# Patient Record
Sex: Female | Born: 1965
Health system: Southern US, Community
[De-identification: ages and names within clinical notes are randomized; demographics above are authoritative.]

## PROBLEM LIST (undated history)

## (undated) DIAGNOSIS — N189 Chronic kidney disease, unspecified: Secondary | ICD-10-CM

## (undated) DIAGNOSIS — D219 Benign neoplasm of connective and other soft tissue, unspecified: Secondary | ICD-10-CM

## (undated) DIAGNOSIS — M1990 Unspecified osteoarthritis, unspecified site: Secondary | ICD-10-CM

## (undated) DIAGNOSIS — E559 Vitamin D deficiency, unspecified: Secondary | ICD-10-CM

## (undated) DIAGNOSIS — O039 Complete or unspecified spontaneous abortion without complication: Secondary | ICD-10-CM

## (undated) HISTORY — DX: Vitamin D deficiency, unspecified: E55.9

## (undated) HISTORY — DX: Unspecified osteoarthritis, unspecified site: M19.90

## (undated) HISTORY — DX: Benign neoplasm of connective and other soft tissue, unspecified: D21.9

## (undated) HISTORY — DX: Chronic kidney disease, unspecified: N18.9

## (undated) HISTORY — DX: Complete or unspecified spontaneous abortion without complication: O03.9

---

## 2000-07-06 ENCOUNTER — Encounter: Admission: RE | Admit: 2000-07-06 | Discharge: 2000-07-06 | Payer: Self-pay | Admitting: Family Medicine

## 2000-08-20 ENCOUNTER — Encounter: Admission: RE | Admit: 2000-08-20 | Discharge: 2000-08-20 | Payer: Self-pay | Admitting: Family Medicine

## 2001-06-09 ENCOUNTER — Encounter: Admission: RE | Admit: 2001-06-09 | Discharge: 2001-06-09 | Payer: Self-pay | Admitting: Family Medicine

## 2004-07-05 ENCOUNTER — Ambulatory Visit: Payer: Self-pay | Admitting: Family Medicine

## 2004-09-19 ENCOUNTER — Ambulatory Visit: Payer: Self-pay | Admitting: Family Medicine

## 2004-10-25 ENCOUNTER — Ambulatory Visit: Payer: Self-pay | Admitting: Family Medicine

## 2007-03-19 ENCOUNTER — Other Ambulatory Visit: Admission: RE | Admit: 2007-03-19 | Discharge: 2007-03-19 | Payer: Self-pay | Admitting: Gynecology

## 2008-04-18 ENCOUNTER — Other Ambulatory Visit: Admission: RE | Admit: 2008-04-18 | Discharge: 2008-04-18 | Payer: Self-pay | Admitting: Gynecology

## 2009-04-20 ENCOUNTER — Encounter: Admission: RE | Admit: 2009-04-20 | Discharge: 2009-04-20 | Payer: Self-pay | Admitting: Gynecology

## 2009-10-11 ENCOUNTER — Ambulatory Visit: Payer: Self-pay | Admitting: Gynecology

## 2009-10-11 ENCOUNTER — Other Ambulatory Visit: Admission: RE | Admit: 2009-10-11 | Discharge: 2009-10-11 | Payer: Self-pay | Admitting: Gynecology

## 2010-05-23 ENCOUNTER — Encounter: Admission: RE | Admit: 2010-05-23 | Discharge: 2010-05-23 | Payer: Self-pay | Admitting: Gynecology

## 2010-10-17 ENCOUNTER — Encounter: Payer: Self-pay | Admitting: Gynecology

## 2010-10-17 ENCOUNTER — Other Ambulatory Visit: Payer: Self-pay | Admitting: Gynecology

## 2010-10-17 ENCOUNTER — Other Ambulatory Visit: Payer: BC Managed Care – PPO

## 2010-10-17 ENCOUNTER — Other Ambulatory Visit (HOSPITAL_COMMUNITY)
Admission: RE | Admit: 2010-10-17 | Discharge: 2010-10-17 | Disposition: A | Discharge status: Home / Self Care | Payer: BC Managed Care – PPO | Source: Ambulatory Visit | Attending: Gynecology | Admitting: Gynecology

## 2010-10-17 ENCOUNTER — Encounter (INDEPENDENT_AMBULATORY_CARE_PROVIDER_SITE_OTHER): Payer: BC Managed Care – PPO | Admitting: Gynecology

## 2010-10-17 DIAGNOSIS — R635 Abnormal weight gain: Secondary | ICD-10-CM

## 2010-10-17 DIAGNOSIS — D259 Leiomyoma of uterus, unspecified: Secondary | ICD-10-CM

## 2010-10-17 DIAGNOSIS — Z833 Family history of diabetes mellitus: Secondary | ICD-10-CM

## 2010-10-17 DIAGNOSIS — Z124 Encounter for screening for malignant neoplasm of cervix: Secondary | ICD-10-CM | POA: Insufficient documentation

## 2010-10-17 DIAGNOSIS — Z13220 Encounter for screening for lipoid disorders: Secondary | ICD-10-CM

## 2010-10-17 DIAGNOSIS — Z01419 Encounter for gynecological examination (general) (routine) without abnormal findings: Secondary | ICD-10-CM

## 2011-03-28 ENCOUNTER — Ambulatory Visit (INDEPENDENT_AMBULATORY_CARE_PROVIDER_SITE_OTHER): Payer: BC Managed Care – PPO | Admitting: Gynecology

## 2011-03-28 ENCOUNTER — Encounter: Payer: Self-pay | Admitting: Anesthesiology

## 2011-03-28 ENCOUNTER — Encounter: Payer: Self-pay | Admitting: Gynecology

## 2011-03-28 VITALS — BP 118/70

## 2011-03-28 DIAGNOSIS — B373 Candidiasis of vulva and vagina: Secondary | ICD-10-CM

## 2011-03-28 DIAGNOSIS — Z113 Encounter for screening for infections with a predominantly sexual mode of transmission: Secondary | ICD-10-CM

## 2011-03-28 DIAGNOSIS — R300 Dysuria: Secondary | ICD-10-CM

## 2011-03-28 DIAGNOSIS — R82998 Other abnormal findings in urine: Secondary | ICD-10-CM

## 2011-03-28 DIAGNOSIS — N390 Urinary tract infection, site not specified: Secondary | ICD-10-CM | POA: Insufficient documentation

## 2011-03-28 MED ORDER — FLUCONAZOLE 150 MG PO TABS: 150.0000 mg | ORAL_TABLET | Freq: Once | ORAL | Status: AC

## 2011-03-28 MED ORDER — NITROFURANTOIN MONOHYD MACRO 100 MG PO CAPS: 100.0000 mg | ORAL_CAPSULE | Freq: Two times a day (BID) | ORAL | Status: AC

## 2011-03-28 NOTE — Progress Notes (Signed)
The patient presented to the office today for complaining of thickened at 2-3 day history of urinary dysuria but no true frequency she denied any fever chills nausea or vomiting. She also has some concerns about her being exposed sexually to her husband who travels a lot and wanted tablets STD screening.  Abdominal exam: Soft slight suprapubic tenderness Pelvic: Bartholin urethra Skene was within normal limits Vagina: No gross lesions on inspection Cervix: No gross lesions on inspection Bimanual exam: Irregular shaped uterus with a subserosal myoma on the right uterine sidewall which had been confirmed on ultrasound 10/17/2010 Rectal exam: Not done  Urinalysis demonstrated 46 WBC 2+ bacteria in 6-8 RBC and her wet prep demonstrated evidence of moniliasis.  GC and chlamydia culture pending at time of this dictation  Prescription for Macrobid 1 tablet by mouth twice a day for 7 days and also samples of uribell 1 tablet 4 times a day for 2 days as an anti-spasmodic agent was provided. And a prescription for Diflucan 150 mg one tablet by mouth today.

## 2011-03-28 NOTE — Patient Instructions (Addendum)
Tomar Uribel de 3-4 veces al dai. El antibiotico Macrobid te tomas [employer] veces al dia por una semana. Si no te llamamos el lunes quiere decir que los cultivos estaban negativo. La tableta de diflucam te tomas una hoy para el hongo.

## 2011-03-28 NOTE — Progress Notes (Signed)
Addended by: Cammie Mcgee T on: 03/28/2011 04:35 PM   Modules accepted: Orders

## 2011-04-04 ENCOUNTER — Ambulatory Visit (INDEPENDENT_AMBULATORY_CARE_PROVIDER_SITE_OTHER): Payer: BC Managed Care – PPO | Admitting: Gynecology

## 2011-04-04 ENCOUNTER — Encounter: Payer: Self-pay | Admitting: Gynecology

## 2011-04-04 VITALS — BP 118/70

## 2011-04-04 DIAGNOSIS — R300 Dysuria: Secondary | ICD-10-CM

## 2011-04-04 NOTE — Progress Notes (Signed)
The patient presented in the office today for a test of cure after her urinary tract infection which was diagnosed at her last office visit she had been placed on Macrobid 1 tablet twice a day for 7 day course and had been given a Uribel as an anti-spasmodic agent 1 tablet 4 times a day for 2 days. She has one day left of the antibiotic which demonstrated her to finish she is almost completely asymptomatic her urinalysis today was negative and she is otherwise scheduled to return back in February the next year for her annual exam or when necessary. She was instructed also to schedule her mammogram which is due next month. Instructions were provided in Spanish.

## 2011-04-04 NOTE — Patient Instructions (Signed)
Recuerdate de hacer cita para la mammografia. Nos vemos en Feb 2013 para examen annual.

## 2011-04-22 ENCOUNTER — Other Ambulatory Visit: Payer: Self-pay | Admitting: Gynecology

## 2011-04-22 DIAGNOSIS — Z1231 Encounter for screening mammogram for malignant neoplasm of breast: Secondary | ICD-10-CM

## 2011-04-23 ENCOUNTER — Encounter: Payer: Self-pay | Admitting: Gynecology

## 2011-04-23 ENCOUNTER — Ambulatory Visit (INDEPENDENT_AMBULATORY_CARE_PROVIDER_SITE_OTHER): Payer: BC Managed Care – PPO | Admitting: Gynecology

## 2011-04-23 VITALS — BP 134/80

## 2011-04-23 DIAGNOSIS — Z309 Encounter for contraceptive management, unspecified: Secondary | ICD-10-CM

## 2011-04-23 NOTE — Progress Notes (Signed)
Patient is a 45 year old gravida 3 para 2 AB 1 who presented to the office today for discussion of different contraceptive options. She had been using the rhythm method but since her cycles are occurring anywhere between 21-35 days she feels uncomfortable and is afraid of getting pregnant. Her Pap smear was in February of this year which was normal.  We went through an extensive discussion on the different methods of contraception to include the following: Nexplanon, Depo-Provera injection, NuvaRing, oral contraceptive pills and the 2 types of IUDs. The risks benefits and pros and cons of each were discussed in detail. Patient is in a monogamous relationship and would like to proceed with a Mirena IUD which she had many years ago. Literature information was provided we'll order one for her and she will call the office as soon as her period starts so we can insert it. All the above was discussed with the patient Spanish in detail all questions were answered and we'll follow accordingly.

## 2011-04-29 ENCOUNTER — Telehealth: Payer: Self-pay | Admitting: Anesthesiology

## 2011-04-29 NOTE — Telephone Encounter (Signed)
Left message

## 2011-05-01 NOTE — Telephone Encounter (Signed)
Dr. Lily Peer I spoke with patient and informed her that her insurance did not cover the mirena iud.Marland Kitchen i told her that you could Rx her a pill, she does not like the pill and said she discussed Depo Provera with you and would prefer that.Marland Kitchen

## 2011-05-01 NOTE — Telephone Encounter (Signed)
Autumn Haley, please notify patient that I have reviewed her record and we did discuss about the Depo-Provera injectable contraception and other options as well. Since she has decided to proceed with the Depo-Provera injectable contraception we'll go ahead and have her come to the office when she starts her period. Have her call the office with her period starts so we can proceed and given of the Depo-Provera injectable contraception which is 150 mg administered IM every 3 months.

## 2011-05-30 ENCOUNTER — Ambulatory Visit
Admission: RE | Admit: 2011-05-30 | Discharge: 2011-05-30 | Disposition: A | Discharge status: Home / Self Care | Payer: BC Managed Care – PPO | Source: Ambulatory Visit | Attending: Gynecology | Admitting: Gynecology

## 2011-05-30 DIAGNOSIS — Z1231 Encounter for screening mammogram for malignant neoplasm of breast: Secondary | ICD-10-CM

## 2011-06-06 ENCOUNTER — Other Ambulatory Visit: Payer: Self-pay | Admitting: *Deleted

## 2011-06-06 ENCOUNTER — Ambulatory Visit: Payer: BC Managed Care – PPO

## 2011-06-06 DIAGNOSIS — IMO0001 Reserved for inherently not codable concepts without codable children: Secondary | ICD-10-CM

## 2011-06-06 MED ORDER — MEDROXYPROGESTERONE ACETATE 150 MG/ML IM SUSP: 150.0000 mg | Freq: Once | INTRAMUSCULAR | Status: DC

## 2011-07-04 ENCOUNTER — Ambulatory Visit (INDEPENDENT_AMBULATORY_CARE_PROVIDER_SITE_OTHER): Payer: BC Managed Care – PPO | Admitting: Anesthesiology

## 2011-07-04 ENCOUNTER — Encounter: Payer: Self-pay | Admitting: Anesthesiology

## 2011-07-04 DIAGNOSIS — Z309 Encounter for contraceptive management, unspecified: Secondary | ICD-10-CM

## 2011-07-04 MED ORDER — MEDROXYPROGESTERONE ACETATE 150 MG/ML IM SUSP
150.0000 mg | Freq: Once | INTRAMUSCULAR | Status: AC
  Administered 2011-07-04: 150 mg via INTRAMUSCULAR

## 2011-08-08 ENCOUNTER — Ambulatory Visit (INDEPENDENT_AMBULATORY_CARE_PROVIDER_SITE_OTHER): Payer: BC Managed Care – PPO

## 2011-08-08 DIAGNOSIS — B373 Candidiasis of vulva and vagina: Secondary | ICD-10-CM

## 2011-08-28 ENCOUNTER — Other Ambulatory Visit: Payer: Self-pay | Admitting: Gynecology

## 2011-08-28 ENCOUNTER — Ambulatory Visit (INDEPENDENT_AMBULATORY_CARE_PROVIDER_SITE_OTHER): Payer: BC Managed Care – PPO | Admitting: Gynecology

## 2011-08-28 ENCOUNTER — Encounter: Payer: Self-pay | Admitting: Gynecology

## 2011-08-28 VITALS — BP 120/82

## 2011-08-28 DIAGNOSIS — D259 Leiomyoma of uterus, unspecified: Secondary | ICD-10-CM

## 2011-08-28 DIAGNOSIS — D219 Benign neoplasm of connective and other soft tissue, unspecified: Secondary | ICD-10-CM

## 2011-08-28 DIAGNOSIS — R300 Dysuria: Secondary | ICD-10-CM

## 2011-08-28 LAB — URINALYSIS, ROUTINE W REFLEX MICROSCOPIC
Bilirubin Urine: NEGATIVE
Ketones, ur: NEGATIVE mg/dL
Nitrite: NEGATIVE
Protein, ur: NEGATIVE mg/dL
Specific Gravity, Urine: 1.02 (ref 1.005–1.030)
Urobilinogen, UA: 0.2 mg/dL (ref 0.0–1.0)

## 2011-08-28 LAB — URINALYSIS, MICROSCOPIC ONLY
Bacteria, UA: NONE SEEN
Crystals: NONE SEEN
RBC / HPF: NONE SEEN RBC/hpf (ref <3)

## 2011-08-28 NOTE — Patient Instructions (Signed)
La semana que viene hacer ultrasonido. Mandamos cultivo hoy.

## 2011-08-28 NOTE — Progress Notes (Signed)
Patient presented to the office today stating that we could go she was at the urgent care and was treated for suspected urinary tract infection with Macrobid for one week. She denies any fever chills nausea or vomiting she states discomfort is right when she finishes voiding regardless she wipes herself were not. She is in a monogamous relationship has not had intercourse for 2 weeks. She denies any frequency.  Urinalysis today was negative with the exception of trace blood in her urine. We will send a urine for culture.  Patient with history of fibroid uterus February of last year her ultrasound demonstrated she had a uterus that measured 11 x 6.5 x 5 cm and had several intramural myomas and a large subserosal myoma measuring 6.6 x 5.0 x 5.8 cm ovaries are otherwise normal  Pelvic: Bartholin urethra Skene glands within normal limits Vagina: No gross lesions on inspection Cervix: No gross lesions on inspection Uterus: 12 weeks size irregularities attributed to her fibroid uterus more on the right pelvic sidewall region. Adnexa difficult to assess due to the size of the fibroids.  Assessment: Micro hematuria recently treated for urinary tract infection urethral tenderness for/irritation at termination of voiding. History of fibroid uterus irregular right adnexal region on exam today. Patient will return back next week for ultrasound. We'll send a urine for culture today. Samples of Uribell anti-spasmodic was provided her to take 1 tablet 3 times a day for 3 days. If her symptoms continue she returned back next week and her fibroid uterus in stable she'll be referred to the urologist for possible cystoscopic evaluation.

## 2011-08-30 LAB — URINE CULTURE
Colony Count: NO GROWTH
Organism ID, Bacteria: NO GROWTH

## 2011-09-03 NOTE — Progress Notes (Signed)
Addended by: Bertram Savin A on: 09/03/2011 04:38 PM   Modules accepted: Orders

## 2011-09-05 ENCOUNTER — Ambulatory Visit: Payer: BC Managed Care – PPO | Admitting: Gynecology

## 2011-09-05 ENCOUNTER — Ambulatory Visit (INDEPENDENT_AMBULATORY_CARE_PROVIDER_SITE_OTHER): Payer: BC Managed Care – PPO | Admitting: Gynecology

## 2011-09-05 ENCOUNTER — Ambulatory Visit (INDEPENDENT_AMBULATORY_CARE_PROVIDER_SITE_OTHER): Payer: BC Managed Care – PPO

## 2011-09-05 ENCOUNTER — Other Ambulatory Visit: Payer: BC Managed Care – PPO

## 2011-09-05 ENCOUNTER — Encounter: Payer: Self-pay | Admitting: Gynecology

## 2011-09-05 VITALS — BP 132/86

## 2011-09-05 DIAGNOSIS — IMO0001 Reserved for inherently not codable concepts without codable children: Secondary | ICD-10-CM

## 2011-09-05 DIAGNOSIS — N760 Acute vaginitis: Secondary | ICD-10-CM

## 2011-09-05 DIAGNOSIS — Z309 Encounter for contraceptive management, unspecified: Secondary | ICD-10-CM

## 2011-09-05 DIAGNOSIS — D259 Leiomyoma of uterus, unspecified: Secondary | ICD-10-CM

## 2011-09-05 DIAGNOSIS — D219 Benign neoplasm of connective and other soft tissue, unspecified: Secondary | ICD-10-CM

## 2011-09-05 DIAGNOSIS — R300 Dysuria: Secondary | ICD-10-CM

## 2011-09-05 LAB — URINALYSIS W MICROSCOPIC + REFLEX CULTURE
Glucose, UA: NEGATIVE mg/dL
Hgb urine dipstick: NEGATIVE
Leukocytes, UA: NEGATIVE
Nitrite: NEGATIVE
Protein, ur: NEGATIVE mg/dL
Urobilinogen, UA: 0.2 mg/dL (ref 0.0–1.0)

## 2011-09-05 MED ORDER — NORETHIN ACE-ETH ESTRAD-FE 1-20 MG-MCG PO TABS: 1.0000 | ORAL_TABLET | Freq: Every day | ORAL | Status: DC

## 2011-09-05 NOTE — Progress Notes (Signed)
Patient presented to the office today for discussion of her ultrasound. Patient was seen in the office on January 3 she had been evaluated and treated in the urgent care for suspected urinary tract infection. She continued to have discomfort at the termination of urination but no dysuria per say and some irritation the outside of the urethra. She is in a monogamous relationship. Recent urinalysis and urine culture in the office were negative during exam there was a questionable irregularity in the right adnexa so she was asked to come back today for an ultrasound and plan a course of management.  Ultrasound demonstrated uterus measured 11.5 x 10.8 x 7.7 cm endometrial stripe 7.3 mm. For small fibroids less than 2 half centimeters in size was noted. Ovaries otherwise normal.  No disposable recent for patient's symptoms with the exception of vaginitis attributed to the Depo-Provera. We are going to place her on Premarin cream to apply to her finger and applied to the distal urethra and every night for the next 2 weeks. We'll switch her over to low dose oral contraceptive pill starting with her next cycle. Risk benefits and pros and cons were discussed. Patient denied any history of bleeding disorders in her family. If her symptoms continue after this changes we'll refer the urologist for cystoscopic evaluation. Patient is interested in the IUD is to wait and she states a minute because it would be a pocket since her insurance wouldn't cover. Literature information on the above was provided in Spanish all questions were answered and we'll follow accordingly.

## 2011-09-05 NOTE — Patient Instructions (Signed)
Usar crema de premarin todas las noches un poquito en el dedo haste que se te acabe. Esperar que [employer] periodo proximo para Corporate investment banker la pastilla anticonceptiva (empezar el segundo dia del periodo). Tomar pastilla a la [employer]  .Anticonceptivos orales (Oral Contraceptives) Los anticonceptivos orales son medicamentos que se utilizan para Location manager. Es el mtodo reversible ms ampliamente utilizado. Su funcin es [employer] ovarios liberen vulos. Las hormonas de los anticonceptivos orales hacen que el moco cervical se haga ms espeso, lo que evita que el esperma ingrese al tero. Tambin hacen que la membrana que tapiza el tero se vuelva ms fina, lo que no permite que el huevo fertilizado se adhiera a la pared del tero. Cuando se toman exactamente como se prescriben, el porcentaje de fracaso es entre el 1% y el 3%  menos. HAY DOS TIPOS DE ANTICONCEPTIVOS [employer]  Los que contienen una mezcla de estrgenos y progesterona son los ms comunes. Se toman durante 21 das seguidos y se suspenden por 7 [address]  [address]. Vienen en envases de 28 pldoras, y 7 de ellas son incactivas. Debe tomar [place] da. Por lo tanto no necesita recordar cundo Tax adviser a comenzar a Hydrologist. La mayora de las mujeres tendrn su perodo menstrual 2  3 das despus de tomar la pldora de hormonas. El perodo menstrual generalmente se hace menos abundante y ms breve. No debe tomarlos si est amamantando.   Los anticonceptivos que slo contienen progesterona (minipldoras) no contienen estrgenos. Deben tomarse [employer]. Podr ser que el perodo menstrual sea de slo una pequea mancha o no tenga perodo en absoluto.. Si est amamantando podr tomar aquellas pldoras anticonceptivas que contengan slo progesterona.  Los anticonceptivos orales se presentan en:  Envases de 21 pldoras, sin pldoras inactivas para tomar [employer] 7 [employer] Disney.   Envases de 28 pldoras, para  tomar Management consultant. Las ltimas 7 no contienen hormonas.   Envases de 91 pldoras (uso continuo o extendido) para tomar una pldora por da. Las primeras 84 contienen hormonas y las 7 ltimas no contienen. En este momento tendr su perodo menstrual. No tendr su perodo durante el tiempo en que tome las primeras 84 pldoras.  COMO TOMAR LOS ANTICONCEPTIVOS ORALES El mdico le indicar como comenzar a Surveyor, minerals ciclo de anticonceptivos orales. De lo contrario usted puede:  Comenzar con Company secretary de anticonceptivos Architect 1 o el da 5 de su ciclo menstrual, No necesitar proteccin anticonceptiva adicional al Investment banker, operational.   Comience el primer domingo, o el da 7 luego de su perodo menstrual, o Medical laboratory scientific officer en que adquiere el [employer]. En estos casos deber [employer] proteccin anticonceptiva adicional durante Tax inspector.  No importa cuando comience a tomar los anticonceptivos, siempre empiece un nuevo envase el mismo da de la [place]. Es Neomia Dear buena idea tener un envase extra de pldoras anticonceptivas y un anticonceptivo adicional para el caso en que se olvide de tomar algunas pldoras o pierda la caja. MOTIVOS FRECUENTES PARA EL FRACASO  Olvido de tomar la [employer] a la misma hora.   Escasa absorcin del medicamento desde el estmago hacia el torrente sanguneo. El motivo puede ser que haya sufrido diarrea, vmitos o el uso de algunos medicamentos para combatir grmenes (antibiticos).   Trastornos estomacales o intestinales.   El consumo de la pldora con otros medicamentos que pueden disminuir su efectividad, como [place], [place],  fenobarbital y rifampicina.   Uso de anticonceptivos que han pasado su fecha de vencimiento.   Cuando se [place] el envase de [place], se olvida recomenzar el uso en el da 7.  Si se olvida de tomar [employer], tmela tan pronto como lo recuerde, y tome la siguiente en el momento correspondiente. Si olvida  tomar 2  ms pldoras, utilice un mtodo anticonceptivo adicional cuando comience su prximo perodo menstrual. Adems, puede tener una pequea prdida de sangre o hemorragia si olvida tomar 2  ms pldoras. Si utiliza el envase de 28  91 pldoras y [place] tomar 1 de las ltimas 7 (pldoras sin hormonas), no tiene Quarry manager. Simplemente deseche el resto de las pldoras que no contienen hormonas y comience un nuevo envase de 28  91 pldoras. USOS COMUNES DE LOS ANTICONCEPTIVOS ORALES  Disminucin de los sntomas (problemas) premenstruales.   Alivio de los clicos durante el perodo menstrual.   Evitar un Psychiatrist.   Regulacin del ciclo menstrual.   Tratamiento para el acn.   Disminuyen el flujo menstrual excesivo.   Tratamiento de las hemorragias uterinas disfuncionales (anormales).   Dolor plvico crnico.   Tratamiento del sndrome del ovario poliqustico (el ovario no produce vulos y forma pequeos quistes).   Tratamiento de la endometriosis (tejido que tapiza internamente al tero ubicado patolgicamente en la pelvis, los ovarios, tero y trompas).   Los anticonceptivos orales pueden utilizarse como anticonceptivos de Associate Professor.  Los anticonceptivos orales NO previenen contra las enfermedades de trasmisin sexual. La prctica del sexo seguro, como el uso de preservativos, junto con la pldora, [place] a prevenir ese tipo de enfermedades.  BENEFICIOS  Reducen el riesgo de:   Cncer de ovario y tero.   Quiste ovrico   Infecciones plvicas   Sntomas del sndrome ovrico poliqustico   Prdida de masa sea (osteoporosis).   Enfermedades mamarias no cancerosas (benignas) .   Dficit de glbulos rojos (anemia) debido a perodos menstruales abundantes o prolongados.   Embarazo ectpico (embarazo fuera del tero).   Acn.   Disminuye el flujo de los perodos abundantes.   En algunos casos mejora el sndrome premenstrual.   Alivia los clicos y Chief Technology Officer.    Controla los perodos [employer].   Pueden usarse como anticonceptivos de emergencia  USTED NO DEBE TOMAR LA PLDORA SI:  Quiere quedar embarazada o lo est.   Usted presenta una hemorragia vaginal que no es normal.   Tiene una historia de enfermedad heptica, ictus o ataque cardaco.   Fuma   Tiene una historia de problemas de coagulacin, cncer o problemas cardacos.   Sufre una enfermedad en la vescula biliar.   Sufre cncer de mama o sospecha tenerlo.   Sufre o sospecha tener cncer plvico.   Tiene la presin arterial elevada.   Tiene niveles elevados de colesterol o triglicridos.   Sufre depresin.   Est amamantando, excepto en el caso que tome anticonceptivos que contengan slo progesterona, con aprobacin del mdico.   Sufre diabetes y tiene complicaciones renales, oculares o en los vasos sanguneos. O si ha tenido diabetes durante 20 aos o ms.   Tiene una enfermedad en las vlvulas cardacas.   Tiene cefaleas migraosas Pueden empeorar.  Antes de tomar la pldora, toda mujer debe hacerse un examen fsico y un [place]. El Ecologist indicar anlisis de sangre para Sales executive nivel de azcar en la sangre, colesterol y otros anlisis de sangre que puedan ser necesarios. ENTRE LOS EFECTOS ADVERSOS DE LA PLDORA SE  INCLUYEN:  Sensibilidad, secrecin y [employer].   Modificaciones en el impulso sexual (aumento o disminucin de la libido).   Depresin.   Ms cansancio.   Dolor de [place].   Ansiedad.   [place] irregular o hemorragia vaginal durante algunos meses.   Dolor en las piernas.   Calambres o hinchazn de los miembros (extremidades).   Trastornos del [place] de nimo.   Prdida o aumento de [place].   Ganas de vomitar (nuseas).   Modificaciones en el apetito.   Prdida del cabello   Infeccin vaginal por hongos   Nerviosismo   Erupcin   Acn   Falta de perodo menstrual (amenorrea)  Al comenzar  a Psychologist, occupational, es Agricultural consultant o tres meses para que el organismo se adapte (antes de suspender el uso debido a Architectural technologist secundario). Esto permite adaptarse a los [employer]. Si una mujer contina con los efectos secundarios, podr Multimedia programmer a otro tipo de anticonceptivos orales. Es importante que le comente los trastornos al profesional que la asiste. Con frecuencia, el cambio a una pldora diferente hace que los efectos secundarios disminuyan. RIESGOS Y COMPLICACIONES  Cogulos en las piernas, corazn, pulmones o cerebro.   Presin arterial elevada.   Enfermedad en la vescula biliar.   Cncer de hgado   Sangrado cerebral (Hemorragias )   Ligero aumento en el riesgo de sufrir cncer de mama.  INSTRUCCIONES PARA EL CUIDADO DOMICILIARIO  No fume.   Utilice los medicamentos de venta libre o de prescripcin para Chief Technology Officer, Environmental health practitioner o molestias en las mamas segn se lo indique el mdico.   Siempre use un condn para protegerse de las enfermedades de transmisin sexual. Los anticonceptivos orales no protegen contra las enfermedades de transmisin sexual.   Research scientist (medical) en un calendario las fechas en las que tiene sus perodos [employer].  Las indicaciones, los tipos y las dosis varan continuamente. Comente sus elecciones al mdico y decida qu es lo mejor para usted. Siempre hay excepciones a las normas. Lea siempre la informacin que viene en el envase y controle si hay nuevas recomendaciones o normas. SOLICITE ATENCIN MDICA SI:  Presenta nuseas o vmitos.   Brett Fairy hemorragia vaginal anormal.   Necesita tratamiento por sus cefaleas.   Aparece una erupcin cutnea.   No tiene perodo menstrual.   Presenta hemorragia vaginal anormal.   Pierde el cabello.   Necesita tratamiento para los cambios en el estado de nimo o la depresin.   Se marea al tomarlos.   Comienza a aparecer acn.  SOLICITE ATENCIN MDICA DE  INMEDIATO SI:  Siente dolor en las piernas.   Siente dolor en el pecho.   Le falta el aire.   Presenta dolor abdominal.   Le duele la cabeza y no se calma.   Presenta adormecimiento o dificultad en el habla.   Tiene problemas para ver, (prdida de la visin, visin borrosa, o visin doble).   Presenta una hemorragia vaginal abundante.  Si est tomando la pldora, SUSPNDALA INMEDIATAMENTE y COMUNQUESE CON SU MDICO si sufre alguna de las siguientes situaciones:  Siente falta de aire o Journalist, newspaper.   Presenta dolor, inflamacin o hinchazn en las piernas.   Presenta dolor de cabeza intenso, trastornos visuales o dolor abdominal (en el vientre).   La depresin es intensa.   Est embarazada.  Document Released: 05/21/2005 Document Revised: 09/13/2010 [employer] Patient Information 2012 [place], [place].

## 2011-09-11 ENCOUNTER — Other Ambulatory Visit: Payer: Self-pay

## 2011-09-11 DIAGNOSIS — R3989 Other symptoms and signs involving the genitourinary system: Secondary | ICD-10-CM

## 2011-09-17 ENCOUNTER — Other Ambulatory Visit: Payer: BC Managed Care – PPO

## 2011-09-17 DIAGNOSIS — R3989 Other symptoms and signs involving the genitourinary system: Secondary | ICD-10-CM

## 2011-09-17 LAB — URINALYSIS W MICROSCOPIC + REFLEX CULTURE
Casts: NONE SEEN
Ketones, ur: NEGATIVE mg/dL
Leukocytes, UA: NEGATIVE
Nitrite: NEGATIVE
Protein, ur: NEGATIVE mg/dL
Urobilinogen, UA: 0.2 mg/dL (ref 0.0–1.0)
pH: 5.5 (ref 5.0–8.0)

## 2011-10-23 ENCOUNTER — Ambulatory Visit (INDEPENDENT_AMBULATORY_CARE_PROVIDER_SITE_OTHER): Payer: BC Managed Care – PPO | Admitting: Gynecology

## 2011-10-23 ENCOUNTER — Encounter: Payer: Self-pay | Admitting: Gynecology

## 2011-10-23 VITALS — BP 146/82 | Ht 60.25 in | Wt 136.0 lb

## 2011-10-23 DIAGNOSIS — Z01419 Encounter for gynecological examination (general) (routine) without abnormal findings: Secondary | ICD-10-CM

## 2011-10-23 DIAGNOSIS — Z3049 Encounter for surveillance of other contraceptives: Secondary | ICD-10-CM

## 2011-10-23 DIAGNOSIS — N912 Amenorrhea, unspecified: Secondary | ICD-10-CM

## 2011-10-23 DIAGNOSIS — Z0000 Encounter for general adult medical examination without abnormal findings: Secondary | ICD-10-CM

## 2011-10-23 DIAGNOSIS — R635 Abnormal weight gain: Secondary | ICD-10-CM

## 2011-10-23 DIAGNOSIS — Z3042 Encounter for surveillance of injectable contraceptive: Secondary | ICD-10-CM

## 2011-10-23 LAB — LIPID PANEL
HDL: 55 mg/dL (ref >39)
LDL Cholesterol: 96 mg/dL (ref 0–99)
Triglycerides: 59 mg/dL (ref <150)

## 2011-10-23 LAB — CBC WITH DIFFERENTIAL/PLATELET
HCT: 42.5 % (ref 36.0–46.0)
Hemoglobin: 14 g/dL (ref 12.0–15.0)
Lymphs Abs: 2.3 10*3/uL (ref 0.7–4.0)
MCH: 29.7 pg (ref 26.0–34.0)
Monocytes Absolute: 0.9 10*3/uL (ref 0.1–1.0)
Monocytes Relative: 11 % (ref 3–12)
Neutro Abs: 5.3 10*3/uL (ref 1.7–7.7)
Neutrophils Relative %: 61 % (ref 43–77)
RBC: 4.72 MIL/uL (ref 3.87–5.11)

## 2011-10-23 LAB — PREGNANCY, URINE: Preg Test, Ur: NEGATIVE

## 2011-10-23 LAB — TSH: TSH: 1.225 u[IU]/mL (ref 0.350–4.500)

## 2011-10-23 LAB — GLUCOSE, RANDOM: Glucose, Bld: 103 mg/dL — ABNORMAL HIGH (ref 70–99)

## 2011-10-23 MED ORDER — MEDROXYPROGESTERONE ACETATE 150 MG/ML IM SUSP
150.0000 mg | Freq: Once | INTRAMUSCULAR | Status: AC
  Administered 2011-10-23: 150 mg via INTRAMUSCULAR

## 2011-10-23 MED ORDER — MEDROXYPROGESTERONE ACETATE 150 MG/ML IM SUSP: 150.0000 mg | INTRAMUSCULAR | Status: DC

## 2011-10-23 NOTE — Progress Notes (Signed)
Addended by: Bertram Savin A on: 10/23/2011 11:05 AM   Modules accepted: Orders

## 2011-10-23 NOTE — Patient Instructions (Signed)
Inyeccion de Provera al final de [employer]

## 2011-10-23 NOTE — Progress Notes (Signed)
Autumn Haley 01/16/1966 [id number]   History:    46 y.o.  for annual exam with only complaint today of not having a menstrual cycle this month but had one twice in January. She received her first Depo-Provera injection November of 2012 and is scheduled for next dose today. She had done several urine pregnancy test at home which were negative. Pregnancy test in the office today was negative as well. She denied nausea or vomiting we headaches or nipple discharge. Patient does her monthly self breast examination her last mammogram was in August of 2012. Last Pap smear was in 2012 which was normal. No prior history of abnormal Pap smears. Patient with known history of fibroid uterus. Ultrasound January 2013 as follows:  Uterus 11.5 x 10.8 x 7.7 cm with multiple fibroids 1 measuring 6.8 x 5.8 cm and the remainder were less than 2 cm. Ovaries were normal. Ultrasound done January 2013.  Past medical history,surgical history, family history and social history were all reviewed and documented in the EPIC chart.  Gynecologic History No LMP recorded. Patient has had an injection. Contraception: Depo-Provera injections Last Pap: 2012. Results were: normal Last mammogram: 2012. Results were:normal}  Obstetric History OB History    Grav Para Term Preterm Abortions TAB SAB Ect Mult Living   3 2 2  1  1   2      # Outc Date GA Lbr Len/2nd Wgt Sex Del Anes PTL Lv   1 TRM     M SVD  No Yes   2 TRM     F SVD  No Yes   3 SAB                ROS:  Was performed and pertinent positives and negatives are included in the history.  Exam: chaperone present  BP 146/82  Ht 5' 0.25" (1.53 m)  Wt 136 lb (61.689 kg)  BMI 26.34 kg/m2  Body mass index is 26.34 kg/(m^2).  General appearance : Well developed well nourished female. No acute distress HEENT: Neck supple, trachea midline, no carotid bruits, no thyroidmegaly Lungs: Clear to auscultation, no rhonchi or wheezes, or rib retractions  Heart:  Regular rate and rhythm, no murmurs or gallops Breast:Examined in sitting and supine position were symmetrical in appearance, no palpable masses or tenderness,  no skin retraction, no nipple inversion, no nipple discharge, no skin discoloration, no axillary or supraclavicular lymphadenopathy Abdomen: no palpable masses or tenderness, no rebound or guarding Extremities: no edema or skin discoloration or tenderness  Pelvic:  Bartholin, Urethra, Skene Glands: Within normal limits             Vagina: No gross lesions or discharge  Cervix: No gross lesions or discharge  Uterus  10-12 weeks size irregular, normal size, shape and consistency, non-tender and mobile  Adnexa  Without masses or tenderness  Anus and perineum  normal   Rectovaginal  normal sphincter tone without palpated masses or tenderness             Hemoccult not done     Assessment/Plan:  46 y.o. female for annual exam reassured that her pregnancy test was negative. She was reassured that her amenorrhea is attributed to her been on Depo-Provera and injectable contraception. She will receive 150 mg of Depo-Provera IM today and return every 3 months if she was to continue this form of contraception. The following labs were drawn today CBC, fasting lipid profile, TSH, urinalysis, along with a fasting blood sugar. New Pap smear  screening guidelines discussed. No Pap smear done today. Patient encouraged to do her monthly self breast examination. She was instructed to take her calcium vitamin D for osteoporosis prevention. She was reminded that in August of this year she needs her followup annual mammogram.    Ok Edwards MD, 10:54 AM 10/23/2011

## 2011-10-24 LAB — URINALYSIS W MICROSCOPIC + REFLEX CULTURE
Bilirubin Urine: NEGATIVE
Casts: NONE SEEN
Crystals: NONE SEEN
Glucose, UA: NEGATIVE mg/dL
Leukocytes, UA: NEGATIVE
pH: 6 (ref 5.0–8.0)

## 2011-11-20 ENCOUNTER — Ambulatory Visit: Payer: BC Managed Care – PPO | Admitting: Gynecology

## 2011-11-20 ENCOUNTER — Ambulatory Visit (INDEPENDENT_AMBULATORY_CARE_PROVIDER_SITE_OTHER): Payer: BC Managed Care – PPO | Admitting: Gynecology

## 2011-11-20 ENCOUNTER — Encounter: Payer: Self-pay | Admitting: Gynecology

## 2011-11-20 DIAGNOSIS — M1990 Unspecified osteoarthritis, unspecified site: Secondary | ICD-10-CM

## 2011-11-20 DIAGNOSIS — M129 Arthropathy, unspecified: Secondary | ICD-10-CM

## 2011-11-20 NOTE — Progress Notes (Signed)
Scheduling error patient did not need to be seen today. Her annual due next year

## 2011-11-30 ENCOUNTER — Ambulatory Visit (INDEPENDENT_AMBULATORY_CARE_PROVIDER_SITE_OTHER): Payer: BC Managed Care – PPO | Admitting: Family Medicine

## 2011-11-30 VITALS — BP 142/82 | HR 64 | Temp 98.4°F | Resp 16 | Ht 60.5 in | Wt 134.0 lb

## 2011-11-30 DIAGNOSIS — M79609 Pain in unspecified limb: Secondary | ICD-10-CM

## 2011-11-30 DIAGNOSIS — M1990 Unspecified osteoarthritis, unspecified site: Secondary | ICD-10-CM

## 2011-11-30 DIAGNOSIS — M79643 Pain in unspecified hand: Secondary | ICD-10-CM

## 2011-11-30 DIAGNOSIS — M129 Arthropathy, unspecified: Secondary | ICD-10-CM

## 2011-11-30 MED ORDER — MELOXICAM 7.5 MG PO TABS: 7.5000 mg | ORAL_TABLET | Freq: Two times a day (BID) | ORAL | Status: DC | PRN

## 2011-11-30 NOTE — Progress Notes (Signed)
  Subjective:    Patient ID: Autumn Haley, female    DOB: Jun 01, 1966, 46 y.o.   MRN: [id number]  HPI 46 yo female, heathy, with numb, tingling in both hands but not at same time.  Occasionly can go up into arms but right now only in hands.  No weakness.  Able to button, zip, etc.  NOt dropping things.  Works in [employer], buses tables.  Does do some heavy lifting at work.  NO recent injury or neck pain.  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  Primarily bothers her at night.  Affects all 5 fingers.  Forgets about it or doesn't notice it much during the day.  Tylenol PM does help some at night.    Review of Systems Negative except as per HPI     Objective:   Physical Exam  Constitutional: She appears well-developed.  Cardiovascular: Normal rate, regular rhythm and normal heart sounds.   Pulmonary/Chest: Effort normal and breath sounds normal.  Neurological: She is alert.   Hands normal-appearing.  No swelling or erythema.  FROM.  Full strength.  No focal tenderness now.  Negative phalen's/tinel's       Assessment & Plan:  Hand pain/paresthesias - doubt carpal tunnel given involvement of all 5 fingers.  Question arthritis.  Try Mobic 7.5 mg BID prn.  Take at night primarily.  Can take in AM if needed occasionally.  INB, return.

## 2011-12-26 ENCOUNTER — Ambulatory Visit (INDEPENDENT_AMBULATORY_CARE_PROVIDER_SITE_OTHER): Payer: BC Managed Care – PPO | Admitting: Gynecology

## 2011-12-26 ENCOUNTER — Encounter: Payer: Self-pay | Admitting: Gynecology

## 2011-12-26 VITALS — BP 122/88

## 2011-12-26 DIAGNOSIS — N946 Dysmenorrhea, unspecified: Secondary | ICD-10-CM

## 2011-12-26 DIAGNOSIS — D259 Leiomyoma of uterus, unspecified: Secondary | ICD-10-CM

## 2011-12-26 DIAGNOSIS — N921 Excessive and frequent menstruation with irregular cycle: Secondary | ICD-10-CM

## 2011-12-26 NOTE — Progress Notes (Signed)
Addended by: Ok Edwards on: 12/26/2011 03:12 PM   Modules accepted: Orders

## 2011-12-26 NOTE — Progress Notes (Signed)
Patient is a 46 year old was seen the office in February 28 of this year for her annual gynecological examination she is complaining she has not had a menstrual cycle in that month. She had been placed on Depo-Provera injection November 2012 and had her second dose in February. She had done several home urine pregnancy test which were negative. She presented to the office today stating that she's been bleeding continuously for the past 3 weeks. She is sexually active. Urine pregnancy in the office today was negative. She denied any nausea vomiting or breast tenderness. In January this year she had an ultrasound with the following findings:  Uterus 11.5 x 10.8 x 7.7 cm with multiple fibroids 1 measuring 6.8 x 5.8 cm and the remainder were less than 2 cm. Ovaries were normal. Ultrasound done January 2013  Exam: Bartholin urethra Skene glands: Within normal limits Vagina: Blood present in the vaginal vault Cervix: Blood clot present at the external os Uterus: Irregular shaped uterus 10-12 weeks size Adnexa: No palpable masses or tenderness Rectal: Not examined  Assessment/plan: Patient with breakthrough bleeding on Depo-Provera injection. She'll be given Megace 40 mg take 1 by mouth twice a day for 10 days. She will return back next week to the office for an endometrial biopsy and sonohysterogram to see if some of these fibers were encroaching into the uterine cavity. We had discussed that if the symptoms continue or does not improve we may need to consider proceeding with a laparoscopic hysterectomy with ovarian conservation. Her laboratory testing in February demonstrated she had a normal random blood sugar, normal competence metabolic panel, normal CBC, and negative urinalysis her last normal Pap smear was in February 2012. All the above instructions were discussed with the patient Spanish and we'll follow accordingly.

## 2012-01-07 ENCOUNTER — Other Ambulatory Visit: Payer: Self-pay | Admitting: Gynecology

## 2012-01-07 ENCOUNTER — Ambulatory Visit (INDEPENDENT_AMBULATORY_CARE_PROVIDER_SITE_OTHER): Payer: BC Managed Care – PPO | Admitting: Gynecology

## 2012-01-07 ENCOUNTER — Ambulatory Visit (INDEPENDENT_AMBULATORY_CARE_PROVIDER_SITE_OTHER): Payer: BC Managed Care – PPO

## 2012-01-07 DIAGNOSIS — N926 Irregular menstruation, unspecified: Secondary | ICD-10-CM

## 2012-01-07 DIAGNOSIS — D259 Leiomyoma of uterus, unspecified: Secondary | ICD-10-CM

## 2012-01-07 DIAGNOSIS — N946 Dysmenorrhea, unspecified: Secondary | ICD-10-CM

## 2012-01-07 DIAGNOSIS — N938 Other specified abnormal uterine and vaginal bleeding: Secondary | ICD-10-CM

## 2012-01-07 DIAGNOSIS — N921 Excessive and frequent menstruation with irregular cycle: Secondary | ICD-10-CM

## 2012-01-07 DIAGNOSIS — N939 Abnormal uterine and vaginal bleeding, unspecified: Secondary | ICD-10-CM

## 2012-01-07 MED ORDER — MEDROXYPROGESTERONE ACETATE 150 MG/ML IM SUSP
150.0000 mg | Freq: Once | INTRAMUSCULAR | Status: AC
  Administered 2012-01-07: 150 mg via INTRAMUSCULAR

## 2012-01-07 NOTE — Progress Notes (Signed)
Patient presented to the office today for sonohysterogram and endometrial biopsy as a result of dysfunction uterine bleeding. See previous note dated 12/26/2011. Patient with ultrasound in January this year demonstrated she had one fibroid measured 6.8 x 5.8 cm and several other was a were 2 cm in size or less. Patient had received Depo-Provera injection in November of 2012 in February of 2013. She had done well with a Depo-Provera but the recently had continuous bleeding and she was placed on Megace 40 mg twice a day for 10 days and asked to return to the office for followup.  Ultrasound: Uterus measured 10.9 x 8.0 x 5.4 with endometrial stripe of 6. 6.8 mm. Intramural fibroids were noted measuring 21 x 22 mm, and 12 x 15 mm respectively. She had a right subserosal myoma measuring 7.7 x 6.4 x 6.9 cm slightly increased in size in January this year. Right ovary was normal with microcalcification left ovary was normal cul-de-sac was negative sonohysterogram with no intracavitary defect.  Procedure note: The cervix was cleansed with Betadine solution. The single-tooth tenaculum was placed on the anterior cervical lip. The uterus sounded to 7-1/2 cm. A Pipelle was introduced sterilely into the intrauterine cavity and an endometrial biopsy was obtained and the tissue submitted for histological evaluation. Her Pap smear earlier this year was normal.  Assessment/plan: Patient on Depo-Provera had episode dysfunction uterine bleeding and since she is 46 years of age we decided to proceed with an endometrial biopsy along with a sonohysterogram today. Pathology report pending at time of this dictation. Patient with leiomyomatous uteri. On sonohysterogram the fibroid did not appear to be encroaching into the uterine cavity. Patient would like to wait before intervening surgically and continue to receive the Depo-Provera 150 mg IM every 3 months and followup with ultrasound once a year. She is due for a third  Depo-Provera injection today which will be administered. If she has any further problems she'll return back to the office for reassessment. All the above was explained to the patient Spanish and we'll follow accordingly.

## 2012-01-12 ENCOUNTER — Other Ambulatory Visit: Payer: Self-pay | Admitting: Internal Medicine

## 2012-01-17 ENCOUNTER — Ambulatory Visit (INDEPENDENT_AMBULATORY_CARE_PROVIDER_SITE_OTHER): Payer: BC Managed Care – PPO | Admitting: Family Medicine

## 2012-01-17 VITALS — BP 100/65 | HR 90 | Temp 98.3°F | Resp 16 | Ht 61.0 in | Wt 137.0 lb

## 2012-01-17 DIAGNOSIS — R110 Nausea: Secondary | ICD-10-CM

## 2012-01-17 DIAGNOSIS — R109 Unspecified abdominal pain: Secondary | ICD-10-CM

## 2012-01-17 DIAGNOSIS — K120 Recurrent oral aphthae: Secondary | ICD-10-CM

## 2012-01-17 LAB — POCT CBC
Granulocyte percent: 85.6 %G — AB (ref 37–80)
MCH, POC: 30 pg (ref 27–31.2)
MID (cbc): 1 — AB (ref 0–0.9)
MPV: 8.2 fL (ref 0–99.8)
POC Granulocyte: 13.9 — AB (ref 2–6.9)
POC MID %: 6.4 %M (ref 0–12)
Platelet Count, POC: 235 10*3/uL (ref 142–424)
RBC: 4.44 M/uL (ref 4.04–5.48)

## 2012-01-17 LAB — POCT URINE PREGNANCY: Preg Test, Ur: NEGATIVE

## 2012-01-17 LAB — POCT URINALYSIS DIPSTICK
Bilirubin, UA: NEGATIVE
Glucose, UA: NEGATIVE
Spec Grav, UA: 1.015
Urobilinogen, UA: 0.2

## 2012-01-17 LAB — POCT UA - MICROSCOPIC ONLY: Casts, Ur, LPF, POC: NEGATIVE

## 2012-01-17 LAB — BASIC METABOLIC PANEL
CO2: 20 mEq/L (ref 19–32)
Chloride: 105 mEq/L (ref 96–112)
Potassium: 4.4 mEq/L (ref 3.5–5.3)
Sodium: 136 mEq/L (ref 135–145)

## 2012-01-17 MED ORDER — ONDANSETRON 4 MG PO TBDP
8.0000 mg | ORAL_TABLET | Freq: Once | ORAL | Status: AC
  Administered 2012-01-17: 8 mg via ORAL

## 2012-01-17 MED ORDER — KETOROLAC TROMETHAMINE 60 MG/2ML IM SOLN
60.0000 mg | Freq: Once | INTRAMUSCULAR | Status: AC
  Administered 2012-01-17: 60 mg via INTRAMUSCULAR

## 2012-01-17 MED ORDER — CEFTRIAXONE SODIUM 1 G IJ SOLR
1.0000 g | Freq: Once | INTRAMUSCULAR | Status: AC
  Administered 2012-01-17: 1 g via INTRAMUSCULAR

## 2012-01-17 MED ORDER — TRIAMCINOLONE ACETONIDE 0.025 % EX OINT: TOPICAL_OINTMENT | Freq: Two times a day (BID) | CUTANEOUS | Status: DC

## 2012-01-17 MED ORDER — LEVOFLOXACIN 500 MG PO TABS: 500.0000 mg | ORAL_TABLET | Freq: Every day | ORAL | Status: AC

## 2012-01-17 MED ORDER — ONDANSETRON 8 MG PO TBDP: 8.0000 mg | ORAL_TABLET | Freq: Three times a day (TID) | ORAL | Status: AC | PRN

## 2012-01-17 MED ORDER — MELOXICAM 7.5 MG PO TABS: ORAL_TABLET | ORAL | Status: DC

## 2012-01-17 NOTE — Progress Notes (Signed)
Subjective:    Patient ID: Autumn Haley, female    DOB: 1965-12-01, 46 y.o.   MRN: [id number]  HPI  1) Patient presents with 2 day history (R) sided flank pain  Tactile fever, chills and nausea.  No urinary symptoms No history of nephrolithiasis  2) Recurrent apthous ulcer  Extensive work up for secondary causes in the past was unrevealing  Requests refill of kenaog cream in an oral base      Review of Systems     Objective:   Physical Exam  Constitutional: She appears well-developed.  HENT:  Mouth/Throat: Oropharynx is clear and moist.  Neck: Neck supple.  Cardiovascular: Normal rate, regular rhythm and normal heart sounds.   Pulmonary/Chest: Effort normal and breath sounds normal.  Abdominal:       (R) CVA tenderness  Neurological: She is alert.  Skin: Skin is warm.      Results for orders placed in visit on 01/17/12  POCT CBC      Component Value Range   WBC 16.2 (*) 4.6 - 10.2 (K/uL)   Lymph, poc 1.3  0.6 - 3.4    POC LYMPH PERCENT 8.0 (*) 10 - 50 (%L)   MID (cbc) 1.0 (*) 0 - 0.9    POC MID % 6.4  0 - 12 (%M)   POC Granulocyte 13.9 (*) 2 - 6.9    Granulocyte percent 85.6 (*) 37 - 80 (%G)   RBC 4.44  4.04 - 5.48 (M/uL)   Hemoglobin 13.3  12.2 - 16.2 (g/dL)   HCT, POC 40.9  81.1 - 47.9 (%)   MCV 89.3  80 - 97 (fL)   MCH, POC 30.0  27 - 31.2 (pg)   MCHC 33.6  31.8 - 35.4 (g/dL)   RDW, POC 91.4     Platelet Count, POC 235  142 - 424 (K/uL)   MPV 8.2  0 - 99.8 (fL)  POCT UA - MICROSCOPIC ONLY      Component Value Range   WBC, Ur, HPF, POC tntc     RBC, urine, microscopic 10-20     Bacteria, U Microscopic 3+     Mucus, UA neg     Epithelial cells, urine per micros 4-5     Crystals, Ur, HPF, POC neg     Casts, Ur, LPF, POC neg     Yeast, UA neg    POCT URINALYSIS DIPSTICK      Component Value Range   Color, UA yellow     Clarity, UA cloudy     Glucose, UA neg     Bilirubin, UA neg     Ketones, UA 40 mg/dl     Spec Grav, UA 7.829     Blood, UA moderate     pH, UA 6.0     Protein, UA 100 mg/dl     Urobilinogen, UA 0.2     Nitrite, UA pos     Leukocytes, UA large (3+)    POCT URINE PREGNANCY      Component Value Range   Preg Test, Ur Negative          Assessment & Plan:   1. pyelonephritis  POCT CBC, POCT UA - Microscopic Only, POCT urinalysis dipstick, ondansetron (ZOFRAN-ODT) disintegrating tablet 8 mg, ketorolac (TORADOL) injection 60 mg, POCT urine pregnancy, cefTRIAXone (ROCEPHIN) injection 1 g, levofloxacin (LEVAQUIN) 500 MG tablet, Basic metabolic panel, meloxicam (MOBIC) 7.5 MG tablet  2. Nausea  POCT urine pregnancy, ondansetron (ZOFRAN ODT) 8 MG disintegrating tablet  3. Aphthous ulcer  triamcinolone (KENALOG) 0.025 % ointment    Anticipatory guidance 24 hour follow up

## 2012-01-18 ENCOUNTER — Ambulatory Visit (INDEPENDENT_AMBULATORY_CARE_PROVIDER_SITE_OTHER): Payer: BC Managed Care – PPO | Admitting: Family Medicine

## 2012-01-18 VITALS — BP 116/76 | HR 86 | Temp 98.5°F | Resp 16

## 2012-01-18 DIAGNOSIS — N12 Tubulo-interstitial nephritis, not specified as acute or chronic: Secondary | ICD-10-CM

## 2012-01-18 LAB — POCT CBC
Granulocyte percent: 78.7 %G (ref 37–80)
MCHC: 33.2 g/dL (ref 31.8–35.4)
MID (cbc): 1.2 — AB (ref 0–0.9)
POC Granulocyte: 11.6 — AB (ref 2–6.9)
POC LYMPH PERCENT: 13.4 %L (ref 10–50)
Platelet Count, POC: 240 10*3/uL (ref 142–424)
RDW, POC: 14.3 %

## 2012-01-18 MED ORDER — NITROFURANTOIN MONOHYD MACRO 100 MG PO CAPS: 100.0000 mg | ORAL_CAPSULE | Freq: Two times a day (BID) | ORAL | Status: AC

## 2012-01-18 NOTE — Progress Notes (Signed)
  Subjective:    Patient ID: Autumn Haley, female    DOB: 15-Nov-1965, 46 y.o.   MRN: [id number]  HPI  Patient presents in follow up of pyelonephritis Day # 2 levoquin  Occasional chills Minimal (R) CVA tenderness  Rash abdomen; not pruritic  Review of Systems     Objective:   Physical Exam  Constitutional: She appears well-developed.  Neck: Neck supple.  Cardiovascular: Normal rate, regular rhythm and normal heart sounds.   Pulmonary/Chest: Effort normal and breath sounds normal.  Abdominal: Soft.       No (R) CVA  Neurological: She is alert.  Skin: Skin is warm.       Small follicular papules lower abdomen      Results for orders placed in visit on 01/18/12  POCT CBC      Component Value Range   WBC 14.8 (*) 4.6 - 10.2 (K/uL)   Lymph, poc 2.0  0.6 - 3.4    POC LYMPH PERCENT 13.4  10 - 50 (%L)   MID (cbc) 1.2 (*) 0 - 0.9    POC MID % 7.9  0 - 12 (%M)   POC Granulocyte 11.6 (*) 2 - 6.9    Granulocyte percent 78.7  37 - 80 (%G)   RBC 4.47  4.04 - 5.48 (M/uL)   Hemoglobin 13.3  12.2 - 16.2 (g/dL)   HCT, POC 40.9  81.1 - 47.9 (%)   MCV 89.7  80 - 97 (fL)   MCH, POC 29.8  27 - 31.2 (pg)   MCHC 33.2  31.8 - 35.4 (g/dL)   RDW, POC 91.4     Platelet Count, POC 240  142 - 424 (K/uL)   MPV 8.2  0 - 99.8 (fL)       Assessment & Plan:  Pyelonephritis Papular rash; likely allergic reaction to Levoquin as has been on PCN in the past without side effects  Hold Levoquin Macrobid BID X 10 days; will monitor closely as technically not drug of choice for pyelonephritis (has been on Macrobid in the past) Patient to call with follow up in 24 hours. Await culture results

## 2012-01-20 ENCOUNTER — Ambulatory Visit (INDEPENDENT_AMBULATORY_CARE_PROVIDER_SITE_OTHER): Payer: BC Managed Care – PPO | Admitting: Family Medicine

## 2012-01-20 VITALS — BP 132/87 | HR 66 | Temp 98.3°F | Resp 16 | Ht 61.0 in | Wt 135.2 lb

## 2012-01-20 DIAGNOSIS — N12 Tubulo-interstitial nephritis, not specified as acute or chronic: Secondary | ICD-10-CM

## 2012-01-20 LAB — POCT UA - MICROSCOPIC ONLY
Mucus, UA: NEGATIVE
Yeast, UA: NEGATIVE

## 2012-01-20 LAB — POCT URINALYSIS DIPSTICK
Bilirubin, UA: NEGATIVE
Leukocytes, UA: NEGATIVE

## 2012-01-22 LAB — URINE CULTURE: Organism ID, Bacteria: NO GROWTH

## 2012-01-22 NOTE — Progress Notes (Signed)
  Subjective:    Patient ID: Autumn Haley, female    DOB: [birth year], 46 y.o.   MRN: [id number]  HPI Patients in follow up of pyelonephritis. Son is present as our interpreter.  Patient states chills and back pain have resolve and energy returning.  Interesting, patient never filled Macrobid and has been without antibiotics for 1 1/2 days.  Rash on abdomen without chang and son gives history that patient has had rashes in the past consistent with folliculitis.   Review of Systems     Objective:   Physical Exam  Constitutional: She appears well-developed.  HENT:  Mouth/Throat: Oropharynx is clear and moist.  Neck: Neck supple.  Cardiovascular: Normal rate, regular rhythm and normal heart sounds.   Pulmonary/Chest: Effort normal and breath sounds normal.  Abdominal: Soft. There is no tenderness. There is no guarding.       No CVA tenderness  Neurological: She is alert.  Skin: Skin is warm.    Results for orders placed in visit on 01/20/12  POCT URINALYSIS DIPSTICK      Component Value Range   Color, UA yellow     Clarity, UA hazy     Glucose, UA neg     Bilirubin, UA neg     Ketones, UA meg     Spec Grav, UA 1.015     Blood, UA moderate     pH, UA 6.0     Protein, UA 30 mg/dl     Urobilinogen, UA 1.0     Nitrite, UA neg     Leukocytes, UA Negative    POCT UA - MICROSCOPIC ONLY      Component Value Range   WBC, Ur, HPF, POC 7-8     RBC, urine, microscopic tntc     Bacteria, U Microscopic trace     Mucus, UA neg     Epithelial cells, urine per micros 1-3     Crystals, Ur, HPF, POC neg     Casts, Ur, LPF, POC 0-1     Yeast, UA neg    URINE CULTURE      Component Value Range   Colony Count NO GROWTH     Organism ID, Bacteria NO GROWTH         Assessment & Plan:   1. Pyelonephritis, unspecified  POCT urinalysis dipstick, POCT UA - Microscopic Only, Urine culture    I am still leary to resume Levoquin; though rash more consistent with patient's  recurrent folliculitis and not drug allergy. Patient encouraged to fill Macrobid and call me with follow up in 24 hours.

## 2012-02-12 ENCOUNTER — Ambulatory Visit (INDEPENDENT_AMBULATORY_CARE_PROVIDER_SITE_OTHER): Payer: BC Managed Care – PPO | Admitting: Physician Assistant

## 2012-02-12 VITALS — BP 123/84 | HR 69 | Temp 98.4°F | Resp 16 | Ht 61.0 in | Wt 137.0 lb

## 2012-02-12 DIAGNOSIS — Z87440 Personal history of urinary (tract) infections: Secondary | ICD-10-CM

## 2012-02-12 DIAGNOSIS — H8110 Benign paroxysmal vertigo, unspecified ear: Secondary | ICD-10-CM

## 2012-02-12 DIAGNOSIS — Z87448 Personal history of other diseases of urinary system: Secondary | ICD-10-CM

## 2012-02-12 DIAGNOSIS — R109 Unspecified abdominal pain: Secondary | ICD-10-CM

## 2012-02-12 LAB — POCT CBC
Granulocyte percent: 51.4 %G (ref 37–80)
HCT, POC: 42 % (ref 37.7–47.9)
Lymph, poc: 3.6 — AB (ref 0.6–3.4)
MCH, POC: 28.7 pg (ref 27–31.2)
MCHC: 32.1 g/dL (ref 31.8–35.4)
MCV: 89.3 fL (ref 80–97)
MID (cbc): 0.6 (ref 0–0.9)
POC LYMPH PERCENT: 41.3 %L (ref 10–50)
Platelet Count, POC: 315 10*3/uL (ref 142–424)
RDW, POC: 14.5 %

## 2012-02-12 LAB — POCT UA - MICROSCOPIC ONLY
Mucus, UA: NEGATIVE
WBC, Ur, HPF, POC: NEGATIVE

## 2012-02-12 LAB — POCT URINALYSIS DIPSTICK
Bilirubin, UA: NEGATIVE
Glucose, UA: NEGATIVE
Ketones, UA: NEGATIVE
Leukocytes, UA: NEGATIVE
pH, UA: 7

## 2012-02-12 NOTE — Progress Notes (Signed)
Subjective:    Patient ID: Autumn Haley, female    DOB: [birth year], 46 y.o.   MRN: [id number]  HPI Patient presents for re-evaluation of pyelonephritis.  She was last seen for this 5/28 byt Dr. Hal Hope (she was advised to re-evaluate the following week, as she was on an atypical drug for treatment).  She was initially placed on Levaquin, but do to the development of skin lesions on the abdomen (thought likely folliculitis, but allergic reaction not excluded), was switched to Macrobid.  There was some initial non-compliance, but eventually, patient completed 10 days of Macrobid.  Today she reports mild right sided flank pain, noting that it is [facility] better than at her last visit.  She has occasional, mild low pelvic discomfort with urination.  No hematuria.  No vaginal D/C.  LMP was 02/02/2012.  In May, her period lasted "a whole month." No fever, chills.  She does report feeling dizzy when she lies down, and with rapid position change of her head.  Her daughter is presents and helps with translation, but the patient speaks [place].  Review of Systems As above.  No CP, SOB, HA, Vision change.    Objective:   Physical Exam  Vital signs noted. Well-developed, well nourished hispanic female who is awake, alert and oriented, in NAD. HEENT: [place]/AT, PERRL, EOMI.  Sclera and conjunctiva are clear.  Funduscopic exam is normal bilaterally.  No nystagmus. EAC are patent, TMs are normal in appearance. Nasal mucosa is pink and moist. OP is clear. Neck: supple, non-tender, no lymphadenopathy, thyromegaly. Heart: RRR, no murmur Lungs: CTA Abdomen: normo-active bowel sounds, supple, non-tender, no mass or organomegaly. Back:  Non-tender on palpation of the vertebrae and spinous muscles.  Mild right CVA tenderness. FROM.  Extremities: no cyanosis, clubbing or edema. Skin: warm and dry without rash. Neurologic: CN II-XII normal.  No nystagmus.  Good strength.  Normal sensation.  Results for orders  placed in visit on 02/12/12  POCT UA - MICROSCOPIC ONLY      Component Value Range   WBC, Ur, HPF, POC negative     RBC, urine, microscopic 0-1     Bacteria, U Microscopic negative     Mucus, UA negative     Epithelial cells, urine per micros 0-3     Crystals, Ur, HPF, POC negative     Casts, Ur, LPF, POC negative     Yeast, UA negative    POCT URINALYSIS DIPSTICK      Component Value Range   Color, UA yellow     Clarity, UA clear     Glucose, UA negative     Bilirubin, UA negative     Ketones, UA negative     Spec Grav, UA 1.015     Blood, UA negative     pH, UA 7.0     Protein, UA negative     Urobilinogen, UA 0.2     Nitrite, UA negative     Leukocytes, UA Negative    POCT CBC      Component Value Range   WBC 8.8  4.6 - 10.2 K/uL   Lymph, poc 3.6 (*) 0.6 - 3.4   POC LYMPH PERCENT 41.3  10 - 50 %L   MID (cbc) 0.6  0 - 0.9   POC MID % 7.3  0 - 12 %M   POC Granulocyte 4.5  2 - 6.9   Granulocyte percent 51.4  37 - 80 %G   RBC 4.70  4.04 - 5.48 M/uL  Hemoglobin 13.5  12.2 - 16.2 g/dL   HCT, POC 54.0  98.1 - 47.9 %   MCV 89.3  80 - 97 fL   MCH, POC 28.7  27 - 31.2 pg   MCHC 32.1  31.8 - 35.4 g/dL   RDW, POC 19.1     Platelet Count, POC 315  142 - 424 K/uL   MPV 8.3  0 - 99.8 fL        Assessment & Plan:   1. Flank pain  POCT UA - Microscopic Only, POCT urinalysis dipstick, POCT CBC  2. Vertigo, benign positional    3. H/O pyelonephritis     Patient Instructions  Get lots of rest and drink at least 64 ounces of water each day.  Use caution with making position changes.  If your dizziness persists with these efforts, return for re-evaluation.

## 2012-02-12 NOTE — Patient Instructions (Signed)
Get lots of rest and drink at least 64 ounces of water each day.  Use caution with making position changes.  If your dizziness persists with these efforts, return for re-evaluation.

## 2012-03-26 ENCOUNTER — Ambulatory Visit (INDEPENDENT_AMBULATORY_CARE_PROVIDER_SITE_OTHER): Payer: BC Managed Care – PPO | Admitting: Anesthesiology

## 2012-03-26 DIAGNOSIS — N949 Unspecified condition associated with female genital organs and menstrual cycle: Secondary | ICD-10-CM

## 2012-03-26 DIAGNOSIS — N938 Other specified abnormal uterine and vaginal bleeding: Secondary | ICD-10-CM

## 2012-03-26 MED ORDER — MEDROXYPROGESTERONE ACETATE 150 MG/ML IM SUSP
150.0000 mg | Freq: Once | INTRAMUSCULAR | Status: AC
  Administered 2012-03-26: 150 mg via INTRAMUSCULAR

## 2012-06-07 ENCOUNTER — Other Ambulatory Visit: Payer: Self-pay | Admitting: Gynecology

## 2012-06-07 DIAGNOSIS — Z3042 Encounter for surveillance of injectable contraceptive: Secondary | ICD-10-CM

## 2012-06-07 MED ORDER — MEDROXYPROGESTERONE ACETATE 150 MG/ML IM SUSP: 150.0000 mg | INTRAMUSCULAR | Status: DC

## 2012-06-09 ENCOUNTER — Other Ambulatory Visit: Payer: Self-pay | Admitting: Gynecology

## 2012-06-09 DIAGNOSIS — Z1231 Encounter for screening mammogram for malignant neoplasm of breast: Secondary | ICD-10-CM

## 2012-06-10 ENCOUNTER — Ambulatory Visit
Admission: RE | Admit: 2012-06-10 | Discharge: 2012-06-10 | Disposition: A | Discharge status: Home / Self Care | Payer: BC Managed Care – PPO | Source: Ambulatory Visit | Attending: Gynecology | Admitting: Gynecology

## 2012-06-10 DIAGNOSIS — Z1231 Encounter for screening mammogram for malignant neoplasm of breast: Secondary | ICD-10-CM

## 2012-06-11 ENCOUNTER — Ambulatory Visit (INDEPENDENT_AMBULATORY_CARE_PROVIDER_SITE_OTHER): Payer: BC Managed Care – PPO | Admitting: Anesthesiology

## 2012-06-11 DIAGNOSIS — N938 Other specified abnormal uterine and vaginal bleeding: Secondary | ICD-10-CM

## 2012-06-11 DIAGNOSIS — N949 Unspecified condition associated with female genital organs and menstrual cycle: Secondary | ICD-10-CM

## 2012-06-11 MED ORDER — MEDROXYPROGESTERONE ACETATE 150 MG/ML IM SUSP
150.0000 mg | Freq: Once | INTRAMUSCULAR | Status: AC
  Administered 2012-06-11: 150 mg via INTRAMUSCULAR

## 2012-07-26 ENCOUNTER — Ambulatory Visit (INDEPENDENT_AMBULATORY_CARE_PROVIDER_SITE_OTHER): Payer: BC Managed Care – PPO | Admitting: Physician Assistant

## 2012-07-26 VITALS — BP 124/82 | HR 74 | Temp 98.1°F | Resp 16

## 2012-07-26 DIAGNOSIS — B9689 Other specified bacterial agents as the cause of diseases classified elsewhere: Secondary | ICD-10-CM

## 2012-07-26 DIAGNOSIS — N760 Acute vaginitis: Secondary | ICD-10-CM

## 2012-07-26 DIAGNOSIS — R300 Dysuria: Secondary | ICD-10-CM

## 2012-07-26 DIAGNOSIS — N289 Disorder of kidney and ureter, unspecified: Secondary | ICD-10-CM | POA: Insufficient documentation

## 2012-07-26 DIAGNOSIS — R8290 Unspecified abnormal findings in urine: Secondary | ICD-10-CM

## 2012-07-26 DIAGNOSIS — R82998 Other abnormal findings in urine: Secondary | ICD-10-CM

## 2012-07-26 LAB — POCT WET PREP WITH KOH
Clue Cells Wet Prep HPF POC: 50
Trichomonas, UA: NEGATIVE

## 2012-07-26 LAB — POCT UA - MICROSCOPIC ONLY
Casts, Ur, LPF, POC: NEGATIVE
Crystals, Ur, HPF, POC: NEGATIVE
Yeast, UA: NEGATIVE

## 2012-07-26 LAB — POCT URINALYSIS DIPSTICK
Blood, UA: NEGATIVE
Leukocytes, UA: NEGATIVE
Nitrite, UA: NEGATIVE
Urobilinogen, UA: 0.2
pH, UA: 5.5

## 2012-07-26 LAB — GLUCOSE, POCT (MANUAL RESULT ENTRY): POC Glucose: 99 mg/dl (ref 70–99)

## 2012-07-26 MED ORDER — METRONIDAZOLE 500 MG PO TABS: 500.0000 mg | ORAL_TABLET | Freq: Two times a day (BID) | ORAL | Status: DC

## 2012-07-26 NOTE — Patient Instructions (Addendum)
Vaginosis bacteriana (Bacterial Vaginosis) La vaginosis bacteriana es una infeccin vaginal en la que el equilibrio normal de las bacterias de la vagina se modifica. Este equilibrio normal se ve afectado por un desarrollo excesivo de ciertas bacterias. Hay diferentes tipos de bacteria que causan la vaginosis bacteriana. Es el problema vaginal ms comn en las mujeres de edad frtil. CAUSAS  La causa de este trastorno no se conoce bien. Se produce como consecuencia de un aumento o desequilibrio de las bacterias nocivas.  Algunas actividades o conductas pueden poner en peligro el equilibrio normal de las bacterias en la vagina, y aumentar el riesgo. Entre ellas:  Tener un compaero sexual o mltiples compaeros sexuales.  Las duchas vaginales  Usar un dispositivo intrauterino (DIU) como mtodo anticonceptivo.  No se conoce el papel que juega la actividad sexual en el desarrollo de una VB. Sin embargo, las mujeres que nunca tuvieron relaciones sexuales raramente se infectan. El contagio no se produce en asientos de baos, camas, piscinas o por tocar objetos.  SNTOMAS  Flujo vaginal grisceo.  Olor parecido al pescado con la secrecin, en especial despus de tener relaciones sexuales.  Picazn o irritacin de la vagina y la vulva.  Ardor o dolor al orinar.  Algunas mujeres no presentan ningn sntoma. DIAGNSTICO El mdico realizar un examen vaginal para diagnosticar una vaginosis bacteriana. El mdico le indicar anlisis de laboratorio y observar las muestras del lquido vaginal en el microscopio. Buscar bacterias y clulas anormales (clulas clave), pH mayor a 4.5 y una prueba de aminas positivo, todos ellos asociados al BV.  RIESGOS Y COMPLICACIONES  Enfermedad plvica inflamatoria (EPI).  Infecciones luego de una ciruga ginecolgica.  VIH.  Virus del Herpes TRATAMIENTO En algunos casos, la infeccin desaparece sin tratamiento. Sin embargo, todas las mujeres con sntomas  de VB deben tratarse para evitar complicaciones, especialmente si se ha planificado una ciruga ginecolgica. Los compaeros varones generalmente no necesitan tratamiento. Sin embargo, puede contagiarse entre parejas femeninas, de modo que el tratamiento se realiza para evitar recurrencias.   La VB puede tratarse con medicamentos que destruyen grmenes (antibiticos). Estos se presentan en pldoras o en cremas vaginales. Tanto mujeres embarazadas como no embarazadas pueden usar ambos, pero se indican en dosis diferentes. Estos antibiticos no daan al beb.  La VB puede recurrir luego del tratamiento. Si esto ocurre, se prescribir un segundo tratamiento con antibiticos.  El tratamiento es importante en el caso de las mujeres embarazadas. Si no se trata, la VB puede causar parto prematuro, especialmente en una mujer que ha tenido un parto prematuro en el pasado. Todas las mujeres embarazadas que tienen sntomas de VB deben ser controladas y tratadas.  En los casos de recurrencia crnica, se prescribe un tratamiento con un gel vaginal dos veces por semana INSTRUCCIONES PARA EL CUIDADO DOMICILIARIO  Tome los medicamentos que le indic el mdico.  No mantenga relaciones sexuales hasta completar el tratamiento.  Comunique a sus compaeros sexuales que sufre una infeccin vaginal. Ellos deben concurrir para un control mdico si tienen problemas como una urticaria leve o picazn.  Practique el sexo seguro. Use preservativos. Tenga un solo compaero sexual. PREVENCIN Algunos pasos bsicos de prevencin pueden ayudar a reducir el riesgo de desequilibrio de las bacterias vaginales y de sufrir VB.  No mantener relaciones sexuales (abstinencia)  No utilice duchas vaginales.  Utilice todos los medicamentos que le han prescripto para el tratamiento, aunque los sntomas hayan desaparecido.  Comunique a su compaero sexual que sufre una VB. De ese modo   podr tratase, si es necesario, y podr evitar una  recurrencia. SOLICITE ATENCIN MDICA SI:  Los sntomas no mejoran luego de 3 das de tratamiento.  Aumentan la secrecin, el dolor o la fiebre. ASEGRESE QUE:   Comprende estas instrucciones.  Controlar su enfermedad.  Solicitar ayuda de inmediato si no mejora o empeora. PARA MS INFORMACIN: Division de STD Prevention (DSTDP), Centers for Disease Control and Prevention (Centros para el control y la prevencin de enfermedades, CDC): www.cdc.gov/std American Social Health Association (ASHA): www.ashastd.org  Document Released: 11/18/2007 Document Revised: 11/03/2011 Exit[employer] Patient Information 2013 [employer].  

## 2012-07-26 NOTE — Progress Notes (Signed)
Subjective:    Patient ID: Autumn Haley, female    DOB: 05/18/1966, 46 y.o.   MRN: [id number]  HPI This 46 y.o. female presents for evaluation of burning with urination for about 4 days.  No urinary urgency or frequency.  No hematuria.  No back or belly pain.  No nausea, vomiting or diarrhea.  Not currently sexually active, as her husband has recently had a toe amputation due to complications of diabetes.  No vaginal discharge that is abnormal for her.   Past Medical History  Diagnosis Date  . NSVD (normal spontaneous vaginal delivery)     X 2  . SAB (spontaneous abortion)   . Fibroid     FIBROID UTERUS  . Chronic kidney disease     right kidney    History reviewed. No pertinent past surgical history.  Prior to Admission medications   Medication Sig Start Date End Date Taking? Authorizing Provider  Calcium Carbonate-Vit D-Min (CALTRATE PLUS PO) Take by mouth.   Yes Historical Provider, MD  medroxyPROGESTERone (DEPO-PROVERA) 150 MG/ML injection Inject 1 mL (150 mg total) into the muscle every 3 (three) months. 06/07/12  Yes Ok Edwards, MD    Allergies  Allergen Reactions  . Levaquin (Levofloxacin) Rash    History   Social History  . Marital Status: Married    Spouse Name: Emeline Gins    Number of Children: 2  . Years of Education: 12   Occupational History  . ASSISTANT SUPERVISOR     Housekeeping   Social History Main Topics  . Smoking status: Never Smoker   . Smokeless tobacco: Never Used  . Alcohol Use: Yes     Comment: OCCASIONALLY  . Drug Use: No  . Sexually Active: Yes -- Female partner(s)    Birth Control/ Protection: Rhythm   Other Topics Concern  . Not on file   Social History Narrative   From [place], [place].  Lives here with her husband and 2 children.    History reviewed. No pertinent family history.  Review of Systems As above.    Objective:   Physical Exam Blood pressure 124/82, pulse 74, temperature 98.1 F (36.7 C), temperature  source Oral, resp. rate 16, last menstrual period 07/05/2012, SpO2 100.00%. There is no height or weight on file to calculate BMI. Well-developed, well nourished Hispanic female who is awake, alert and oriented, in NAD. HEENT: [place]/AT, PERRL, EOMI.  Sclera and conjunctiva are clear.  EAC are patent, TMs are normal in appearance. Nasal mucosa is pink and moist. OP is clear. Neck: supple, non-tender, no lymphadenopathy, thyromegaly. Heart: RRR, no murmur Lungs: normal effort, CTA Abdomen: normo-active bowel sounds, supple, non-tender, no mass or organomegaly. Extremities: no cyanosis, clubbing or edema. Skin: warm and dry without rash. Psychologic: good mood and appropriate affect, normal speech and behavior.   Results for orders placed in visit on 07/26/12  POCT URINALYSIS DIPSTICK      Component Value Range   Color, UA dark yellow     Clarity, UA clear     Glucose, UA neg     Bilirubin, UA neg     Ketones, UA 15     Spec Grav, UA >=1.030     Blood, UA neg     pH, UA 5.5     Protein, UA trace     Urobilinogen, UA 0.2     Nitrite, UA neg     Leukocytes, UA Negative    POCT UA - MICROSCOPIC ONLY  Component Value Range   WBC, Ur, HPF, POC 1-5     RBC, urine, microscopic 0-1     Bacteria, U Microscopic trace     Mucus, UA moderate     Epithelial cells, urine per micros 0-1     Crystals, Ur, HPF, POC neg     Casts, Ur, LPF, POC neg     Yeast, UA neg    POCT WET PREP WITH KOH      Component Value Range   Trichomonas, UA Negative     Clue Cells Wet Prep HPF POC 50%     Epithelial Wet Prep HPF POC 10-20     Yeast Wet Prep HPF POC neg     Bacteria Wet Prep HPF POC 4+     RBC Wet Prep HPF POC 0-2     WBC Wet Prep HPF POC 1-5     KOH Prep POC Negative    GLUCOSE, POCT (MANUAL RESULT ENTRY)      Component Value Range   POC Glucose 99  70 - 99 mg/dl       Assessment & Plan:   1. Dysuria  POCT urinalysis dipstick, POCT UA - Microscopic Only, POCT Wet Prep with KOH, Urine  culture, POCT glucose (manual entry)  2. Abnormal urine odor  POCT urinalysis dipstick, POCT UA - Microscopic Only  3. BV (bacterial vaginosis)  metroNIDAZOLE (FLAGYL) 500 MG tablet   Supportive care, anticipatory guidance.  RTC if symptoms worsen/persist.

## 2012-07-27 LAB — URINE CULTURE: Colony Count: 25000

## 2012-08-02 ENCOUNTER — Telehealth: Payer: Self-pay

## 2012-08-02 NOTE — Telephone Encounter (Signed)
Please call patient. The UCx revealed a small number of bacteria in the urine, but no infection. If symptoms continue, please return for re-evaluation.  We have been calling about her urine culture.

## 2012-08-02 NOTE — Telephone Encounter (Signed)
Pt has called twice today to see why we have called her today she does not  Understand why we have been trying  To reach her  Best number [phone]

## 2012-08-03 NOTE — Telephone Encounter (Signed)
Called yesterday, no answer. Left detailed message today, since she is so difficult to get in contact with. If she needs anything further, I have instructed her to call me.

## 2012-08-10 ENCOUNTER — Ambulatory Visit (INDEPENDENT_AMBULATORY_CARE_PROVIDER_SITE_OTHER): Payer: BC Managed Care – PPO | Admitting: Gynecology

## 2012-08-10 ENCOUNTER — Encounter: Payer: Self-pay | Admitting: Gynecology

## 2012-08-10 VITALS — BP 130/86

## 2012-08-10 DIAGNOSIS — Z23 Encounter for immunization: Secondary | ICD-10-CM

## 2012-08-10 DIAGNOSIS — B9689 Other specified bacterial agents as the cause of diseases classified elsewhere: Secondary | ICD-10-CM

## 2012-08-10 DIAGNOSIS — R300 Dysuria: Secondary | ICD-10-CM

## 2012-08-10 DIAGNOSIS — N760 Acute vaginitis: Secondary | ICD-10-CM

## 2012-08-10 LAB — WET PREP FOR TRICH, YEAST, CLUE
Trich, Wet Prep: NONE SEEN
Yeast Wet Prep HPF POC: NONE SEEN

## 2012-08-10 LAB — URINALYSIS W MICROSCOPIC + REFLEX CULTURE
Hgb urine dipstick: NEGATIVE
Ketones, ur: NEGATIVE mg/dL
Nitrite: NEGATIVE
Protein, ur: NEGATIVE mg/dL
pH: 5.5 (ref 5.0–8.0)

## 2012-08-10 MED ORDER — METRONIDAZOLE 0.75 % VA GEL: 1.0000 | Freq: Two times a day (BID) | VAGINAL | Status: DC

## 2012-08-10 NOTE — Patient Instructions (Signed)
Vaginosis bacteriana (Bacterial Vaginosis) La vaginosis bacteriana es una infeccin vaginal en la que el equilibrio normal de las bacterias de la vagina se modifica. Este equilibrio normal se ve afectado por un desarrollo excesivo de ciertas bacterias. Hay diferentes tipos de bacteria que causan la vaginosis bacteriana. Es el problema vaginal ms comn en las mujeres de edad frtil. CAUSAS  La causa de este trastorno no se conoce bien. Se produce como consecuencia de un aumento o desequilibrio de las bacterias nocivas.  Algunas actividades o conductas pueden poner en peligro el equilibrio normal de las bacterias en la vagina, y Astronomer. Entre ellas:  Tener un compaero sexual o mltiples compaeros sexuales.  Las duchas vaginales  Usar un dispositivo intrauterino (DIU) como mtodo anticonceptivo.  No se conoce el papel que juega la actividad sexual en el desarrollo de [place] VB. Sin embargo, las mujeres que nunca tuvieron relaciones sexuales raramente se infectan. El contagio no se produce en asientos de baos, camas, piscinas o por tocar objetos.  SNTOMAS  Flujo vaginal grisceo.  Olor parecido al pescado con la secrecin, en especial despus de Management consultant.  Picazn o irritacin de la vagina y la vulva.  Ardor o dolor al [employer].  Algunas mujeres no presentan ningn sntoma. DIAGNSTICO El mdico realizar un examen vaginal para diagnosticar una vaginosis bacteriana. El mdico le indicar anlisis de laboratorio y observar las muestras del lquido vaginal en el microscopio. Buscar bacterias y clulas anormales (clulas clave), pH mayor a 4.5 y [place] prueba de aminas positivo, todos ellos asociados al BV.  RIESGOS Y COMPLICACIONES  Enfermedad plvica inflamatoria (EPI).  Infecciones luego de una ciruga ginecolgica.  VIH.  Virus del Herpes TRATAMIENTO En algunos casos, la infeccin desaparece sin tratamiento. Sin embargo, todas las mujeres con sntomas  de VB deben tratarse para evitar complicaciones, especialmente si se ha planificado una ciruga ginecolgica. Los compaeros varones generalmente no necesitan tratamiento. Sin embargo, puede contagiarse entre parejas femeninas, de modo que el tratamiento se realiza para Dietitian.   La VB puede tratarse con medicamentos que destruyen grmenes (antibiticos). Estos se presentan en pldoras o en cremas vaginales. Tanto mujeres embarazadas como no embarazadas pueden usar ambos, pero se indican en dosis diferentes. Estos antibiticos no daan al beb.  La VB puede recurrir [employer]. Si esto ocurre, se prescribir un segundo tratamiento con antibiticos.  El tratamiento es importante en el caso de las mujeres [place]. Si no se trata, la VB puede causar Coca[employer]a, especialmente en [employer] que ha tenido un parto prematuro en el pasado. Todas las mujeres embarazadas que tienen sntomas de VB deben ser controladas y tratadas.  En los casos de recurrencia crnica, se prescribe un tratamiento con un gel vaginal dos veces por semana INSTRUCCIONES PARA EL CUIDADO DOMICILIARIO  Tome los medicamentos que le indic el mdico.  No mantenga relaciones sexuales Librarian, academic.  Comunique a sus compaeros sexuales que sufre una infeccin vaginal. Ellos deben concurrir para un control mdico si tienen problemas como una urticaria leve o picazn.  Practique el sexo seguro. Use preservativos. Tenga un solo compaero sexual. PREVENCIN Algunos pasos bsicos de prevencin pueden ayudar a reducir el riesgo de desequilibrio de las bacterias vaginales y de sufrir VB.  No mantener relaciones sexuales (abstinencia)  No utilice duchas vaginales.  Utilice todos los Cardinal Health han prescripto para el [place], aunque los sntomas hayan desaparecido.  Comunique a su compaero sexual que sufre una VB. Otho Najjar modo  podr tratase, si es necesario, y podr Therapist, art. SOLICITE ATENCIN MDICA SI:  Los sntomas no mejoran luego de [address] de [place].  Aumentan la secrecin, el dolor o la fiebre. ASEGRESE QUE:   Comprende estas instrucciones.  Controlar su enfermedad.  Solicitar ayuda de inmediato si no mejora o empeora. PARA MS INFORMACIN: Division de STD Prevention (DSTDP), Centers for Disease Control and Prevention (Centros para el control y la prevencin de enfermedades, CDC): SolutionApps.co.za American Social Health Association (ASHA): www.ashastd.org  Document Released: 11/18/2007 Document Revised: 11/03/2011 [facility] Patient Information 2013 [place], [place].  Vacuna desactivada contra la influenza, Lo que usted necesita saber (Inactivated Influenza Vaccine, What You Need to Know) POR QU VACUNARSE?  La influenza (conocida como gripe o "flu") es una enfermedad contagiosa.  Es causada por el virus de la influenza, que se puede transmitir al toser, Engineering geologist o mediante las secreciones nasales.  A cualquiera le puede dar influenza, pero los ndices de infeccin son [employer]Ex nios. La Harley[employer]Davidson de las personas solo experimentan sntomas por unos pocos das e incluyen:  Teacher, English as a foreign language o escalofros.  Dolor de Advertising copywriter.  Dolores musculares.  Cansancio.  Tos.  Dolor de [place].  Nariz moquienta o congestionada. Otras enfermedades pueden DIRECTV mismos sntomas y a menudo se confunden con la influenza. Los nios pequeos, las Smith International de 65 aos de edad, las mujeres embarazadas y las personas con ciertas condiciones de salud, como enfermedades del corazn, pulmn o rin o un sistema inmunolgico debilitado, se pueden enfermar mucho ms. La influenza puede causar fiebre alta y neumona y puede empeorar condiciones de salud preexistentes. Puede causar diarrea y convulsiones en los nios. Miles de personas mueren cada ao por la influenza y muchas ms requieren hospitalizacin. Si se vacuna, puede protegerse usted  mismo y Arts administrator a otros. VACUNA DESACTIVADA CONTRA LA INFLUENZA  Hay dos tipos de vacuna contra la influenza:  La vacuna inactivada (el virus est inactivo), de la "vacuna contra la gripe" se aplica con una aguja.  La vacuna viva atenuada (debilitado), que see aplica como roco en las fosas nasales. Esta vacuna se describe en una Hoja de Informacin sobre las [place], por separado. Hay una "dosis ms alta" de vacuna desactivada disponible para personas mayores de 65 aos. Para ms informacin, consulte a su doctor.  Cada ao los cientficos tratan de que los virus de la vacuna coincidan con los que tienen ms probabilidades de causar la influenza ese ao. La vacuna contra la influenza no prevendr otras enfermedades causadas por otros virus, incluyendo los virus de influenza que no estn incluidos en la vacuna. Despus de la vacunacin, toma hasta 2 semanas para desarrollar proteccin. La proteccin dura hasta un ao. Algunas vacunas desactivadas contra la influenza contienen un conservante llamado timerosal. La vacuna libre de timerosal tambin est disponible. Consulte a su doctor para ms informacin. QUINES DEBEN RECIBIR LA VACUNA DESACTIVADA CONTRA LA INFLUENZA Y CUNDO? QUINES  Todas las Smith International de 6 meses de edad deben recibir la vacuna contra la influenza.  La vacunacin es especialmente importante para las personas con mayor riesgo de experimentar un caso grave de influenza y las que estn en contacto directo con ellas, incluyendo al personal mdico, y las personas en contacto cercano con bebs menores de 6 meses de edad. CUNDO Reciba la vacuna tan pronto como est disponible. Esto le dar la proteccin necesaria en caso de que la temporada de influenza llegue temprano. Puede vacunarse durante todo el Duke Energy  que la enfermedad siga ocurriendo en su comunidad. La influenza puede ocurrir a Customer service manager, pero la [place] de influenza ocurre desde octubre [employer] K[employer]. En las ltimas temporadas, la mayora de las infecciones han ocurrido en enero y Research scientist (physical sciences). Vacunndose Science Applications International, o an despus, ser beneficioso en casi todos los [place]. Los adultos y los nios mayores requieren una dosis de la vacuna contra la influenza cada ao. Sin embargo, algunos nios menores de 9 aos de edad [address] dosis para estar protegidos. Consulte a su doctor. Se puede dar la vacuna contra la influenza a la misma vez que otras vacunas, incluyendo la vacuna antineumoccica. ALGUNAS PERSONAS NO DEBEN RECIBIR LA VACUNA DESACTIVADA CONTRA LA INFLUENZA O DEBEN ESPERAR  Diga a su doctor si tiene cualquier alergia grave (que amenaza la vida), incluyendo alergia grave a los [place]. Una grave alergia a cualquier componente de la vacuna puede ser razn para no vacunarse. Las Therapist, art a la vacuna contra la influenza son poco comunes.  Diga a su doctor si alguna vez ha tenido una reaccin grave despus de haber recibido una dosis de la vacuna contra la influenza.  Diga a su doctor si alguna vez ha tenido el sndrome de Pension scheme manager (una enfermedad paraltica grave, tambin conocida como GBS). Su doctor le puede ayudar a decidir si es recomendable vacunarse.  Las personas moderadamente o muy enfermas por lo general deben esperar hasta recuperarse antes de vacunarse contra la influenza. Si est enfermo, hable con su doctor sobre si debe cambiar la cita para vacunarse. Las personas con una enfermedad leve por lo general se pueden vacunar. CULES SON LOS RIESGOS DE LA VACUNA DESACTIVADA CONTRA LA INFLUENZA? Los vacunas, como cualquier [place], pueden causar problemas serios, como Therapist, art graves. El riesgo de que la vacuna cause un dao serio, o la [place], es sumamente pequeo. Problemas serios de la vacuna desactivada contra la influenza ocurren muy rara vez. Los virus en la vacuna desactivada estn muertos o sea que no se puede enfermar de influenza mediante  la vacuna. Problemas leves:  Molestia, enrojecimiento o hinchazn en el lugar donde lo vacunaron.  Ronquera; dolor, enrojecimiento y [employer] ojos; tos.  Grant Ruts.  Dolores.  Dolor de [place].  Picazn.  Cansancio. Si estos problemas ocurren, en general comienzan poco tiempo despus de vacunarse y duran 1  2 das. Problemas moderados: Los nios pequeos que reciben la vacuna contra la influenza desactivada y la vacuna antineumoccica (PCV13) durante la misma cita parecen correr mayor riego de tener convulsiones por causa de fiebre. Consulte a su doctor para ms informacin. Diga a su doctor si el nio que est recibiendo la vacuna contra la influenza ha tenido una convulsin. Problemas graves:  Las reacciones alrgicas que amenazan la vida ocurren muy rara vez despus de la vacunacin. Si ocurren, por lo general es a los W[employer] Corporation o a las pocas horas de haberse vacunado.  En 1976, un tipo de vacuna contra la influenza (gripe porcina) estuvo asociado al sndrome de Guillain-Barr (GBS). Desde entonces, las vacunas contra la influenza no se han asociado claramente al GBS. Sin embargo, si hay un riesgo de GBS por las vacunas contra la influenza que se usan actualmente, no debe ser ms de 1  2 casos por milln de personas vacunadas. Eso es Costco Wholesale que el riesgo de tener una influenza fuerte, que se puede prevenir con vacunacin. Siempre se seguir prestando atencin a la seguridad de las vacunas. Para ms informacin visite:  PrintingMaps.se y  https://www.farmer-stevens.info/ Raphael Gibney de la vacuna desactivada contra la influenza, llamada [place], no se debe dar a nios menores de 8 aos de edad, con la excepcin de circunstancias especiales. En [place] una vacuna relacionada estuvo asociada a fiebre y convulsiones febriles en nios pequeos. Su doctor le puede proporcionar ms informacin. QU PASA SI HAY  UNA REACCIN GRAVE? A qu debo prestar atencin? Cualquier estado poco habitual, como fiebre alta o cambios en el comportamiento. Los signos de [place] reaccin alrgica grave pueden incluir dificultad para respirar, ronquera o sibilancias, ronchas, palidez, debilidad, latidos cardacos acelerados, o mareos. Qu debo hacer?  Llame a un doctor o lleve a la persona inmediatamente a un doctor.  Dga a su doctor lo que ocurri, la fecha y hora en que ocurri, y cuando recibi la vacuna.  Pida a su mdico, enfermero o al departamento de salud, que informe sobre la reaccin llenando un formulario del Sistema de Informacin de Reacciones Adversos a las Administrator, arts (VAERS, por sus siglas en ingls). O, puede presentar este informe mediante el sitio Web de VAERS, en:www.vaers.LAgents.no o llamando al: (703)333-8544. VAERS no proporciona consejos mdicos. PROGRAMA NACIONAL DE COMPENSACIN POR LESIONES CAUSADAS POR VACUNAS El [place] de Compensacin por Lesiones Causadas por las Vacunas (VICP) fue creado en 1986.  Las personas que piensan haber sido lesionadas por alguna vacuna pueden aprender acerca del programa y cmo presentar una reclamacin llamando al: 1-818-163-4589 o visitando el sitio Web de VICP [place]Automobile.ch CMO Roxan Diesel MS INFORMACIN?  Consulte a su doctor. Le pueden dar el folleto de informacin que viene con la vacuna o sugerirle otras fuentes de informacin.  Llame al departamento de salud local o estatal.  Comunquese con los Centros para el Control y la Prevencin de [place] (CDC):  Llame al ([phone] (1-800-CDC-INFO) o  Visite el sitio Web de los CDC en BiotechRoom.com.cy CDC Inactivated Influenza Vaccine-Spanish VIS (02/24/11) Document Released: 11/07/2008 Document Revised: 11/03/2011 [facility] Patient Information 2013 [place], [place].

## 2012-08-10 NOTE — Addendum Note (Signed)
Addended by: Bertram Savin A on: 08/10/2012 09:30 AM   Modules accepted: Orders

## 2012-08-10 NOTE — Progress Notes (Signed)
Patient is a 46 year old who presented to the office today stating that she has vulvar irritation especially at the termination of her urine. She went to the urgent care in December 3 in a wet prep demonstrated evidence of bacterial vaginosis and was placed on Flagyl 500 mg twice a day for 7 days. Patient still having symptoms. Denies any fever chills nausea or vomiting or any back pain or abdominal pain. Patient is married in a monogamous relationship and has not been sexually active in over a month and her husband surgery. Urinalysis at the urgent care as well as urinalysis today was negative.  Exam: Bartholin urethra Skene was within normal limits Vagina: No lesions or discharge Cervix: No lesions or discharge Bimanual exam not done Rectal exam: Not done  Wet prep: Few clue cells present with numerous to count bacteria and few WBC.  Assessment for/plan: Incomplete resolution to bacterial vaginosis which was treated with by mouth with  Flagyl at the urgent care. Patient will be placed on MetroGel to apply intravaginally twice a day for 5 days. She was given a few samples of Uribell anti-spasmodic agent as we wait the urine culture. Patient requesting a flu vaccine today.Marland Kitchen

## 2012-08-12 LAB — URINE CULTURE
Colony Count: NO GROWTH
Organism ID, Bacteria: NO GROWTH

## 2012-08-27 ENCOUNTER — Telehealth: Payer: Self-pay | Admitting: *Deleted

## 2012-08-27 DIAGNOSIS — Z3042 Encounter for surveillance of injectable contraceptive: Secondary | ICD-10-CM

## 2012-08-27 MED ORDER — MEDROXYPROGESTERONE ACETATE 150 MG/ML IM SUSP: 150.0000 mg | INTRAMUSCULAR | Status: DC

## 2012-08-27 NOTE — Telephone Encounter (Signed)
rx sent

## 2012-08-27 NOTE — Telephone Encounter (Signed)
Message copied by Aura Camps on Fri Aug 27, 2012 11:35 AM ------      Message from: Jerilynn Mages      Created: Fri Aug 27, 2012 11:24 AM      Regarding: RX       I made this patient an appointment for her depo shot for 09-02-12 can you please call in RX for depo to [facility] on [address]. thx

## 2012-09-02 ENCOUNTER — Ambulatory Visit (INDEPENDENT_AMBULATORY_CARE_PROVIDER_SITE_OTHER): Payer: BC Managed Care – PPO | Admitting: Anesthesiology

## 2012-09-02 DIAGNOSIS — N949 Unspecified condition associated with female genital organs and menstrual cycle: Secondary | ICD-10-CM

## 2012-09-02 DIAGNOSIS — N938 Other specified abnormal uterine and vaginal bleeding: Secondary | ICD-10-CM

## 2012-09-02 MED ORDER — MEDROXYPROGESTERONE ACETATE 150 MG/ML IM SUSP
150.0000 mg | Freq: Once | INTRAMUSCULAR | Status: AC
  Administered 2012-09-02: 150 mg via INTRAMUSCULAR

## 2012-11-13 ENCOUNTER — Ambulatory Visit (INDEPENDENT_AMBULATORY_CARE_PROVIDER_SITE_OTHER): Payer: BC Managed Care – PPO | Admitting: Physician Assistant

## 2012-11-13 VITALS — BP 160/85 | HR 70 | Temp 98.0°F | Resp 16 | Ht 59.75 in | Wt 134.0 lb

## 2012-11-13 DIAGNOSIS — N390 Urinary tract infection, site not specified: Secondary | ICD-10-CM

## 2012-11-13 DIAGNOSIS — R300 Dysuria: Secondary | ICD-10-CM

## 2012-11-13 LAB — POCT UA - MICROSCOPIC ONLY: Crystals, Ur, HPF, POC: NEGATIVE

## 2012-11-13 LAB — POCT URINALYSIS DIPSTICK
Ketones, UA: NEGATIVE
Protein, UA: 30
Spec Grav, UA: 1.02
Urobilinogen, UA: 4
pH, UA: 7.5

## 2012-11-13 MED ORDER — PHENAZOPYRIDINE HCL 200 MG PO TABS: 200.0000 mg | ORAL_TABLET | Freq: Three times a day (TID) | ORAL | Status: DC | PRN

## 2012-11-13 MED ORDER — NITROFURANTOIN MONOHYD MACRO 100 MG PO CAPS: 100.0000 mg | ORAL_CAPSULE | Freq: Two times a day (BID) | ORAL | Status: DC

## 2012-11-13 NOTE — Patient Instructions (Addendum)
Begin taking the antibiotic medication as directed.  Be sure to take the full course.  Drink plenty of water.  You may also take the pyridium three time per day if needed for symptoms (this will turn your urine a funny color).  If you are worsening or not improving, please let us know   Infeccin urinaria  (Urinary Tract Infection)  La infeccin urinaria puede ocurrir en cualquier lugar del tracto urinario. El tracto urinario es un sistema de drenaje del cuerpo por el que se eliminan los desechos y el exceso de [place]. El tracto urinario [place] riones, dos urteres, la vejiga y Engineer, mining. Los riones son rganos que tienen forma de frijol. Cada rin tiene aproximadamente el tamao del puo. Estn situados debajo de las [place], uno a cada lado de la columna vertebral CAUSAS  La causa de la infeccin son los microbios, que son organismos microscpicos, que incluyen hongos, virus, y bacterias. Estos organismos son tan pequeos que slo pueden verse a travs del microscopio. Las bacterias son los microorganismos que ms comnmente causan infecciones urinarias.  SNTOMAS  Los sntomas pueden variar segn la edad y el sexo del paciente y por la ubicacin de la infeccin. Los sntomas en las mujeres jvenes incluyen la necesidad frecuente e intensa de orinar y una sensacin dolorosa de ardor en la vejiga o en la uretra durante la miccin. Las mujeres y los hombres mayores podrn sentir cansancio, temblores y debilidad y Futures trader musculares y Engineer, mining abdominal. Si tiene [place], puede significar que la infeccin est en los riones. Otros sntomas son dolor en la espalda o en los lados debajo de las [place], nuseas y vmitos.  DIAGNSTICO  Para diagnosticar una infeccin urinaria, el mdico le preguntar acerca de sus sntomas. Genuine [employer] una [place] de [place]. La muestra de orina se analiza para Engineer, manufacturing bacterias y glbulos blancos de Risk manager. Los glbulos blancos se forman en el  organismo para ayudar a Artist las infecciones.  TRATAMIENTO  Por lo general, las infecciones urinarias pueden tratarse con medicamentos. Debido a que la Harley-Davidson de las infecciones son causadas por bacterias, por lo general pueden tratarse con antibiticos. La eleccin del antibitico y la duracin del tratamiento depender de sus sntomas y el tipo de bacteria causante de la infeccin.  INSTRUCCIONES PARA EL CUIDADO EN EL HOGAR   Si le recetaron antibiticos, tmelos exactamente como su mdico le indique. Termine el medicamento aunque se sienta mejor despus de haber tomado slo algunos.  Beba gran cantidad de lquido para mantener la orina de tono claro o color amarillo plido.  Evite la cafena, el t y las [address]. Estas sustancias irritan la vejiga.  Vaciar la vejiga con frecuencia. Evite retener la orina durante largos perodos.  Vace la vejiga antes y despus de Management consultant.  Despus de mover el intestino, las mujeres deben higienizarse la regin perineal desde adelante hacia atrs. Use slo un papel tissue por vez. SOLICITE ATENCIN MDICA SI:   Siente dolor en la espalda.  Le sube la fiebre.  Los sntomas no mejoran luego de [address]. SOLICITE ATENCIN MDICA DE INMEDIATO SI:   Siente dolor intenso en la espalda o en la zona inferior del abdomen.  Comienza a sentir escalofros.  Tiene nuseas o vmitos.  Tiene una sensacin continua de quemazn o molestias al ConocoPhillips. ASEGRESE DE QUE:   Comprende estas instrucciones.  Controlar su enfermedad.  Solicitar ayuda de inmediato si no mejora o si empeora. Document Released: 05/21/2005  Document Revised: 02/10/2012 [facility] Patient Information 2013 [place], [place].

## 2012-11-13 NOTE — Progress Notes (Signed)
Subjective:    Patient ID: Autumn Haley, female    DOB: 11-Mar-1966, 47 y.o.   MRN: [id number]  HPI   Autumn Haley is a pleasant 47 yr old female here with concern for UTI or kidney infection.  She has a history of both.  Has had pain with urination and frequency for 7 days now.  Has noted a little hematuria.  Some lower abd pain and slight right flank pain.  Chills but no fever.  No nausea or vomiting.  Denies vaginal symptoms.  Has been trying to stay well hydrated with water and cranberry juice, but does say she's a little afraid to drink lots of fluids because it's so painful to urinate.     Review of Systems  Constitutional: Positive for chills. Negative for fever.  HENT: Negative.   Respiratory: Negative.   Cardiovascular: Negative.   Gastrointestinal: Positive for abdominal pain (lower). Negative for nausea and vomiting.  Genitourinary: Positive for dysuria, frequency, hematuria and flank pain (right). Negative for vaginal discharge.  Musculoskeletal: Negative.   Skin: Negative.   Neurological: Negative.        Objective:   Physical Exam  Vitals reviewed. Constitutional: She is oriented to person, place, and time. She appears well-developed and well-nourished. No distress.  HENT:  Head: Normocephalic and atraumatic.  Eyes: Conjunctivae are normal. No scleral icterus.  Cardiovascular: Normal rate, regular rhythm and normal heart sounds.   Pulmonary/Chest: Effort normal and breath sounds normal.  Abdominal: Soft. Bowel sounds are normal. There is tenderness in the suprapubic area. There is no CVA tenderness.  Neurological: She is alert and oriented to person, place, and time.  Skin: Skin is warm and dry.  Psychiatric: She has a normal mood and affect.     Filed Vitals:   11/13/12 0832  BP: 160/85  Pulse: 70  Temp: 98 F (36.7 C)  Resp: 16     Results for orders placed in visit on 11/13/12  POCT UA - MICROSCOPIC ONLY      Result Value Range   WBC, Ur, HPF,  POC tntc     RBC, urine, microscopic 1-5     Bacteria, U Microscopic 4++     Mucus, UA neg     Epithelial cells, urine per micros 0-2     Crystals, Ur, HPF, POC neg     Casts, Ur, LPF, POC renal tubular     Yeast, UA neg    POCT URINALYSIS DIPSTICK      Result Value Range   Color, UA yellow     Clarity, UA turbid     Glucose, UA neg     Bilirubin, UA neg     Ketones, UA neg     Spec Grav, UA 1.020     Blood, UA small     pH, UA 7.5     Protein, UA 30     Urobilinogen, UA 4.0     Nitrite, UA positive     Leukocytes, UA large (3+)          Assessment & Plan:  UTI (urinary tract infection) - Plan: Urine culture, nitrofurantoin, macrocrystal-monohydrate, (MACROBID) 100 MG capsule  Dysuria - Plan: POCT UA - Microscopic Only, POCT urinalysis dipstick, phenazopyridine (PYRIDIUM) 200 MG tablet  Autumn Haley is a pleasant 47 yr old female with UTI.  Pt complains of a minimal amount of right flank pain, but there is no CVA tenderness.  No fever, nausea or vomiting.  Suspect that this is UTI only  and not pyelo.  Pt has allergy to levaquin, so will avoid cipro as well and treat with macrobid/  Pyridium for symptoms if needed.  Push fluids.  If worsening or not improving, pt will RTC.

## 2012-11-16 ENCOUNTER — Telehealth: Payer: Self-pay | Admitting: *Deleted

## 2012-11-16 DIAGNOSIS — Z3042 Encounter for surveillance of injectable contraceptive: Secondary | ICD-10-CM

## 2012-11-16 LAB — URINE CULTURE

## 2012-11-16 MED ORDER — MEDROXYPROGESTERONE ACETATE 150 MG/ML IM SUSP: 150.0000 mg | INTRAMUSCULAR | Status: DC

## 2012-11-16 NOTE — Telephone Encounter (Signed)
Pt has annual schedule on 11/22/12.

## 2012-11-16 NOTE — Telephone Encounter (Signed)
Message copied by Aura Camps on Tue Nov 16, 2012 12:04 PM ------      Message from: Jerilynn Mages      Created: Tue Nov 16, 2012 10:43 AM      Regarding: depo shot       Can you please call in depo shot at [facility]. Patient is coming on Thursday for her shot. Thx ------

## 2012-11-18 ENCOUNTER — Ambulatory Visit (INDEPENDENT_AMBULATORY_CARE_PROVIDER_SITE_OTHER): Payer: BC Managed Care – PPO | Admitting: Anesthesiology

## 2012-11-18 DIAGNOSIS — N949 Unspecified condition associated with female genital organs and menstrual cycle: Secondary | ICD-10-CM

## 2012-11-18 DIAGNOSIS — N938 Other specified abnormal uterine and vaginal bleeding: Secondary | ICD-10-CM

## 2012-11-18 MED ORDER — MEDROXYPROGESTERONE ACETATE 150 MG/ML IM SUSP
150.0000 mg | Freq: Once | INTRAMUSCULAR | Status: AC
  Administered 2012-11-18: 150 mg via INTRAMUSCULAR

## 2012-11-22 ENCOUNTER — Telehealth: Payer: Self-pay | Admitting: *Deleted

## 2012-11-22 ENCOUNTER — Encounter: Payer: Self-pay | Admitting: Gynecology

## 2012-11-22 ENCOUNTER — Ambulatory Visit (INDEPENDENT_AMBULATORY_CARE_PROVIDER_SITE_OTHER): Payer: BC Managed Care – PPO | Admitting: Gynecology

## 2012-11-22 VITALS — BP 128/78 | Ht 60.25 in | Wt 135.0 lb

## 2012-11-22 DIAGNOSIS — N9489 Other specified conditions associated with female genital organs and menstrual cycle: Secondary | ICD-10-CM

## 2012-11-22 DIAGNOSIS — Z01419 Encounter for gynecological examination (general) (routine) without abnormal findings: Secondary | ICD-10-CM

## 2012-11-22 DIAGNOSIS — R635 Abnormal weight gain: Secondary | ICD-10-CM

## 2012-11-22 DIAGNOSIS — D259 Leiomyoma of uterus, unspecified: Secondary | ICD-10-CM

## 2012-11-22 DIAGNOSIS — R102 Pelvic and perineal pain: Secondary | ICD-10-CM

## 2012-11-22 LAB — COMPREHENSIVE METABOLIC PANEL
ALT: 24 U/L (ref 0–35)
AST: 19 U/L (ref 0–37)
Alkaline Phosphatase: 59 U/L (ref 39–117)
BUN: 14 mg/dL (ref 6–23)
Chloride: 106 mEq/L (ref 96–112)
Glucose, Bld: 94 mg/dL (ref 70–99)
Potassium: 4.2 mEq/L (ref 3.5–5.3)
Total Protein: 6.6 g/dL (ref 6.0–8.3)

## 2012-11-22 LAB — CBC WITH DIFFERENTIAL/PLATELET
Basophils Relative: 1 % (ref 0–1)
Eosinophils Relative: 1 % (ref 0–5)
HCT: 42.1 % (ref 36.0–46.0)
Hemoglobin: 14.1 g/dL (ref 12.0–15.0)
MCHC: 33.5 g/dL (ref 30.0–36.0)
MCV: 89.2 fL (ref 78.0–100.0)
Monocytes Absolute: 0.8 10*3/uL (ref 0.1–1.0)
Monocytes Relative: 8 % (ref 3–12)
Neutro Abs: 6.7 10*3/uL (ref 1.7–7.7)

## 2012-11-22 LAB — LIPID PANEL
HDL: 44 mg/dL (ref >39)
LDL Cholesterol: 97 mg/dL (ref 0–99)
Total CHOL/HDL Ratio: 3.4 Ratio
VLDL: 10 mg/dL (ref 0–40)

## 2012-11-22 LAB — URINALYSIS W MICROSCOPIC + REFLEX CULTURE
Glucose, UA: NEGATIVE mg/dL
Hgb urine dipstick: NEGATIVE
Leukocytes, UA: NEGATIVE
Nitrite: NEGATIVE
Protein, ur: NEGATIVE mg/dL
Urobilinogen, UA: 0.2 mg/dL (ref 0.0–1.0)

## 2012-11-22 LAB — TSH: TSH: 1.08 u[IU]/mL (ref 0.350–4.500)

## 2012-11-22 NOTE — Patient Instructions (Addendum)
Cistitis intersticial (Interstitial Cystitis) La cistitis intersticial (CI) es una enfermedad que causa molestias o dolor en la vejiga y la regin circundante de la pelvis. Los sntomas pueden ser diferentes de un caso a otro, e incluso en un mismo individuo. Las personas pueden experimentar:  Sports coach.  Presin.  Sensibilidad.  Intenso dolor en la vejiga y el rea plvica. CAUSAS Debido a que los sntomas y la gravedad varan tanto, los investigadores creen que no se trata de [place], sino de varias enfermedades. Algunos mdicos utilizan el trmino sndrome de vejiga dolorosa para describir los casos con sntomas urinarios dolorosos. Esto puede no satisfacer la definicin ms estricta . El trmino abarca todos los casos del dolor urinario que no se pueden relacionar con otras causas, como infecciones o clculos renales.  SNTOMAS Los sntomas pueden ser:  Necesidad urgente de Garment/textile technologist.  Necesidad frecuente de Garment/textile technologist.  Combinacin de estos sntomas. El dolor puede variar en intensidad a medida que la vejiga llena de [place] o se vaca. Los sntomas en las mujeres suelen empeorar durante la Flasher. A veces puede experimentar un dolor durante el coito vaginal. Algunos de sntomas se asemejan a los de la infeccin bacteriana. Las pruebas no muestran la afeccin. Este problema es mucho ms frecuente en mujeres que en hombres.  DIAGNSTICO El diagnstico se basa en:  Dolor relacionado con la vejiga, por lo general junto con los problemas de frecuencia y [place] para Garment/textile technologist.  No se hallan otras enfermedades que podran causar los sntomas.  Las pruebas de diagnstico que ayudan a Paramedic otras enfermedades:  Anlisis de [place].  Cultivo de [place].  Cistoscopia.  Biopsia de la pared de la vejiga.  Distensin de la vejiga bajo anestesia.  Citologa de la orina.  Exmenes de laboratorio de las secreciones de la prstata. Ardelia Mems biopsia es [place] de tejido que puede ser  observado bajo un microscopio. Durante una cistoscopia pueden tomarse muestras de la vejiga y Geologist, engineering. Una biopsia ayuda a Research officer, trade union de la vejiga. TRATAMIENTO Los cientficos no han encontrado todava una cura para este problema. Los parientes con no mejoran con el tratamiento antibitico. Los mdicos no pueden predecir quin va a responder mejor con dichos tratamientos. Los sntomas pueden desaparecer sin explicacin. La desaparicin de los sntomas puede coincidir con un evento como un cambio en la dieta o un tratamiento. Incluso cuando los sntomas desaparecen, pueden regresar en das, semanas, meses o [place].  Debido a que las causas se desconocen, los tratamientos actuales estn dirigidos a Herbalist sntomas. Muchas personas reciben [place] de uno o una combinacin de tratamientos. A medida que los investigadores aprenden ms [employer], la lista de tratamientos posibles Quarry manager. Los pacientes deben discutir las opciones con su mdico. CIRUGA  La ciruga debe considerarse slo si todos los tratamientos disponibles han fallado y Conservation officer, historic buildings es discapacitante. Muchas propuestas y tcnicas son [place]. Cada propuesta tiene sus propias ventajas y complicaciones. Las ventajas y las complicaciones se deben discutir con Heritage manager. Su mdico puede recomendar la consulta a otro urlogo para una segunda opinin. La mayora de los mdicos son renuentes a operar porque el resultado es imprevisible. Algunas personas [employer] sntomas despus de la [place].  Las personas que consideran la posibilidad de una ciruga deben hablar sobre los posibles riesgos y beneficios, efectos secundarios y complicaciones a lo largo y corto plazo con sus familiares, as como con las personas que ya han sido sometidas al procedimiento. La ciruga requiere anestesia, hospitalizacin y  en algunos casos semanas o meses de recuperacin. A medida que la complejidad del procedimiento aumenta, tambin hay  posibilidades de complicaciones y fallos. INSTRUCCIONES PARA EL CUIDADO DOMICILIARIO  [employer], incluso los de [place], tienen efectos secundarios. Los pacientes siempre deben Science writer a un mdico antes de Dietitian durante un Psychologist, forensic. Slo tome medicamentos de venta libre o recetados para Chief Technology Officer, Dentist o fiebre, como le indica el mdico.  Muchos pacientes sienten que fumar empeora sus sntomas. Se desconocen cmo los subproductos de tabaco que se excretan en la orina pueden afectar la este trastorno. Fumar es la principal causa conocida de cncer de vejiga. Una de las mejores cosas que los fumadores pueden hacer por su vejiga y su estado general de salud es dejar de fumar.  Muchos pacientes sienten que los ejercicios suaves de estiramiento pueden ayudar a Paramedic los sntomas.  Los mtodos varan, pero bsicamente los pacientes deciden vaciar la vejiga en momentos determinados y usar las tcnicas de relajacin y distracciones para mantener el horario establecido. Poco a Academic librarian, tratan de Artist [employer] vaciados programados. Un diario en el que registre los tiempos de vaciamiento suele ser til para seguir el progreso. ASEGURESE DE QUE:   Comprende estas instrucciones.  Controlar su enfermedad.  Solicitar ayuda de inmediato si no mejora o si empeora. Document Released: 11/27/2008 Document Revised: 11/03/2011 [facility] Patient Information 2013 [place], [place].  Tetanus, Diphtheria, Pertussis (Tdap) Vaccine What You Need to Know WHY GET VACCINATED? Tetanus, diphtheria and pertussis can be very serious diseases, even for adolescents and adults. Tdap vaccine can protect [place] from these diseases. TETANUS (Lockjaw) causes painful muscle tightening and stiffness, usually all over the body.  It can lead to tightening of muscles in the head and neck so you can't open your mouth, swallow, or sometimes even breathe. Tetanus kills about 1 out of  5 people who are infected. DIPHTHERIA can cause a thick coating to form in the back of the throat.  It can lead to breathing problems, paralysis, heart failure, and death. PERTUSSIS (Whooping Cough) causes severe coughing spells, which can cause difficulty breathing, vomiting and disturbed sleep.  It can also lead to weight loss, incontinence, and rib fractures. Up to 2 in 100 adolescents and 5 in 100 adults with pertussis are hospitalized or have complications, which could include pneumonia and death. These diseases are caused by bacteria. Diphtheria and pertussis are spread from person to person through coughing or sneezing. Tetanus enters the body through cuts, scratches, or wounds. Before vaccines, the [place] States saw as many as 200,000 cases a year of diphtheria and pertussis, and hundreds of cases of tetanus. Since vaccination began, tetanus and diphtheria have dropped by about 99% and pertussis by about 80%. TDAP VACCINE Tdap vaccine can protect adolescents and adults from tetanus, diphtheria, and pertussis. One dose of Tdap is routinely given at age 27 or 5. People who did not get Tdap at that age should get it as soon as possible. Tdap is especially important for health care professionals and anyone having close contact with a baby younger than 12 months. Pregnant women should get a dose of Tdap during every pregnancy, to protect the newborn from pertussis. Infants are most at risk for severe, life-threatening complications from pertussis. A similar vaccine, called Td, protects from tetanus and diphtheria, but not pertussis. A Td booster should be given every 10 years. Tdap may be given as one of these boosters if you have not  already gotten a dose. Tdap may also be given after a severe cut or burn to prevent tetanus infection. Your doctor can give you more information. Tdap may safely be given at the same time as other vaccines. SOME PEOPLE SHOULD NOT GET THIS VACCINE  If you ever had  a life-threatening allergic reaction after a dose of any tetanus, diphtheria, or pertussis containing vaccine, OR if you have a severe allergy to any part of this vaccine, you should not get Tdap. Tell your doctor if you have any severe allergies.  If you had a coma, or long or multiple seizures within 7 days after a childhood dose of DTP or DTaP, you should not get Tdap, unless a cause other than the vaccine was found. You can still get Td.  Talk to your doctor if you:  have epilepsy or another nervous system problem,  had severe pain or swelling after any vaccine containing diphtheria, tetanus or pertussis,  ever had Guillain-Barr Syndrome (GBS),  aren't feeling well on the day the shot is scheduled. RISKS OF A VACCINE REACTION With any medicine, including vaccines, there is a chance of side effects. These are usually mild and go away on their own, but serious reactions are also possible. Brief fainting spells can follow a vaccination, leading to injuries from falling. Sitting or lying down for about 15 minutes can help prevent these. Tell your doctor if you feel dizzy or light-headed, or have vision changes or ringing in the ears. Mild problems following Tdap (Did not interfere with activities)  Pain where the shot was given (about 3 in 4 adolescents or 2 in 3 adults)  Redness or swelling where the shot was given (about 1 person in 5)  Mild fever of at least 100.63F (up to about 1 in 25 adolescents or 1 in 100 adults)  Headache (about 3 or 4 people in 10)  Tiredness (about 1 person in 3 or 4)  Nausea, vomiting, diarrhea, stomach ache (up to 1 in 4 adolescents or 1 in 10 adults)  Chills, body aches, sore joints, rash, swollen glands (uncommon) Moderate problems following Tdap (Interfered with activities, but did not require medical attention)  Pain where the shot was given (about 1 in 5 adolescents or 1 in 100 adults)  Redness or swelling where the shot was given (up to about 1  in 16 adolescents or 1 in 25 adults)  Fever over 102F (about 1 in 100 adolescents or 1 in 250 adults)  Headache (about 3 in 20 adolescents or 1 in 10 adults)  Nausea, vomiting, diarrhea, stomach ache (up to 1 or 3 people in 100)  Swelling of the entire arm where the shot was given (up to about 3 in 100). Severe problems following Tdap (Unable to perform usual activities, required medical attention)  Swelling, severe pain, bleeding and redness in the arm where the shot was given (rare). A severe allergic reaction could occur after any vaccine (estimated less than 1 in a million doses). WHAT IF THERE IS A SERIOUS REACTION? What should I look for?  Look for anything that concerns you, such as signs of a severe allergic reaction, very high fever, or behavior changes. Signs of a severe allergic reaction can include hives, swelling of the face and throat, difficulty breathing, a fast heartbeat, dizziness, and weakness. These would start a few minutes to a few hours after the vaccination. What should I do?  If you think it is a severe allergic reaction or other emergency that  can't wait, call 9-1-1 or get the person to the nearest hospital. Otherwise, call your doctor.  Afterward, the reaction should be reported to the "Vaccine Adverse Event Reporting System" (VAERS). Your doctor might file this report, or you can do it yourself through the VAERS web site at www.vaers.LAgents.no, or by calling 1-704-633-8322. VAERS is only for reporting reactions. They do not give medical advice.  THE NATIONAL VACCINE INJURY COMPENSATION PROGRAM The National Vaccine Injury Compensation Program (VICP) is a federal program that was created to compensate people who may have been injured by certain vaccines. Persons who believe they may have been injured by a vaccine can learn about the program and about filing a claim by calling 1-(614) 214-7011 or visiting the VICP website at SpiritualWord.at. HOW CAN I  LEARN MORE?  Ask your doctor.  Call your local or state health department.  Contact the Centers for Disease Control and Prevention (CDC):  Call 442-130-5409 or visit CDC's website at PicCapture.uy CDC Tdap Vaccine VIS (01/01/12) Document Released: 02/10/2012 Document Reviewed: 02/10/2012 [facility] Patient Information 2013 [place], [place].

## 2012-11-22 NOTE — Progress Notes (Signed)
Autumn Haley 09-28-1965 [id number]   History:    47 y.o.  for annual gyn exam who has been concerned with issues concerning signs and symptoms of urinary tract infections. Review of her record indicated that last month she was treated at the urgent care for urinary tract infection whereby E. Coli was identified on several other occasion when she has come to the office with signs and symptoms suspicious for UTI there was no microorganisms identified. Patient last year was treated the urgent care for a left pyelonephritis. Patient does have history of fibroid uterus. She states at times she feels relieved after she avoids but has noted no blood in her urine and no stress urinary incontinence.  Patient's currently on Depo-Provera 150 mg IM every 3 months for contraception. She is taking her calcium vitamin D. Patient with no prior history of abnormal Pap smear. She's complained of weight gain. Her urinalysis today was negative.  Past medical history,surgical history, family history and social history were all reviewed and documented in the EPIC chart.  Gynecologic History No LMP recorded. Patient has had an injection. Contraception: Depo-Provera injections Last Pap: 2012. Results were: normal Last mammogram: 2013. Results were: normal  Obstetric History OB History   Grav Para Term Preterm Abortions TAB SAB Ect Mult Living   3 2 2  1  1   2      # Outc Date GA Lbr Len/2nd Wgt Sex Del Anes PTL Lv   1 TRM     M SVD  No Yes   2 TRM     F SVD  No Yes   3 SAB                ROS: A ROS was performed and pertinent positives and negatives are included in the history.  GENERAL: No fevers or chills. HEENT: No change in vision, no earache, sore throat or sinus congestion. NECK: No pain or stiffness. CARDIOVASCULAR: No chest pain or pressure. No palpitations. PULMONARY: No shortness of breath, cough or wheeze. GASTROINTESTINAL: No abdominal pain, nausea, vomiting or diarrhea, melena or bright red  blood per rectum. GENITOURINARY: No urinary frequency, urgency, hesitancy or dysuria. MUSCULOSKELETAL: No joint or muscle pain, no back pain, no recent trauma. DERMATOLOGIC: No rash, no itching, no lesions. ENDOCRINE: No polyuria, polydipsia, no heat or cold intolerance. No recent change in weight. HEMATOLOGICAL: No anemia or easy bruising or bleeding. NEUROLOGIC: No headache, seizures, numbness, tingling or weakness. PSYCHIATRIC: No depression, no loss of interest in normal activity or change in sleep pattern.     Exam: chaperone present  BP 128/78  Ht 5' 0.25" (1.53 m)  Wt 135 lb (61.236 kg)  BMI 26.16 kg/m2  Body mass index is 26.16 kg/(m^2).  General appearance : Well developed well nourished female. No acute distress HEENT: Neck supple, trachea midline, no carotid bruits, no thyroidmegaly Lungs: Clear to auscultation, no rhonchi or wheezes, or rib retractions  Heart: Regular rate and rhythm, no murmurs or gallops Breast:Examined in sitting and supine position were symmetrical in appearance, no palpable masses or tenderness,  no skin retraction, no nipple inversion, no nipple discharge, no skin discoloration, no axillary or supraclavicular lymphadenopathy Abdomen: no palpable masses or tenderness, no rebound or guarding Extremities: no edema or skin discoloration or tenderness  Pelvic:  Bartholin, Urethra, Skene Glands: Within normal limits             Vagina: No gross lesions or discharge  Cervix: No gross lesions or discharge  Uterus  10 week size, irregular shaped, non-tender and mobile  Adnexa  Without masses or tenderness  Anus and perineum  normal   Rectovaginal  normal sphincter tone without palpated masses or tenderness             Hemoccult  Not indicated     Assessment/Plan:  47 y.o. female for annual exam who will be referred to the urologist for further evaluation of possible underlying either interstitial cystitis. Patient who is reported in the past symptoms  suspicious for urinary tract infection per cultures have been negative. She had an episode a left pyelonephritis last year and a lower urinary tract infection in December 2013.She will return back to the office in May for followup ultrasound for her fibroids. In may of 2013 she had an ultrasound and sonohysterogram because she was having some breakthrough bleeding probably attributed to the Depo-Provera and no intracavitary defect was noted and her endometrial biopsy was benign.the following lab work today: CBC, fasting lipid profile, comprehensive metabolic panel, urinalysis, TSH and no Pap smear. The new Pap smear screening guidelines discussed. Patient did receive the Tdap vaccine today.  Ok Edwards MD, 1:48 PM 11/22/2012

## 2012-11-22 NOTE — Progress Notes (Signed)
NO MENSES DUE TO DEPO PROVERA

## 2012-11-22 NOTE — Telephone Encounter (Signed)
Message copied by Aura Camps on Mon Nov 22, 2012  2:35 PM ------      Message from: Ok Edwards      Created: Mon Nov 22, 2012  1:58 PM       Victorino Dike, please schedule an appointment for this patient with Dr. Arlana Hove urologist at [facility] urology. Patient with signs and symptoms suspicious for interstitial cystitis. You can fax them my last note. Thank you ------

## 2012-11-22 NOTE — Telephone Encounter (Signed)
appt with Dr. Sim Boast on 01/05/13 2 1:30 pm notes faxed to office. Debarah Crape will inform patient.

## 2013-01-05 ENCOUNTER — Ambulatory Visit (INDEPENDENT_AMBULATORY_CARE_PROVIDER_SITE_OTHER): Payer: BC Managed Care – PPO | Admitting: Family Medicine

## 2013-01-05 ENCOUNTER — Ambulatory Visit: Payer: BC Managed Care – PPO

## 2013-01-05 VITALS — BP 158/82 | HR 71 | Temp 98.0°F | Resp 17 | Ht 61.0 in | Wt 132.0 lb

## 2013-01-05 DIAGNOSIS — M79609 Pain in unspecified limb: Secondary | ICD-10-CM

## 2013-01-05 DIAGNOSIS — M25561 Pain in right knee: Secondary | ICD-10-CM

## 2013-01-05 MED ORDER — DICLOFENAC SODIUM 75 MG PO TBEC: 75.0000 mg | DELAYED_RELEASE_TABLET | Freq: Two times a day (BID) | ORAL | Status: DC

## 2013-01-05 NOTE — Patient Instructions (Signed)
Purchased a knee sleeve with a hole for the knee cap and wear that when you are working or up and around a lot.  Take the anti-inflammatory medication, diclofenac, one pill twice daily for pain and inflammation  If the knee continues to bother you after one week we will probably want to send you to a orthopedic doctor (knee specialist) for further evaluation.  Return sooner if it starts swelling or giving you a lot of trouble.

## 2013-01-05 NOTE — Progress Notes (Signed)
Subjective: Patient has been having problems with her knee for the last few days. She knows of no injury. However when she walks around she feels it popping and very slight pain in the medial aspect of the right knee. She has not had this in the past.  Objective: The right knee looked a little bigger than the left, but on palpation I cannot demonstrate any effusion there. Knee joint seems to be intact in all movements. I could not passively cause it to pop. She walked back and forth in the room and felt like she got a pop, and I do not hear it. She does have a little bit of pain when she squats.  Assessment: Right knee pain, etiology undetermined  Plan: X-ray right knee  [facility] reading (PRIMARY) by  Dr. Alwyn Ren Normal knee  Assessment: Knee popping and pain, possible small meniscus tear.  Plan: Suggest taking anti-inflammatory medication and wearing a knee sleeve for a week or so. She keeps having symptoms will refer to orthopedics.

## 2013-01-12 ENCOUNTER — Telehealth: Payer: Self-pay | Admitting: *Deleted

## 2013-01-12 DIAGNOSIS — Z3042 Encounter for surveillance of injectable contraceptive: Secondary | ICD-10-CM

## 2013-01-12 MED ORDER — MEDROXYPROGESTERONE ACETATE 150 MG/ML IM SUSP: 150.0000 mg | INTRAMUSCULAR | Status: DC

## 2013-01-12 NOTE — Telephone Encounter (Signed)
PT CALLED REQUESTING REFILL ON DEPO PROVERA SHOT, RX SENT WITH REFILLS, ANNUAL WAS IN [facility].

## 2013-01-13 ENCOUNTER — Ambulatory Visit: Payer: BC Managed Care – PPO

## 2013-02-10 ENCOUNTER — Ambulatory Visit (INDEPENDENT_AMBULATORY_CARE_PROVIDER_SITE_OTHER): Payer: BC Managed Care – PPO | Admitting: Anesthesiology

## 2013-02-10 DIAGNOSIS — N949 Unspecified condition associated with female genital organs and menstrual cycle: Secondary | ICD-10-CM

## 2013-02-10 DIAGNOSIS — N938 Other specified abnormal uterine and vaginal bleeding: Secondary | ICD-10-CM

## 2013-02-10 MED ORDER — MEDROXYPROGESTERONE ACETATE 150 MG/ML IM SUSP
150.0000 mg | Freq: Once | INTRAMUSCULAR | Status: AC
  Administered 2013-02-10: 150 mg via INTRAMUSCULAR

## 2013-04-06 ENCOUNTER — Encounter: Payer: Self-pay | Admitting: Urology

## 2013-04-29 ENCOUNTER — Ambulatory Visit (INDEPENDENT_AMBULATORY_CARE_PROVIDER_SITE_OTHER): Payer: BC Managed Care – PPO | Admitting: Anesthesiology

## 2013-04-29 DIAGNOSIS — N938 Other specified abnormal uterine and vaginal bleeding: Secondary | ICD-10-CM

## 2013-04-29 DIAGNOSIS — N949 Unspecified condition associated with female genital organs and menstrual cycle: Secondary | ICD-10-CM

## 2013-04-29 MED ORDER — MEDROXYPROGESTERONE ACETATE 150 MG/ML IM SUSP
150.0000 mg | Freq: Once | INTRAMUSCULAR | Status: AC
  Administered 2013-04-29: 150 mg via INTRAMUSCULAR

## 2013-07-15 ENCOUNTER — Ambulatory Visit (INDEPENDENT_AMBULATORY_CARE_PROVIDER_SITE_OTHER): Payer: BC Managed Care – PPO | Admitting: Anesthesiology

## 2013-07-15 DIAGNOSIS — N938 Other specified abnormal uterine and vaginal bleeding: Secondary | ICD-10-CM

## 2013-07-15 DIAGNOSIS — N949 Unspecified condition associated with female genital organs and menstrual cycle: Secondary | ICD-10-CM

## 2013-07-15 MED ORDER — MEDROXYPROGESTERONE ACETATE 150 MG/ML IM SUSP
150.0000 mg | Freq: Once | INTRAMUSCULAR | Status: AC
  Administered 2013-07-15: 150 mg via INTRAMUSCULAR

## 2013-07-28 ENCOUNTER — Ambulatory Visit (INDEPENDENT_AMBULATORY_CARE_PROVIDER_SITE_OTHER): Payer: BC Managed Care – PPO | Admitting: Anesthesiology

## 2013-07-28 ENCOUNTER — Other Ambulatory Visit: Payer: Self-pay

## 2013-07-28 DIAGNOSIS — Z1231 Encounter for screening mammogram for malignant neoplasm of breast: Secondary | ICD-10-CM

## 2013-07-28 DIAGNOSIS — Z23 Encounter for immunization: Secondary | ICD-10-CM

## 2013-09-02 ENCOUNTER — Ambulatory Visit
Admission: RE | Admit: 2013-09-02 | Discharge: 2013-09-02 | Disposition: A | Discharge status: Home / Self Care | Payer: BC Managed Care – PPO | Source: Ambulatory Visit

## 2013-09-02 DIAGNOSIS — Z1231 Encounter for screening mammogram for malignant neoplasm of breast: Secondary | ICD-10-CM

## 2013-10-04 ENCOUNTER — Telehealth: Payer: Self-pay | Admitting: *Deleted

## 2013-10-04 DIAGNOSIS — Z3042 Encounter for surveillance of injectable contraceptive: Secondary | ICD-10-CM

## 2013-10-04 MED ORDER — MEDROXYPROGESTERONE ACETATE 150 MG/ML IM SUSP: 150.0000 mg | INTRAMUSCULAR | Status: DC

## 2013-10-04 NOTE — Telephone Encounter (Signed)
Message copied by Thamas Jaegers on Tue Oct 04, 2013  2:24 PM ------      Message from: Sinclair Grooms      Created: Tue Oct 04, 2013 12:14 PM      Regarding: depo shot       Can you please call in depo shot for patient.  Her pharmacy needs to be change in the system.  Her new pharmacy is [facility] on [address]. Thx ------

## 2013-10-04 NOTE — Telephone Encounter (Signed)
rx sent to pharmacy

## 2013-10-06 ENCOUNTER — Ambulatory Visit (INDEPENDENT_AMBULATORY_CARE_PROVIDER_SITE_OTHER): Payer: BC Managed Care – PPO | Admitting: Anesthesiology

## 2013-10-06 DIAGNOSIS — N949 Unspecified condition associated with female genital organs and menstrual cycle: Secondary | ICD-10-CM

## 2013-10-06 DIAGNOSIS — N938 Other specified abnormal uterine and vaginal bleeding: Secondary | ICD-10-CM

## 2013-10-06 MED ORDER — MEDROXYPROGESTERONE ACETATE 150 MG/ML IM SUSP
150.0000 mg | Freq: Once | INTRAMUSCULAR | Status: AC
  Administered 2013-10-06: 150 mg via INTRAMUSCULAR

## 2013-10-10 ENCOUNTER — Ambulatory Visit: Payer: BC Managed Care – PPO

## 2013-10-10 ENCOUNTER — Ambulatory Visit (INDEPENDENT_AMBULATORY_CARE_PROVIDER_SITE_OTHER): Payer: BC Managed Care – PPO | Admitting: Internal Medicine

## 2013-10-10 VITALS — BP 140/80 | HR 67 | Temp 98.8°F | Resp 18 | Ht 61.0 in | Wt 132.0 lb

## 2013-10-10 DIAGNOSIS — S60211A Contusion of right wrist, initial encounter: Secondary | ICD-10-CM

## 2013-10-10 DIAGNOSIS — Z23 Encounter for immunization: Secondary | ICD-10-CM

## 2013-10-10 DIAGNOSIS — M25531 Pain in right wrist: Secondary | ICD-10-CM

## 2013-10-10 DIAGNOSIS — S61509A Unspecified open wound of unspecified wrist, initial encounter: Secondary | ICD-10-CM

## 2013-10-10 DIAGNOSIS — S61531A Puncture wound without foreign body of right wrist, initial encounter: Secondary | ICD-10-CM

## 2013-10-10 DIAGNOSIS — S60219A Contusion of unspecified wrist, initial encounter: Secondary | ICD-10-CM

## 2013-10-10 DIAGNOSIS — M25539 Pain in unspecified wrist: Secondary | ICD-10-CM

## 2013-10-10 MED ORDER — MUPIROCIN 2 % EX OINT
1.0000 | TOPICAL_OINTMENT | Freq: Three times a day (TID) | CUTANEOUS | Status: DC
Start: 2013-10-10 — End: 2013-11-03

## 2013-10-10 NOTE — Progress Notes (Signed)
   Subjective:    Patient ID: Autumn Haley, female    DOB: [birth year], 47 y.o.   MRN: [id number]  HPI Autumn Haley presents today right medial wrist pain in which she hit it on a counter at home 9 days ago. There is no prior injury. There is limited ROM with minor pain. She is worried of possible infection.  No effusion of joint, no loss of rom, no red or warmth. No other injury or pain mentioned. No loss of function of right wrist.  Review of Systems neg    Objective:   Physical Exam  Vitals reviewed. Constitutional: She is oriented to person, place, and time. She appears well-developed and well-nourished.  HENT:  Head: Normocephalic.  Nose: Nose normal.  Eyes: EOM are normal. No scleral icterus.  Neck: Normal range of motion. Neck supple.  Cardiovascular: Normal rate.   Pulmonary/Chest: Effort normal.  Musculoskeletal: She exhibits tenderness. She exhibits no edema.       Right wrist: She exhibits tenderness and laceration. She exhibits normal range of motion, no bony tenderness, no swelling, no effusion, no crepitus and no deformity.       Arms: 1 cm superficial wound, tender but no surrounding erythema, swelling.  Neurological: She is alert and oriented to person, place, and time. No cranial nerve deficit. Coordination normal.  Psychiatric: She has a normal mood and affect.    [facility] reading (PRIMARY) by  Dr.Analina Filla normal wrist xr  Wound care and dress   Tdap update     Assessment & Plan:  Mupirocin and soap and water BID Wrist splint

## 2013-10-10 NOTE — Patient Instructions (Signed)
Cuidados de una herida  (Wound Care)  El cuidado de la herida evita el dolor y la infeccin.  Deber aplicarse la vacuna contra el ttanos si:  No recuerda cundo se coloc la vacuna la ltima vez.  Nunca recibi esta vacuna.  La lesin ha [employer]. Si usted necesita aplicarse la vacuna y se niega a recibirla, corre riesgo de contraer ttanos. sta es una enfermedad grave.  CUIDADOS EN EL HOGAR   Slo tome la medicacin segn las indicaciones.  Limpie la herida [employer] con [place] y [place], segn las indicaciones.  Cambie el (vendaje) tal como se le indic.  Aplique una crema con medicamento sobre la herida, segn le indique el mdico.  Cmbielo si se moja, se ensucia o huele mal.  Puede tomar Collene Mares. No debe tomar baos de inmersin, nadar ni hacer nada que haga que la herida se encuentre debajo del agua.  Mantenga elevada y en reposo la zona lesionada hasta que el dolor y la hinchazn mejoren.  Cumpla con los controles mdicos segn las indicaciones. SOLICITE AYUDA DE INMEDIATO SI:   Observa que un lquido blanco amarillento (pus) aparece en la zona de la herida.  Los medicamentos no Buyer, retail.  Hay rayas rojas que salen de la herida.  Tiene fiebre. ASEGRESE DE QUE:   Comprende estas instrucciones.  Controlar su enfermedad.  Solicitar ayuda de inmediato si no mejora o si empeora. Document Released: 04/09/2011 Document Revised: 11/03/2011 [facility] Patient Information 2014 [place], [place]. Laceration Care, Adult A laceration is a cut or lesion that goes through all layers of the skin and into the tissue just beneath the skin. TREATMENT  Some lacerations may not require closure. Some lacerations may not be able to be closed due to an increased risk of infection. It is important to see your caregiver as soon as possible after an injury to minimize the risk of infection and maximize the opportunity for successful closure. If closure is  appropriate, pain medicines may be given, if needed. The wound will be cleaned to help prevent infection. Your caregiver will use stitches (sutures), staples, wound glue (adhesive), or skin adhesive strips to repair the laceration. These tools bring the skin edges together to allow for faster healing and a better cosmetic outcome. However, all wounds will heal with a scar. Once the wound has healed, scarring can be minimized by covering the wound with sunscreen during the day for 1 full year. HOME CARE INSTRUCTIONS  For sutures or staples:  Keep the wound clean and dry.  If you were given a bandage (dressing), you should change it at least once a day. Also, change the dressing if it becomes wet or dirty, or as directed by your caregiver.  Wash the wound with soap and water 2 times a day. Rinse the wound off with water to remove all soap. Pat the wound dry with a clean towel.  After cleaning, apply a thin layer of the antibiotic ointment as recommended by your caregiver. This will help prevent infection and keep the dressing from sticking.  You may shower as usual after the first 24 hours. Do not soak the wound in water until the sutures are removed.  Only take over-the-counter or prescription medicines for pain, discomfort, or fever as directed by your caregiver.  Get your sutures or staples removed as directed by your caregiver. For skin adhesive strips:  Keep the wound clean and dry.  Do not get the skin adhesive strips  wet. You may bathe carefully, using caution to keep the wound dry.  If the wound gets wet, pat it dry with a clean towel.  Skin adhesive strips will fall off on their own. You may trim the strips as the wound heals. Do not remove skin adhesive strips that are still stuck to the wound. They will fall off in time. For wound adhesive:  You may briefly wet your wound in the shower or bath. Do not soak or scrub the wound. Do not swim. Avoid periods of heavy perspiration until  the skin adhesive has fallen off on its own. After showering or bathing, gently pat the wound dry with a clean towel.  Do not apply liquid medicine, cream medicine, or ointment medicine to your wound while the skin adhesive is in place. This may loosen the film before your wound is healed.  If a dressing is placed over the wound, be careful not to apply tape directly over the skin adhesive. This may cause the adhesive to be pulled off before the wound is healed.  Avoid prolonged exposure to sunlight or tanning lamps while the skin adhesive is in place. Exposure to ultraviolet light in the first year will darken the scar.  The skin adhesive will usually remain in place for 5 to 10 days, then naturally fall off the skin. Do not pick at the adhesive film. You may need a tetanus shot if:  You cannot remember when you had your last tetanus shot.  You have never had a tetanus shot. If you get a tetanus shot, your arm may swell, get red, and feel warm to the touch. This is common and not a problem. If you need a tetanus shot and you choose not to have one, there is a rare chance of getting tetanus. Sickness from tetanus can be serious. SEEK MEDICAL CARE IF:   You have redness, swelling, or increasing pain in the wound.  You see a red line that goes away from the wound.  You have yellowish-white fluid (pus) coming from the wound.  You have a fever.  You notice a bad smell coming from the wound or dressing.  Your wound breaks open before or after sutures have been removed.  You notice something coming out of the wound such as wood or glass.  Your wound is on your hand or foot and you cannot move a finger or toe. SEEK IMMEDIATE MEDICAL CARE IF:   Your pain is not controlled with prescribed medicine.  You have severe swelling around the wound causing pain and numbness or a change in color in your arm, hand, leg, or foot.  Your wound splits open and starts bleeding.  You have worsening  numbness, weakness, or loss of function of any joint around or beyond the wound.  You develop painful lumps near the wound or on the skin anywhere on your body. MAKE SURE YOU:   Understand these instructions.  Will watch your condition.  Will get help right away if you are not doing well or get worse. Document Released: 08/11/2005 Document Revised: 11/03/2011 Document Reviewed: 02/04/2011 [facility] Patient Information 2014 [place], [place].

## 2013-10-27 ENCOUNTER — Encounter: Payer: BC Managed Care – PPO | Admitting: Gynecology

## 2013-11-03 ENCOUNTER — Encounter: Payer: Self-pay | Admitting: Gynecology

## 2013-11-03 ENCOUNTER — Ambulatory Visit (INDEPENDENT_AMBULATORY_CARE_PROVIDER_SITE_OTHER): Payer: BC Managed Care – PPO | Admitting: Gynecology

## 2013-11-03 ENCOUNTER — Other Ambulatory Visit (HOSPITAL_COMMUNITY)
Admission: RE | Admit: 2013-11-03 | Discharge: 2013-11-03 | Disposition: A | Discharge status: Home / Self Care | Payer: BC Managed Care – PPO | Source: Ambulatory Visit | Attending: Gynecology | Admitting: Gynecology

## 2013-11-03 VITALS — BP 130/86 | Ht 60.0 in | Wt 134.0 lb

## 2013-11-03 DIAGNOSIS — R300 Dysuria: Secondary | ICD-10-CM

## 2013-11-03 DIAGNOSIS — Z1151 Encounter for screening for human papillomavirus (HPV): Secondary | ICD-10-CM | POA: Insufficient documentation

## 2013-11-03 DIAGNOSIS — Z01419 Encounter for gynecological examination (general) (routine) without abnormal findings: Secondary | ICD-10-CM

## 2013-11-03 DIAGNOSIS — D259 Leiomyoma of uterus, unspecified: Secondary | ICD-10-CM

## 2013-11-03 DIAGNOSIS — R5383 Other fatigue: Secondary | ICD-10-CM

## 2013-11-03 DIAGNOSIS — N390 Urinary tract infection, site not specified: Secondary | ICD-10-CM

## 2013-11-03 DIAGNOSIS — R5381 Other malaise: Secondary | ICD-10-CM

## 2013-11-03 DIAGNOSIS — Z1159 Encounter for screening for other viral diseases: Secondary | ICD-10-CM

## 2013-11-03 LAB — COMPREHENSIVE METABOLIC PANEL
ALBUMIN: 3.9 g/dL (ref 3.5–5.2)
ALT: 17 U/L (ref 0–35)
AST: 15 U/L (ref 0–37)
Alkaline Phosphatase: 59 U/L (ref 39–117)
BUN: 12 mg/dL (ref 6–23)
CALCIUM: 8.7 mg/dL (ref 8.4–10.5)
CHLORIDE: 105 meq/L (ref 96–112)
CO2: 26 meq/L (ref 19–32)
Creat: 0.54 mg/dL (ref 0.50–1.10)
GLUCOSE: 102 mg/dL — AB (ref 70–99)
POTASSIUM: 4.1 meq/L (ref 3.5–5.3)
Sodium: 138 mEq/L (ref 135–145)
TOTAL PROTEIN: 6.7 g/dL (ref 6.0–8.3)
Total Bilirubin: 0.6 mg/dL (ref 0.2–1.2)

## 2013-11-03 LAB — URINALYSIS W MICROSCOPIC + REFLEX CULTURE
Bilirubin Urine: NEGATIVE
CRYSTALS: NONE SEEN
Casts: NONE SEEN
Glucose, UA: NEGATIVE mg/dL
KETONES UR: NEGATIVE mg/dL
NITRITE: POSITIVE — AB
Protein, ur: NEGATIVE mg/dL
UROBILINOGEN UA: 0.2 mg/dL (ref 0.0–1.0)
pH: 5.5 (ref 5.0–8.0)

## 2013-11-03 LAB — CBC WITH DIFFERENTIAL/PLATELET
Basophils Absolute: 0.1 10*3/uL (ref 0.0–0.1)
Basophils Relative: 1 % (ref 0–1)
Eosinophils Absolute: 0.1 10*3/uL (ref 0.0–0.7)
Eosinophils Relative: 1 % (ref 0–5)
HEMATOCRIT: 40.6 % (ref 36.0–46.0)
HEMOGLOBIN: 13.9 g/dL (ref 12.0–15.0)
LYMPHS ABS: 2.4 10*3/uL (ref 0.7–4.0)
LYMPHS PCT: 28 % (ref 12–46)
MCH: 30.3 pg (ref 26.0–34.0)
MCHC: 34.2 g/dL (ref 30.0–36.0)
MCV: 88.5 fL (ref 78.0–100.0)
MONO ABS: 0.7 10*3/uL (ref 0.1–1.0)
Monocytes Relative: 8 % (ref 3–12)
Neutro Abs: 5.2 10*3/uL (ref 1.7–7.7)
Neutrophils Relative %: 62 % (ref 43–77)
Platelets: 283 10*3/uL (ref 150–400)
RBC: 4.59 MIL/uL (ref 3.87–5.11)
RDW: 14.1 % (ref 11.5–15.5)
WBC: 8.4 10*3/uL (ref 4.0–10.5)

## 2013-11-03 LAB — LIPID PANEL
Cholesterol: 157 mg/dL (ref 0–200)
HDL: 53 mg/dL (ref >39)
LDL Cholesterol: 96 mg/dL (ref 0–99)
TRIGLYCERIDES: 42 mg/dL (ref <150)
Total CHOL/HDL Ratio: 3 Ratio
VLDL: 8 mg/dL (ref 0–40)

## 2013-11-03 MED ORDER — NITROFURANTOIN MONOHYD MACRO 100 MG PO CAPS: 100.0000 mg | ORAL_CAPSULE | Freq: Two times a day (BID) | ORAL | Status: DC

## 2013-11-03 NOTE — Patient Instructions (Addendum)
  Infeccin urinaria  (Urinary Tract Infection)  La infeccin urinaria puede ocurrir en Clinical cytogeneticist del tracto urinario. El tracto urinario es un sistema de drenaje del cuerpo por el que se eliminan los desechos y el exceso de [place]. El tracto urinario est formado por dos riones, dos urteres, la vejiga y Geologist, engineering. Los riones son rganos que tienen forma de frijol. Cada rin tiene aproximadamente el tamao del puo. Estn situados debajo de las [place], uno a cada lado de la columna vertebral CAUSAS  La causa de la infeccin son los microbios, que son organismos microscpicos, que incluyen hongos, virus, y bacterias. Estos organismos son tan pequeos que slo pueden verse a travs del microscopio. Las bacterias son los microorganismos que ms comnmente causan infecciones urinarias.  SNTOMAS  Los sntomas pueden variar segn la edad y el sexo del paciente y por la ubicacin de la infeccin. Los sntomas en las mujeres jvenes incluyen la necesidad frecuente e intensa de orinar y una sensacin dolorosa de ardor en la vejiga o en la uretra durante la miccin. Las mujeres y los hombres mayores podrn sentir cansancio, temblores y debilidad y Arts development officer musculares y Social research officer, government abdominal. Si tiene [place], puede significar que la infeccin est en los riones. Otros sntomas son dolor en la espalda o en los lados debajo de las [place], nuseas y vmitos.  DIAGNSTICO  Para diagnosticar una infeccin urinaria, el mdico le preguntar acerca de sus sntomas. [employer] una [place] de [place]. La muestra de orina se analiza para Hydrographic surveyor bacterias y glbulos blancos de Herbalist. Los glbulos blancos se forman en el organismo para ayudar a Radio broadcast assistant las infecciones.  TRATAMIENTO  Por lo general, las infecciones urinarias pueden tratarse con medicamentos. Debido a que la [employer] de las infecciones son causadas por bacterias, por lo general pueden tratarse con antibiticos. La eleccin del  antibitico y la duracin del tratamiento depender de sus sntomas y el tipo de bacteria causante de la infeccin.  INSTRUCCIONES PARA EL CUIDADO EN EL HOGAR   Si le recetaron antibiticos, tmelos exactamente como su mdico le indique. Termine el medicamento aunque se sienta mejor despus de haber tomado slo algunos.  Beba gran cantidad de lquido para mantener la orina de tono claro o color amarillo plido.  Evite la cafena, el t y las bebidas gaseosas. Estas sustancias irritan la vejiga.  Vaciar la vejiga con frecuencia. Evite retener la orina durante largos perodos.  Vace la vejiga antes y despus de Clinical biochemist.  Despus de mover el intestino, las mujeres deben higienizarse la regin perineal desde adelante hacia atrs. Use slo un papel tissue por vez. SOLICITE ATENCIN MDICA SI:   Siente dolor en la espalda.  Le sube la fiebre.  Los sntomas no mejoran luego de 3 das. SOLICITE ATENCIN MDICA DE INMEDIATO SI:   Siente dolor intenso en la espalda o en la zona inferior del abdomen.  Comienza a sentir escalofros.  Tiene nuseas o vmitos.  Tiene una sensacin continua de quemazn o molestias al [employer]. ASEGRESE DE QUE:   Comprende estas instrucciones.  Controlar su enfermedad.  Solicitar ayuda de inmediato si no mejora o empeora. Document Released: 05/21/2005 Document Revised: 05/05/2012 [facility] Patient Information 2014 [place], [place].

## 2013-11-03 NOTE — Progress Notes (Signed)
Autumn Haley 07/27/1966 [id number]   History:    48 y.o.  for annual gyn exam was and dysuria today. Patient has been followed by urologist Dr. Ishmael Holter because of her recurrent urinary tract infections. She had been placed on cephalexin for 2 months but she stopped approximately 10 days ago and is having some dysuria and some frequency but no fever, chills, nausea vomiting or back pain. Patient has been using Depo-Provera 150 mg IM every 3 months for contraception. Patient with no prior history of abnormal Pap smears. Patient with history of fibroid uterus as noted on ultrasound May 2013. Patient was complaining of some tiredness and fatigue today. She states all her vaccinations are up-to-date.  Past medical history,surgical history, family history and social history were all reviewed and documented in the EPIC chart.  Gynecologic History No LMP recorded. Patient has had an injection. Contraception: Depo-Provera injections Last Pap: 2012. Results were: normal Last mammogram: 2015. Results were: normal  Obstetric History OB History  Gravida Para Term Preterm AB SAB TAB Ectopic Multiple Living  3 2 2  1 1    2     # Outcome Date GA Lbr Len/2nd Weight Sex Delivery Anes PTL Lv  3 SAB           2 TRM     F SVD  N Y  1 TRM     M SVD  N Y       ROS: A ROS was performed and pertinent positives and negatives are included in the history.  GENERAL: No fevers or chills. HEENT: No change in vision, no earache, sore throat or sinus congestion. NECK: No pain or stiffness. CARDIOVASCULAR: No chest pain or pressure. No palpitations. PULMONARY: No shortness of breath, cough or wheeze. GASTROINTESTINAL: No abdominal pain, nausea, vomiting or diarrhea, melena or bright red blood per rectum. GENITOURINARY: No urinary frequency, urgency, hesitancy or dysuria. MUSCULOSKELETAL: No joint or muscle pain, no back pain, no recent trauma. DERMATOLOGIC: No rash, no itching, no lesions. ENDOCRINE: No  polyuria, polydipsia, no heat or cold intolerance. No recent change in weight. HEMATOLOGICAL: No anemia or easy bruising or bleeding. NEUROLOGIC: No headache, seizures, numbness, tingling or weakness. PSYCHIATRIC: No depression, no loss of interest in normal activity or change in sleep pattern.     Exam: chaperone present  BP 130/86  Ht 5' (1.524 m)  Wt 134 lb (60.782 kg)  BMI 26.17 kg/m2  Body mass index is 26.17 kg/(m^2).  General appearance : Well developed well nourished female. No acute distress HEENT: Neck supple, trachea midline, no carotid bruits, no thyroidmegaly Lungs: Clear to auscultation, no rhonchi or wheezes, or rib retractions  Heart: Regular rate and rhythm, no murmurs or gallops Breast:Examined in sitting and supine position were symmetrical in appearance, no palpable masses or tenderness,  no skin retraction, no nipple inversion, no nipple discharge, no skin discoloration, no axillary or supraclavicular lymphadenopathy Abdomen: no palpable masses or tenderness, no rebound or guarding Extremities: no edema or skin discoloration or tenderness  Pelvic:  Bartholin, Urethra, Skene Glands: Within normal limits             Vagina: No gross lesions or discharge  Cervix: No gross lesions or discharge  Uterus  anteverted, normal size, shape and consistency, non-tender and mobile  Adnexa  Without masses or tenderness  Anus and perineum  normal   Rectovaginal  normal sphincter tone without palpated masses or tenderness  Hemoccult not indicated   Urinalysis: Many bacteria, too numerous to count white blood cell, 3-6 RBC urine submitted for culture  Assessment/Plan:  48 y.o. female for annual exam with clinical evidence of urinary tract infection. Review of patient's urine culture and sensitivity last year demonstrated that microorganisms isolated with Escherichia coli and was sensitive to Macrobid. She will be placed on Macrobid 100 mg twice a day for 7 day period  have asked her to followup with the urologist as had previously been recommended by him for her followup on her recurrent urinary tract infection. She states that they have done extensive evaluation to include CT scan for which we do not have the report. She was reminded to do her monthly breast exam. Her Pap smear was done today in accordance to the new guidelines. The following labs were ordered today: CBC, TSH, fasting lipid profile, comprehensive metabolic panel, urinalysis and vitamin D.  New CDC guidelines is recommending patients be tested once in her lifetime for hepatitis C antibody who were born between [birth year] through 1963. This was discussed with the patient today and has agreed to be tested today.  Note: This dictation was prepared with  Dragon/digital dictation along withSmart phrase technology. Any transcriptional errors that result from this process are unintentional.   Terrance Mass MD, 12:21 PM 11/03/2013

## 2013-11-04 ENCOUNTER — Other Ambulatory Visit: Payer: Self-pay | Admitting: Gynecology

## 2013-11-04 DIAGNOSIS — E559 Vitamin D deficiency, unspecified: Secondary | ICD-10-CM

## 2013-11-04 LAB — TSH: TSH: 1.133 u[IU]/mL (ref 0.350–4.500)

## 2013-11-04 LAB — VITAMIN D 25 HYDROXY (VIT D DEFICIENCY, FRACTURES): Vit D, 25-Hydroxy: 22 ng/mL — ABNORMAL LOW (ref 30–89)

## 2013-11-04 LAB — HEPATITIS C ANTIBODY: HCV AB: NEGATIVE

## 2013-11-04 MED ORDER — VITAMIN D (ERGOCALCIFEROL) 1.25 MG (50000 UNIT) PO CAPS: 50000.0000 [IU] | ORAL_CAPSULE | ORAL | Status: DC

## 2013-11-05 LAB — URINE CULTURE: Colony Count: >=100000

## 2013-11-16 ENCOUNTER — Encounter: Payer: Self-pay | Admitting: Gynecology

## 2013-12-26 ENCOUNTER — Telehealth: Payer: Self-pay | Admitting: *Deleted

## 2013-12-26 DIAGNOSIS — Z3042 Encounter for surveillance of injectable contraceptive: Secondary | ICD-10-CM

## 2013-12-26 MED ORDER — MEDROXYPROGESTERONE ACETATE 150 MG/ML IM SUSP: 150.0000 mg | INTRAMUSCULAR | Status: DC

## 2013-12-26 NOTE — Telephone Encounter (Signed)
Pt called requesting refill on depo provera, rx sent, annual up to date.

## 2014-01-04 ENCOUNTER — Ambulatory Visit: Payer: BC Managed Care – PPO | Admitting: Gynecology

## 2014-01-13 ENCOUNTER — Ambulatory Visit (INDEPENDENT_AMBULATORY_CARE_PROVIDER_SITE_OTHER): Payer: BC Managed Care – PPO | Admitting: Anesthesiology

## 2014-01-13 DIAGNOSIS — N912 Amenorrhea, unspecified: Secondary | ICD-10-CM

## 2014-01-13 DIAGNOSIS — N949 Unspecified condition associated with female genital organs and menstrual cycle: Secondary | ICD-10-CM

## 2014-01-13 DIAGNOSIS — N925 Other specified irregular menstruation: Secondary | ICD-10-CM

## 2014-01-13 DIAGNOSIS — N938 Other specified abnormal uterine and vaginal bleeding: Secondary | ICD-10-CM

## 2014-01-13 LAB — PREGNANCY, URINE: Preg Test, Ur: NEGATIVE

## 2014-01-13 MED ORDER — MEDROXYPROGESTERONE ACETATE 150 MG/ML IM SUSP
150.0000 mg | Freq: Once | INTRAMUSCULAR | Status: AC
  Administered 2014-01-13: 150 mg via INTRAMUSCULAR

## 2014-01-13 NOTE — Progress Notes (Signed)
Patient came in today for depo provera shot - she was 7 days late for it. A pregnancy test was done - it was negative. Per Elon Alas it was ok to give depo provera to patient. Patient advised next depo provera shot due between August 7th and August 21st..Marland Kitchen

## 2014-02-09 ENCOUNTER — Other Ambulatory Visit: Payer: BC Managed Care – PPO

## 2014-02-09 DIAGNOSIS — E559 Vitamin D deficiency, unspecified: Secondary | ICD-10-CM

## 2014-02-13 LAB — VITAMIN D 1,25 DIHYDROXY
VITAMIN D2 1, 25 (OH): 52 pg/mL
VITAMIN D3 1, 25 (OH): 20 pg/mL
Vitamin D 1, 25 (OH)2 Total: 72 pg/mL (ref 18–72)

## 2014-04-07 ENCOUNTER — Ambulatory Visit: Payer: BC Managed Care – PPO

## 2014-04-21 ENCOUNTER — Ambulatory Visit (INDEPENDENT_AMBULATORY_CARE_PROVIDER_SITE_OTHER): Payer: BC Managed Care – PPO | Admitting: Anesthesiology

## 2014-04-21 DIAGNOSIS — Z3042 Encounter for surveillance of injectable contraceptive: Secondary | ICD-10-CM

## 2014-04-21 DIAGNOSIS — N912 Amenorrhea, unspecified: Secondary | ICD-10-CM

## 2014-04-21 DIAGNOSIS — Z3049 Encounter for surveillance of other contraceptives: Secondary | ICD-10-CM

## 2014-04-21 LAB — PREGNANCY, URINE: Preg Test, Ur: NEGATIVE

## 2014-04-21 MED ORDER — MEDROXYPROGESTERONE ACETATE 150 MG/ML IM SUSP
150.0000 mg | Freq: Once | INTRAMUSCULAR | Status: AC
  Administered 2014-04-21: 150 mg via INTRAMUSCULAR

## 2014-06-26 ENCOUNTER — Encounter: Payer: Self-pay | Admitting: Gynecology

## 2014-07-13 ENCOUNTER — Ambulatory Visit (INDEPENDENT_AMBULATORY_CARE_PROVIDER_SITE_OTHER): Payer: BC Managed Care – PPO | Admitting: Anesthesiology

## 2014-07-13 DIAGNOSIS — Z3042 Encounter for surveillance of injectable contraceptive: Secondary | ICD-10-CM

## 2014-07-13 MED ORDER — MEDROXYPROGESTERONE ACETATE 150 MG/ML IM SUSP
150.0000 mg | Freq: Once | INTRAMUSCULAR | Status: AC
  Administered 2014-07-13: 150 mg via INTRAMUSCULAR

## 2014-07-31 IMAGING — CR DG KNEE 1-2V*R*
2 series · 2 of 2 positions shown · non-contrast
Comparison: None.

CLINICAL DATA: Knee pain.

RIGHT KNEE - 1-2 VIEW

[AP]
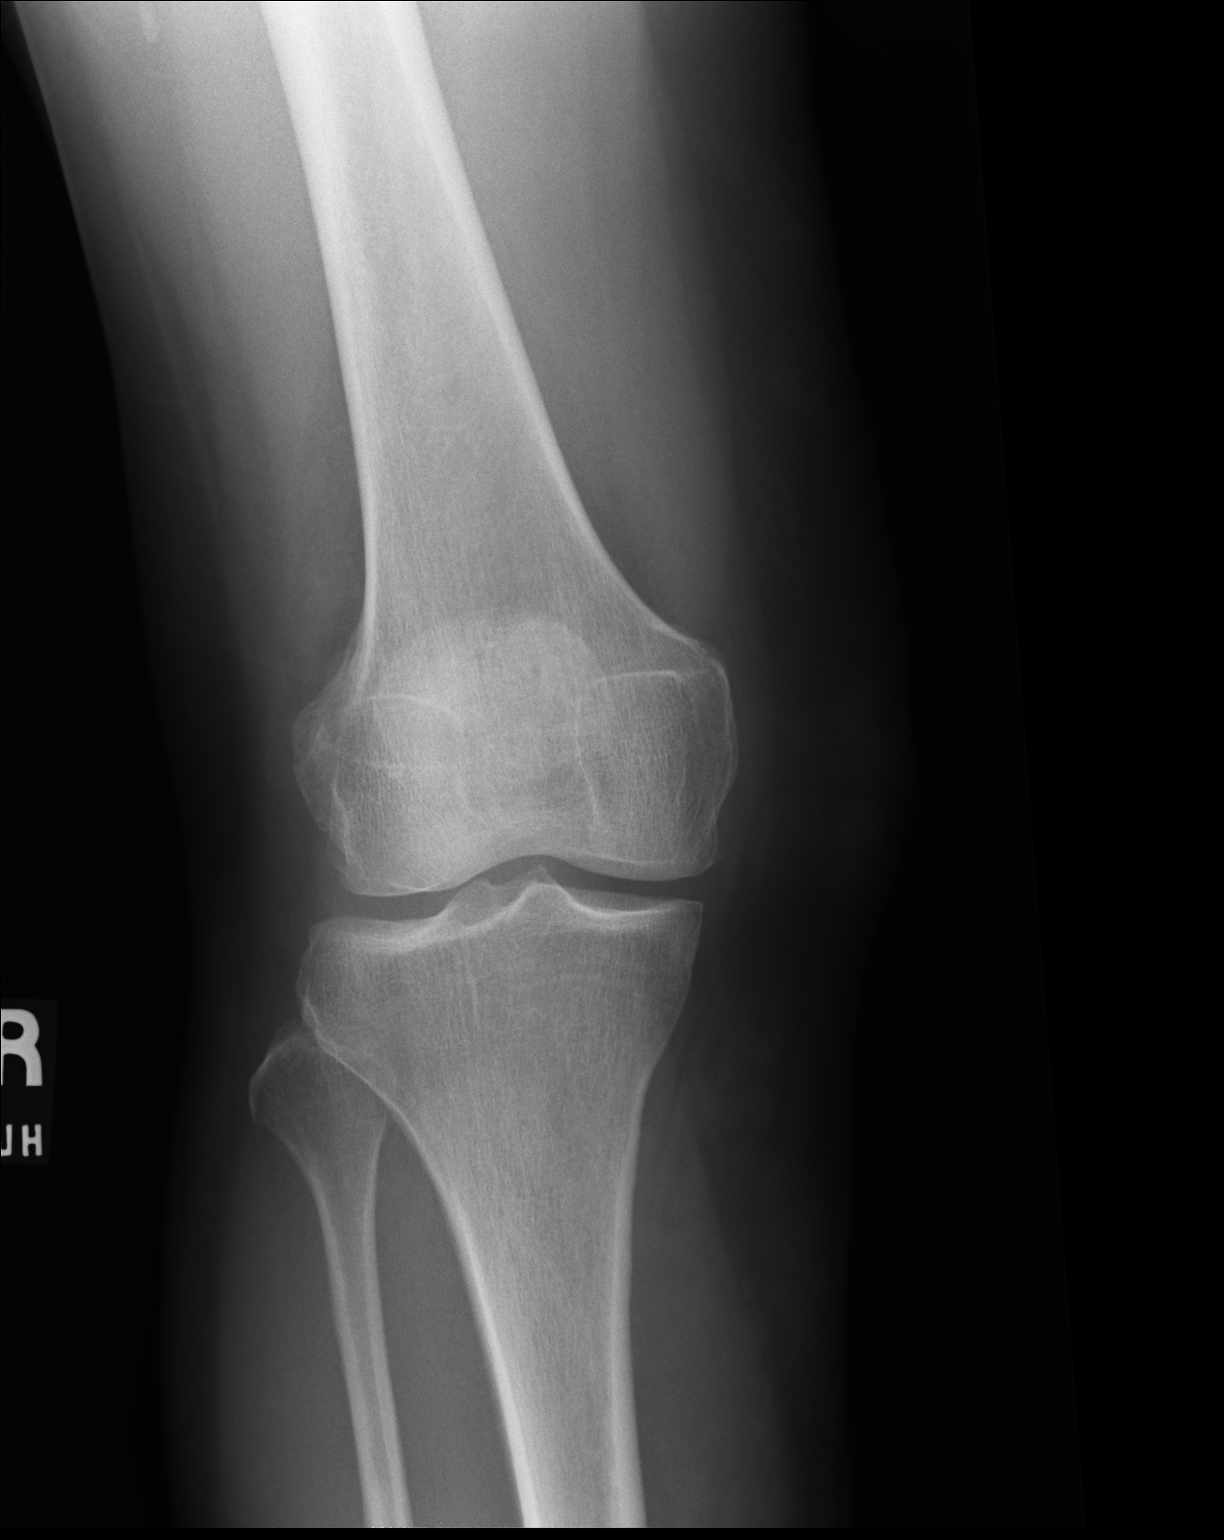

[lateral]
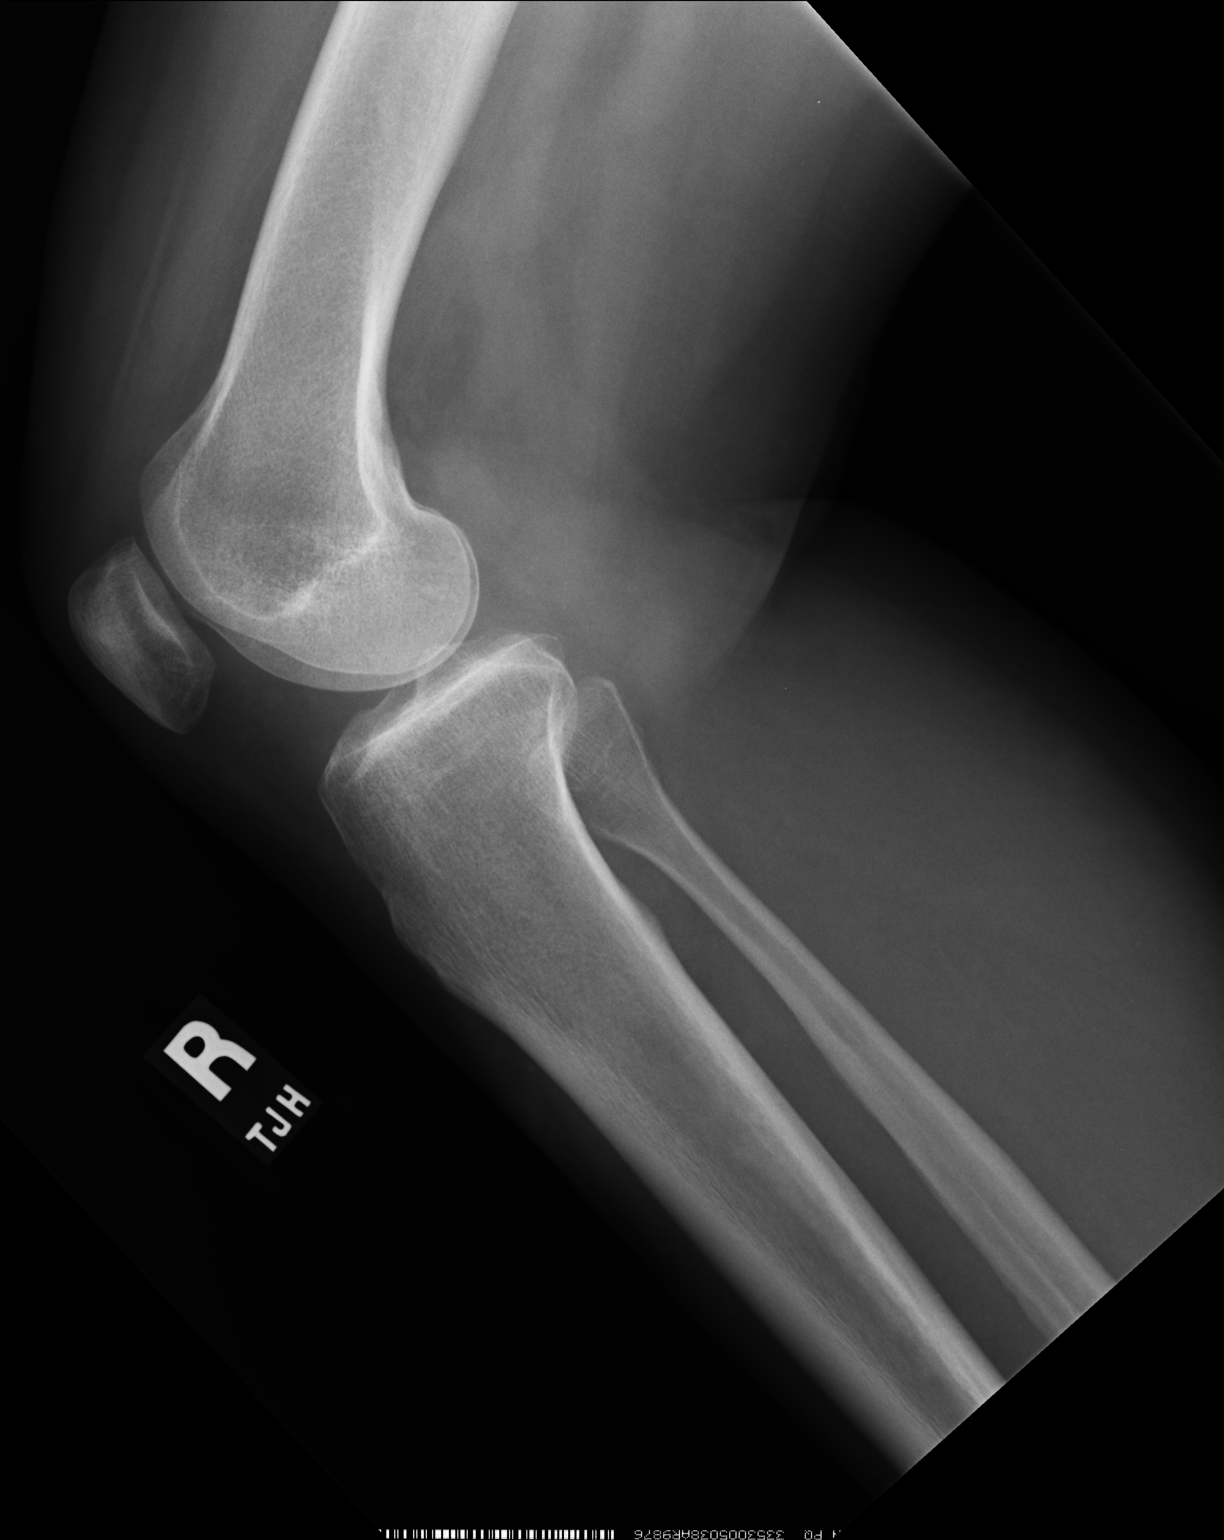

[2 of 2 positions shown; findings below may reference images not displayed]

FINDINGS: No joint effusion or fracture.  No significant
degenerative changes.
IMPRESSION: Negative.

## 2014-09-19 ENCOUNTER — Other Ambulatory Visit: Payer: Self-pay | Admitting: Urgent Care

## 2014-09-19 ENCOUNTER — Ambulatory Visit (INDEPENDENT_AMBULATORY_CARE_PROVIDER_SITE_OTHER): Payer: BLUE CROSS/BLUE SHIELD

## 2014-09-19 ENCOUNTER — Ambulatory Visit (INDEPENDENT_AMBULATORY_CARE_PROVIDER_SITE_OTHER): Payer: BLUE CROSS/BLUE SHIELD | Admitting: Family Medicine

## 2014-09-19 VITALS — BP 136/82 | HR 48 | Temp 98.3°F | Resp 18 | Wt 142.0 lb

## 2014-09-19 DIAGNOSIS — M25571 Pain in right ankle and joints of right foot: Secondary | ICD-10-CM

## 2014-09-19 DIAGNOSIS — M7989 Other specified soft tissue disorders: Secondary | ICD-10-CM

## 2014-09-19 DIAGNOSIS — M25572 Pain in left ankle and joints of left foot: Secondary | ICD-10-CM

## 2014-09-19 MED ORDER — DICLOFENAC SODIUM 1 % TD CREA: 1.0000 "application " | TOPICAL_CREAM | Freq: Two times a day (BID) | TRANSDERMAL | Status: DC

## 2014-09-19 NOTE — Patient Instructions (Signed)
Neuroma de Morton en los deportes (Neuroma plantar interdigital) (Morton's Neuroma in Sports, Interdigital Plantar Neuroma) El neuroma de Morton es un trastorno en el sistema nervioso que causa dolor y prdida de la sensibilidad entre los dedos del pie. Est causado por una compresin de los nervios por los huesos del pie de dos dedos (nervio interdigital). Los dedos que ms suelen estar afectados por esta enfermedad son el tercero y el cuarto. SNTOMAS  Hormigueo, adormecimiento, quemazn o shock elctrico en el antepie, generalmente entre el tercero y el cuarto dedo, aunque puede involucrar otros pares de dedos.  Dolor y sensibilidad en la zona anterior del pie, que empeora al caminar.  Dolor que empeora cuando se ejerce presin en el pie (calzado).  Dolor intenso en la zona anterior del pie al pararse en puntas de pie, como al correr, saltar, pivotar o bailar. CAUSAS El neuroma de Morton est causado por una inflamacin en el [employer] dedos. La hinchazn le produce al nervio un pinzamiento [employer]. EL RIESGO AUMENTA CON:  Lesiones recurrentes en el pie o el tobillo.  Zapatos que no ajustan bien o muy usados que ya no tienen absorcin [employer].  Ligamentos flojos en el pie, que hace que el nervio se engruese.  Poca fuerza y flexibilidad. PREVENCIN  Precalentamiento adecuado y elongacin antes de la [place].  Mantener la forma fsica:  La flexibilidad en el tobillo y la pierna  La fuerza y la resistencia muscular.  Capacidad cardiovascular.  Use un calzado protector adecuado y [employer].  Utilice soportes de arco (rtesis), cuando sea necesario. PRONSTICO Si se trata de forma adecuada, el neuroma de Eden Lathe desaparece sin [place]. En ciertos casos, podra ser necesario someterse a [place]. POSIBLES COMPLICACIONES  Dolor y adormecimiento persistente del pie.  Inhabilidad e incapacidad para competir, debido al  [employer]. TRATAMIENTO El tratamiento inicial incluye interrumpir las actividades que agravan los sntomas. El tratamiento inicial tambin incluye el uso de medicamentos y la aplicacin de hielo para reducir Conservation officer, historic buildings y la inflamacin. Utilizar zapatos con Ben[place] Core, y un soporte de arco o barra metatarsiana, tambin podran ayudar a reducir Conservation officer, historic buildings. En algunos casos se indica una inyeccin de corticoides para reducir la inflamacin. Si no se obtiene xito con Music therapist, ser necesario someterse a [place]. Este tipo de [place] se Marine scientist, lo que significa que podr [employer] a su casa el mismo da de la [place]. El procedimiento implica eliminar la fuente de presin en el nervio. De ser necesario eliminar el nervio, cabe esperar que se produzca un adormecimiento persistente. MEDICAMENTOS  Si es necesaria la administracin de medicamentos para Conservation officer, historic buildings, se recomiendan los antiinflamatorios no esteroides, como aspirina e ibuprofeno y otros calmantes menores, como acetaminofeno.  No tome medicamentos para el dolor dentro de los 7 das previos a la [place].  En general, slo se prescriben calmantes despus de la ciruga . Utilcelos como se le indique y slo cuando lo necesite.  En algunos casos graves se indica una inyeccin de corticoides para reducir la inflamacin. Deben administrarse cuando sea necesario, porque slo puede darse un nmero limitado de inyecciones. CALOR Y FRO  El fro (con hielo) debe aplicarse durante 10 a 15 minutos cada 2  3 horas para reducir la inflamacin y Conservation officer, historic buildings e inmediatamente despus de cualquier actividad que agrava los sntomas. Utilice bolsas o un masaje de hielo.  El calor puede usarse antes de Neurosurgeon y  de las actividades de fortalecimiento indicadas por Writer, le fisioterapeuta o Industrial/product designer. Utilice una bolsa trmica o un pao hmedo. SOLICITE ATENCIN MDICA SI:  Los sntomas empeoran o no mejoran en 2  semanas, a pesar de Chiropodist.  Luego de la ciruga presenta aumento del dolor, enrojecimiento, hinchazn, aumento del calor, supuracin, hemorragia o fiebre.  Desarrolla nuevos e inexplicables sntomas. (Los medicamentos indicados en el tratamiento le ocasionan efectos secundarios). Document Released: 05/28/2006 Document Revised: 11/03/2011 [facility] Patient Information 2015 [place]. This information is not intended to replace advice given to you by your health care provider. Make sure you discuss any questions you have with your health care provider.

## 2014-09-19 NOTE — Progress Notes (Signed)
    MRN: [id number] DOB: 04-19-1966  Subjective:   Autumn Haley is a 49 y.o. female presenting for 2 week history of worsening new onset left foot pain. Patient cannot recall any trauma or injury. States that her left foot started hurting at the end of an otherwise normal work-day. Has started to experience some swelling in her foot in the last week, worse at night/end of work-day, now also feeling some pain in the morning, pain aggravated by touch and planting her left foot. Has been using a Bengay type otc cream with minimal relief. Denies decreased ROM, changes in sensation, joint swelling, erythema, ecchymosis, fevers, wounds, previous surgeries or foot issues. 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 Stands, walks for the majority of the time at work, wears shoes with good support, no new shoes recently. Denies smoking or alcohol use. Denies any other aggravating or relieving factors, no other questions or concerns.  Autumn Haley has a current medication list which includes the following prescription(s): calcium carbonate-vit d-min and medroxyprogesterone.  Autumn Haley is allergic to levaquin.  Autumn Haley  has a past medical history of NSVD (normal spontaneous vaginal delivery); SAB (spontaneous abortion); Fibroid; NSVD (normal spontaneous vaginal delivery); SAB (spontaneous abortion); Chronic kidney disease; and Arthritis. Also  has no past surgical history on file.  ROS As in subjective.  Objective:   Vitals: BP 136/82 mmHg  Pulse 48  Temp(Src) 98.3 F (36.8 C) (Oral)  Resp 18  Wt 142 lb (64.411 kg)  SpO2 99%  Physical Exam  Constitutional: Autumn Haley is oriented to person, place, and time and well-developed, well-nourished, and in no distress.  Pulmonary/Chest: Effort normal.  Musculoskeletal:  Mild-moderate swelling mid left foot. No erythema, ecchymosis, crepitus, bony deformity. Full ROM (extension, flexion, eversion and  inversion), sensation intact, strength 5/5.  Neurological: Autumn Haley is alert and oriented to person, place, and time.  Skin: Skin is warm and dry. No rash noted. No erythema.  Psychiatric: Mood and affect normal.   [facility] reading (PRIMARY) by  Dr. Carlota Raspberry and PA-Ibeth Fahmy. Left foot x-ray: Bony landmarks within normal limits, normal alignment without evidence of fracture or dislocation. Preserved joint spaces. Soft tissue unremarkable.  Assessment and Plan :   1. Pain in joint, ankle and foot, left 2. Foot swelling - Suspect Morton's neuroma, advised rest, wear a post-op shoe or wide-box shoe - For pain, apply topical diclofenac BID - If no improvement or worsening symptoms within 2 weeks, please return to clinic for re-evaluation  [name], PA-C [facility] [phone] 09/19/2014 2:07 PM

## 2014-09-20 ENCOUNTER — Telehealth: Payer: Self-pay

## 2014-09-20 NOTE — Progress Notes (Signed)
Xray read and patient discussed and examined with Mr. Bess Harvest. Agree with assessment and plan of care per his note.

## 2014-09-20 NOTE — Telephone Encounter (Signed)
Pharmacy called wanting to see if we could change his Voltaren cream to gel. Per Chelle this is ok and warned it may require a prior authorization.

## 2014-09-21 ENCOUNTER — Telehealth: Payer: Self-pay

## 2014-09-21 NOTE — Telephone Encounter (Signed)
PA started for Voltaren Gel 1%

## 2014-10-30 ENCOUNTER — Other Ambulatory Visit: Payer: Self-pay

## 2014-10-30 DIAGNOSIS — Z1231 Encounter for screening mammogram for malignant neoplasm of breast: Secondary | ICD-10-CM

## 2014-11-06 ENCOUNTER — Encounter: Payer: Self-pay | Admitting: Gynecology

## 2014-11-06 ENCOUNTER — Ambulatory Visit (INDEPENDENT_AMBULATORY_CARE_PROVIDER_SITE_OTHER): Payer: BLUE CROSS/BLUE SHIELD | Admitting: Gynecology

## 2014-11-06 VITALS — BP 124/78 | Ht 60.0 in | Wt 137.0 lb

## 2014-11-06 DIAGNOSIS — Z8639 Personal history of other endocrine, nutritional and metabolic disease: Secondary | ICD-10-CM

## 2014-11-06 DIAGNOSIS — Z01419 Encounter for gynecological examination (general) (routine) without abnormal findings: Secondary | ICD-10-CM

## 2014-11-06 DIAGNOSIS — N951 Menopausal and female climacteric states: Secondary | ICD-10-CM | POA: Diagnosis not present

## 2014-11-06 DIAGNOSIS — R232 Flushing: Secondary | ICD-10-CM

## 2014-11-06 DIAGNOSIS — N912 Amenorrhea, unspecified: Secondary | ICD-10-CM | POA: Diagnosis not present

## 2014-11-06 DIAGNOSIS — Z3049 Encounter for surveillance of other contraceptives: Secondary | ICD-10-CM | POA: Diagnosis not present

## 2014-11-06 DIAGNOSIS — D251 Intramural leiomyoma of uterus: Secondary | ICD-10-CM | POA: Diagnosis not present

## 2014-11-06 LAB — COMPREHENSIVE METABOLIC PANEL
ALK PHOS: 58 U/L (ref 39–117)
ALT: 15 U/L (ref 0–35)
AST: 16 U/L (ref 0–37)
Albumin: 3.8 g/dL (ref 3.5–5.2)
BILIRUBIN TOTAL: 0.4 mg/dL (ref 0.2–1.2)
BUN: 14 mg/dL (ref 6–23)
CO2: 24 mEq/L (ref 19–32)
Calcium: 8.5 mg/dL (ref 8.4–10.5)
Chloride: 107 mEq/L (ref 96–112)
Creat: 0.63 mg/dL (ref 0.50–1.10)
GLUCOSE: 101 mg/dL — AB (ref 70–99)
Potassium: 3.9 mEq/L (ref 3.5–5.3)
Sodium: 140 mEq/L (ref 135–145)
Total Protein: 6.5 g/dL (ref 6.0–8.3)

## 2014-11-06 LAB — CBC WITH DIFFERENTIAL/PLATELET
Basophils Absolute: 0.1 10*3/uL (ref 0.0–0.1)
Basophils Relative: 1 % (ref 0–1)
EOS ABS: 0.2 10*3/uL (ref 0.0–0.7)
Eosinophils Relative: 2 % (ref 0–5)
HCT: 41.3 % (ref 36.0–46.0)
Hemoglobin: 13.7 g/dL (ref 12.0–15.0)
LYMPHS ABS: 2.5 10*3/uL (ref 0.7–4.0)
LYMPHS PCT: 33 % (ref 12–46)
MCH: 29.7 pg (ref 26.0–34.0)
MCHC: 33.2 g/dL (ref 30.0–36.0)
MCV: 89.4 fL (ref 78.0–100.0)
MPV: 9.3 fL (ref 8.6–12.4)
Monocytes Absolute: 0.8 10*3/uL (ref 0.1–1.0)
Monocytes Relative: 10 % (ref 3–12)
NEUTROS ABS: 4.1 10*3/uL (ref 1.7–7.7)
Neutrophils Relative %: 54 % (ref 43–77)
Platelets: 277 10*3/uL (ref 150–400)
RBC: 4.62 MIL/uL (ref 3.87–5.11)
RDW: 14 % (ref 11.5–15.5)
WBC: 7.5 10*3/uL (ref 4.0–10.5)

## 2014-11-06 LAB — TSH: TSH: 1.455 u[IU]/mL (ref 0.350–4.500)

## 2014-11-06 LAB — LIPID PANEL
CHOL/HDL RATIO: 3.4 ratio
CHOLESTEROL: 172 mg/dL (ref 0–200)
HDL: 50 mg/dL (ref >=46)
LDL Cholesterol: 112 mg/dL — ABNORMAL HIGH (ref 0–99)
Triglycerides: 51 mg/dL (ref <150)
VLDL: 10 mg/dL (ref 0–40)

## 2014-11-06 LAB — PREGNANCY, URINE: Preg Test, Ur: NEGATIVE

## 2014-11-06 MED ORDER — MEDROXYPROGESTERONE ACETATE 150 MG/ML IM SUSP
150.0000 mg | Freq: Once | INTRAMUSCULAR | Status: AC
  Administered 2014-11-06: 150 mg via INTRAMUSCULAR

## 2014-11-06 NOTE — Addendum Note (Signed)
Addended by: Thurnell Garbe A on: 11/06/2014 10:11 AM   Modules accepted: Orders, SmartSet

## 2014-11-06 NOTE — Progress Notes (Signed)
Autumn Haley February 05, 1966 [id number]   History:    49 y.o.  for annual gyn exam with complaints of night sweats and thinning of her hair. She has been on the Depo-Provera injection for almost 3 years and her last injection was in November and she did not come in February and wanted to have her injection today and look at other contraceptive options. Patient last year was treated for vitamin D deficiency and is currently taking vitamin D3 thousand units daily. Patient with no past history of any abnormal Pap smears. Patient does have history of fibroid uterus.  Past medical history,surgical history, family history and social history were all reviewed and documented in the EPIC chart.  Gynecologic History No LMP recorded. Patient has had an injection. Contraception: Depo-Provera injections Last Pap: 2015. Results were: normal Last mammogram: 2015. Results were: normal  Obstetric History OB History  Gravida Para Term Preterm AB SAB TAB Ectopic Multiple Living  3 2 2  1 1    2     # Outcome Date GA Lbr Len/2nd Weight Sex Delivery Anes PTL Lv  3 SAB           2 Term     F Vag-Spont  N Y  1 Term     M Vag-Spont  N Y       ROS: A ROS was performed and pertinent positives and negatives are included in the history.  GENERAL: No fevers or chills. HEENT: No change in vision, no earache, sore throat or sinus congestion. NECK: No pain or stiffness. CARDIOVASCULAR: No chest pain or pressure. No palpitations. PULMONARY: No shortness of breath, cough or wheeze. GASTROINTESTINAL: No abdominal pain, nausea, vomiting or diarrhea, melena or bright red blood per rectum. GENITOURINARY: No urinary frequency, urgency, hesitancy or dysuria. MUSCULOSKELETAL: No joint or muscle pain, no back pain, no recent trauma. DERMATOLOGIC: No rash, no itching, no lesions. ENDOCRINE: No polyuria, polydipsia, no heat or cold intolerance. No recent change in weight. HEMATOLOGICAL: No anemia or easy bruising or bleeding.  NEUROLOGIC: No headache, seizures, numbness, tingling or weakness. PSYCHIATRIC: No depression, no loss of interest in normal activity or change in sleep pattern.     Exam: chaperone present  BP 124/78 mmHg  Ht 5' (1.524 m)  Wt 137 lb (62.143 kg)  BMI 26.76 kg/m2  Body mass index is 26.76 kg/(m^2).  General appearance : Well developed well nourished female. No acute distress HEENT: Eyes: no retinal hemorrhage or exudates,  Neck supple, trachea midline, no carotid bruits, no thyroidmegaly Lungs: Clear to auscultation, no rhonchi or wheezes, or rib retractions  Heart: Regular rate and rhythm, no murmurs or gallops Breast:Examined in sitting and supine position were symmetrical in appearance, no palpable masses or tenderness,  no skin retraction, no nipple inversion, no nipple discharge, no skin discoloration, no axillary or supraclavicular lymphadenopathy Abdomen: no palpable masses or tenderness, no rebound or guarding Extremities: no edema or skin discoloration or tenderness  Pelvic:  Bartholin, Urethra, Skene Glands: Within normal limits             Vagina: No gross lesions or discharge  Cervix: No gross lesions or discharge  Uterus  10 week size irregular, nontender  Adnexa  Without masses or tenderness  Anus and perineum  normal   Rectovaginal  normal sphincter tone without palpated masses or tenderness             Hemoccult not indicated   Urine pregnancy test negative  Assessment/Plan:  48  y.o. female for annual exam who is perimenopausal still on Depo-Provera injection. It was 4 months since her last injection. Because of her vasomotor symptoms we will go ahead and check an [facility] today. Since her urine pregnancy test was negative she received the Depo-Provera injection today and was provided with literature information on the Nexplanon as well as the Mirena IUD in case in 3 months she would like to proceed this alternative form of contraception until she reaches the menopause. She  was reminded to do her monthly breast examination. Because of her history vitamin D deficiency will be checking a vitamin D level today. Other screening labs to be done today as follows: CBC, fasting lipid profile, comprehensive metabolic panel, TSH, and urinalysis. She will return back to the office in 1-2 weeks for an ultrasound to compare with previous study of several years ago. She was reminded to schedule her mammogram and to request for a three-dimensional D her history of dense breasts.   Terrance Mass MD, 10:04 AM 11/06/2014

## 2014-11-06 NOTE — Patient Instructions (Signed)
Levonorgestrel intrauterine device (IUD) Qu es este medicamento? El LEVONORGESTREL (DIU) es un dispositivo anticonceptivo (control de natalidad). El dispositivo se coloca dentro del tero por un profesional de la salud. Se utiliza para Therapist, occupational y tambin se puede Risk manager para tratar el sangrado abundante que ocurre durante su perodo. Dependiendo del dispositivo, se puede utilizar por 3 a 5 aos. Este medicamento puede ser utilizado para otros usos; si tiene alguna pregunta consulte con su proveedor de atencin mdica o con su farmacutico. MARCAS COMERCIALES DISPONIBLES: Jackson Latino, Hubbard Hartshorn le debo informar a mi profesional de la salud antes de tomar este medicamento? Necesita saber si usted presenta alguno de los siguientes problemas o situaciones: -exmen de Papanicolaou anormal -cncer de mama, cuello del tero o tero -diabetes -endometritis -si tiene una infeccin plvica o genital actual o en el pasado -tiene ms de una pareja sexual o si su pareja tiene ms de una pareja -enfermedad cardiaca -antecedente de embarazo tubrico o ectpico -problemas del sistema inmunolgico -DIU colocado -enfermedad heptica o tumor del hgado -problemas con la coagulacin o si toma diluyentes sanguneos -[place] medicamentos intravenoso -forma inusual del tero -sangrado vaginal que no tiene explicacin -una reaccin alrgica o inusual al levonorgestrel, a otras hormonas, a la silicona o polietilenos, a otros medicamentos, alimentos, colorantes o conservantes -si est embarazada o buscando quedar embarazada -si est amamantando a un beb Cmo debo utilizar este medicamento? Un profesional de Estate agent este dispositivo en el tero. Hable con su pediatra para informarse acerca del uso de este medicamento en nios. Puede requerir atencin especial. Sobredosis: Pngase en contacto inmediatamente con un centro toxicolgico o una sala de urgencia si usted cree que haya tomado  demasiado medicamento. ATENCIN: [employer] es solo para usted. No comparta este medicamento con nadie. Qu sucede si me olvido de una dosis? No se aplica en este caso. Qu puede interactuar con este medicamento? No tome esta medicina con ninguno de los siguientes medicamentos: -amprenavir -bosentano -fosamprenavir Esta medicina tambin puede interactuar con los siguientes medicamentos: -aprepitant -barbitricos para producir el sueo o para el tratamiento de convulsiones -bexaroteno -griseofulvina -medicamentos para tratar los convulsiones, tales como [place], [place], [place], [place], [place], topiramato -modafinilo -pioglitazona -rifabutina -rifampicina -rifapentina -algunos medicamentos para tratar el virus VIH, tales como atazanavir, indinavir, lopinavir, nelfinavir, tipranavir, ritonavir -hierba de [place] -warfarina Puede ser que esta lista no menciona todas las posibles interacciones. Informe a su profesional de [employer]	[place] de [employer] productos a base de hierbas, medicamentos de [place] o suplementos nutritivos que est tomando. Si usted fuma, consume bebidas alcohlicas o si utiliza drogas ilegales, indqueselo tambin a su profesional de [employer]	[place]. Algunas sustancias pueden interactuar con su medicamento. A qu debo estar atento al usar [employer]? Visite a su mdico o a su profesional de la salud para chequear su evolucin peridicamente. Visite a su mdico si usted o su pareja tiene relaciones sexuales con [employer], se vuelve VIH positivo o contrae una enfermedad de transmisin sexual. Este medicamento no la protege de la infeccin por VIH (SIDA) ni de ninguna otra enfermedad de transmisin sexual. Puede controlar la ubicacin del DIU usted misma palpando con sus dedos limpios los hilos en la parte anterior de la vagina. No tire de los hilos. Es un buen hbito controlar la ubicacin del dispositivo despus de cada perodo menstrual. Si  no slo siente los hilos sino que adems siente otra parte ms del DIU o si no puede sentir los hilos, consulte a Visual merchandiser  mdico inmediatamente. El DIU puede salirse por s solo. Puede quedar embarazada si el dispositivo se sale de Chief of Staff. Utilice un mtodo anticonceptivo adicional, como preservativos, y consulte a su proveedor de atencin mdica s observa que el DIU se sali de Chief of Staff. La utilizacin de tampones no cambia la posicin del DIU y no hay inconvenientes en usarlos durante su perodo. Qu efectos secundarios puedo tener al [employer] este medicamento? Efectos secundarios que debe informar a su mdico o a Barrister's clerk de la salud tan pronto como sea posible: -Chief of Staff como erupcin cutnea, picazn o urticarias, hinchazn de la cara, labios o lengua -fiebre, sntomas gripales -llagas genitales -alta presin sangunea -ausencia de un perodo menstrual durante 6 semanas mientras lo utiliza -Social research officer, government, Occupational hygienist en las piernas -dolor o sensibilidad del plvico -dolor de cabeza repentino o severo -signos de Media planner -calambres estomacales -falta de aliento repentina -problemas de coordinacin, del habla, al caminar -sangrado, flujo vaginal inusual -color amarillento de los ojos o la piel Efectos secundarios que, por lo general, no requieren atencin mdica (debe informarlos a su mdico o a su profesional de la salud si persisten o si son molestos): -acn -dolor de pecho -cambios en el deseo sexual o capacidad -cambios de peso -calambres, Tree surgeon o sensacin de [employer] se introduce el dispositivo -dolor de cabeza -sangrado menstruales irregulares en los primeros 3 a 6 meses de usar -nuseas Puede ser que esta lista no menciona todos los posibles efectos secundarios. Comunquese a su mdico por asesoramiento mdico [employer]. Usted puede informar los efectos secundarios a la FDA por telfono al 1-800-FDA-1088. Dnde debo guardar mi  medicina? No se aplica en este caso. ATENCIN: Este folleto es un resumen. Puede ser que no cubra toda la posible informacin. Si usted tiene preguntas acerca de esta medicina, consulte con su mdico, su farmacutico o su profesional de Technical sales engineer.  2015, Elsevier/Gold Standard. (2011-09-30 16:57:41) Etonogestrel implant Qu es este medicamento? El ETONOGESTREL es un dispositivo anticonceptivo (control de la natalidad). Se utiliza para Patent attorney. Se puede utilizar hasta 3 aos. Este medicamento puede ser utilizado para otros usos; si tiene alguna pregunta consulte con su proveedor de atencin mdica o con su farmacutico. MARCAS COMERCIALES DISPONIBLES: Implanon, Nexplanon Qu le debo informar a mi profesional de la salud antes de tomar este medicamento? Necesita saber si usted presenta alguno de los siguientes problemas o situaciones: -sangrado vaginal anormal -enfermedad vascular o cogulos sanguneos -cncer de mama, cervical, heptico -depresin -diabetes -enfermedad de la vescula biliar -dolores de cabeza -enfermedad cardiaca o ataque cardiaco reciente -alta presin sangunea -alto nivel de colesterol -enfermedad renal -enfermedad heptica -convulsiones -fuma tabaco -una reaccin alrgica o inusual al etonogestrel, otras hormonas, anestsicos o antispticos, medicamentos, alimentos, colorantes o conservantes -si est embarazada o buscando quedar embarazada -si est amamantando a un beb Cmo debo utilizar este medicamento? Este dispositivo se inserta debajo de la piel en la cara interna de la parte superior del brazo por un profesional de Technical sales engineer. Hable con su pediatra para informarse acerca del uso de este medicamento en nios. Puede requerir atencin especial. Sobredosis: Pngase en contacto inmediatamente con un centro toxicolgico o una sala de urgencia si usted cree que haya tomado demasiado medicamento. ATENCIN: [employer] es solo para usted. No  comparta este medicamento con nadie. Qu sucede si me olvido de una dosis? No se aplica en este caso. Qu puede interactuar con este medicamento? No tome esta medicina con ninguno de los siguientes medicamentos: -  amprenavir -bosentano -fosamprenavir Esta medicina tambin puede interactuar con los siguientes medicamentos: -medicamentos barbitricos para inducir el sueo o tratar convulsiones -ciertos medicamentos para las infecciones micticas tales como quetoconazol e itraconazol -griseofulvina -medicamentos para tratar convulsiones, tales como carbamazepina, felbamato, oxcarbazepina, fenitona, topiramato -modafinil -fenilbutazona -rifampicina -algunos medicamentos para tratar la infeccin por VIH tales como atazanavir, indinavir, lopinavir, nelfinavir, tipranavir, ritonavir -hierba de [place] Puede ser que esta lista no menciona todas las posibles interacciones. Informe a su profesional de [employer]	[place] de [employer] productos a base de hierbas, medicamentos de [place] o suplementos nutritivos que est tomando. Si usted fuma, consume bebidas alcohlicas o si utiliza drogas ilegales, indqueselo tambin a su profesional de [employer]	[place]. Algunas sustancias pueden interactuar con su medicamento. A qu debo estar atento al usar Coca[employer]Col[employer]? Este medicamento no protege contra la infeccin por el VIH ([place]) o otras enfermedades de transmisin sexual. Usted debe sentir el implante al presionar con las yemas de los dedos sobre la piel donde se insert. Dgale a su mdico si no se siente el implante. Qu efectos secundarios puedo tener al [employer] este medicamento? Efectos secundarios que debe informar a su mdico o a Barrister's clerk de la salud tan pronto como sea posible: -Chief of Staff como erupcin cutnea, picazn o urticarias, hinchazn de la cara, labios o lengua -ndulos mamarios -cambios en la visin -confusin, dificultad para hablar o entender -orina de color oscura -humor  deprimido -sensacin general de estar enfermo o sntomas gripales -heces claras -prdida del apetito, nuseas -dolor en la regin abdominal superior derecha -dolores de Personnel officer, hinchazon o sensibilidad grave en el abdomen -falta de aliento, dolor en el pecho, hinchazn de la pierna -seales de Nutritional therapist -entumecimiento o debilidad repentina de la cara, brazo o pierna -dificultad para caminar, mareos, prdida de equilibrio o coordinacin -sangrado o flujo vaginal inusual -cansancio o debilidad inusual -color amarillento de los ojos o la piel Efectos secundarios que, por lo general, no requieren Geophysical data processor (debe informarlos a su mdico o a Barrister's clerk de la salud si persisten o si son molestos): -acn -dolor de pecho -cambios de peso -tos -fiebre o escalofros -dolor de cabeza -sangrado menstrual irregular -picazn, ardo o flujo vaginal -dolor o dificultad para Garment/textile technologist -dolor de garganta Puede ser que esta lista no menciona todos los posibles efectos secundarios. Comunquese a su mdico por asesoramiento mdico [employer] Usted puede informar los efectos secundarios a la FDA por telfono al 1-800-FDA-1088. Dnde debo guardar mi medicina? Este medicamento se administra en hospitales o clnicas y no necesitar guardarlo en su domicilio. ATENCIN: Este folleto es un resumen. Puede ser que no cubra toda la posible informacin. Si usted tiene preguntas acerca de esta medicina, consulte con su mdico, su farmacutico o su profesional de Technical sales engineer.  2015, Elsevier/Gold Standard. (2012-03-18 18:26:08)

## 2014-11-07 ENCOUNTER — Other Ambulatory Visit: Payer: Self-pay | Admitting: Gynecology

## 2014-11-07 ENCOUNTER — Encounter: Payer: Self-pay | Admitting: Gynecology

## 2014-11-07 ENCOUNTER — Ambulatory Visit: Payer: Self-pay

## 2014-11-07 DIAGNOSIS — E559 Vitamin D deficiency, unspecified: Secondary | ICD-10-CM

## 2014-11-07 LAB — URINALYSIS W MICROSCOPIC + REFLEX CULTURE
Bacteria, UA: NONE SEEN
Casts: NONE SEEN
Crystals: NONE SEEN
GLUCOSE, UA: NEGATIVE mg/dL
Hgb urine dipstick: NEGATIVE
KETONES UR: NEGATIVE mg/dL
Leukocytes, UA: NEGATIVE
Nitrite: NEGATIVE
PROTEIN: NEGATIVE mg/dL
Specific Gravity, Urine: >1.03 — ABNORMAL HIGH (ref 1.005–1.030)
Urobilinogen, UA: 1 mg/dL (ref 0.0–1.0)
pH: 6 (ref 5.0–8.0)

## 2014-11-07 LAB — VITAMIN D 25 HYDROXY (VIT D DEFICIENCY, FRACTURES): Vit D, 25-Hydroxy: 28 ng/mL — ABNORMAL LOW (ref 30–100)

## 2014-11-07 LAB — FOLLICLE STIMULATING HORMONE: FSH: 8.2 m[IU]/mL

## 2015-01-05 ENCOUNTER — Telehealth: Payer: Self-pay | Admitting: Gynecology

## 2015-01-05 NOTE — Telephone Encounter (Signed)
01/05/15-I checked benefits for both the Mirena and Nexplanon for contraception for this patient and her [facility] insurance will cover either of them for contraception at 100%, no copay. I gave Autumn Haley the information to call the patient and give her these benefits in [place]. wl

## 2015-02-09 ENCOUNTER — Ambulatory Visit (INDEPENDENT_AMBULATORY_CARE_PROVIDER_SITE_OTHER): Payer: BLUE CROSS/BLUE SHIELD | Admitting: Anesthesiology

## 2015-02-09 ENCOUNTER — Other Ambulatory Visit: Payer: BLUE CROSS/BLUE SHIELD

## 2015-02-09 DIAGNOSIS — N912 Amenorrhea, unspecified: Secondary | ICD-10-CM

## 2015-02-09 DIAGNOSIS — Z3042 Encounter for surveillance of injectable contraceptive: Secondary | ICD-10-CM | POA: Diagnosis not present

## 2015-02-09 LAB — PREGNANCY, URINE: PREG TEST UR: NEGATIVE

## 2015-02-09 MED ORDER — MEDROXYPROGESTERONE ACETATE 150 MG/ML IM SUSP
150.0000 mg | Freq: Once | INTRAMUSCULAR | Status: AC
  Administered 2015-02-09: 150 mg via INTRAMUSCULAR

## 2015-02-09 NOTE — Addendum Note (Signed)
Addended by: Thurnell Garbe A on: 02/09/2015 03:22 PM   Modules accepted: Orders

## 2015-05-03 ENCOUNTER — Other Ambulatory Visit: Payer: Self-pay | Admitting: Gynecology

## 2015-05-04 ENCOUNTER — Ambulatory Visit (INDEPENDENT_AMBULATORY_CARE_PROVIDER_SITE_OTHER): Payer: BLUE CROSS/BLUE SHIELD | Admitting: *Deleted

## 2015-05-04 ENCOUNTER — Other Ambulatory Visit: Payer: BLUE CROSS/BLUE SHIELD

## 2015-05-04 ENCOUNTER — Other Ambulatory Visit: Payer: Self-pay

## 2015-05-04 DIAGNOSIS — Z3049 Encounter for surveillance of other contraceptives: Secondary | ICD-10-CM | POA: Diagnosis not present

## 2015-05-04 DIAGNOSIS — E559 Vitamin D deficiency, unspecified: Secondary | ICD-10-CM

## 2015-05-04 MED ORDER — MEDROXYPROGESTERONE ACETATE 150 MG/ML IM SUSP
150.0000 mg | Freq: Once | INTRAMUSCULAR | Status: AC
  Administered 2015-05-04: 150 mg via INTRAMUSCULAR

## 2015-05-04 MED ORDER — MEDROXYPROGESTERONE ACETATE 150 MG/ML IM SUSP: INTRAMUSCULAR | Status: DC

## 2015-05-05 LAB — VITAMIN D 25 HYDROXY (VIT D DEFICIENCY, FRACTURES): VIT D 25 HYDROXY: 27 ng/mL — AB (ref 30–100)

## 2015-05-08 ENCOUNTER — Other Ambulatory Visit: Payer: Self-pay | Admitting: Anesthesiology

## 2015-05-08 DIAGNOSIS — E559 Vitamin D deficiency, unspecified: Secondary | ICD-10-CM

## 2015-05-08 MED ORDER — VITAMIN D (ERGOCALCIFEROL) 1.25 MG (50000 UNIT) PO CAPS: 50000.0000 [IU] | ORAL_CAPSULE | ORAL | Status: DC

## 2015-05-23 ENCOUNTER — Encounter: Payer: Self-pay | Admitting: Gynecology

## 2015-05-23 ENCOUNTER — Ambulatory Visit (INDEPENDENT_AMBULATORY_CARE_PROVIDER_SITE_OTHER): Payer: BLUE CROSS/BLUE SHIELD | Admitting: Gynecology

## 2015-05-23 VITALS — BP 124/70

## 2015-05-23 DIAGNOSIS — Z23 Encounter for immunization: Secondary | ICD-10-CM

## 2015-05-23 DIAGNOSIS — R300 Dysuria: Secondary | ICD-10-CM

## 2015-05-23 DIAGNOSIS — N3000 Acute cystitis without hematuria: Secondary | ICD-10-CM

## 2015-05-23 LAB — URINALYSIS W MICROSCOPIC + REFLEX CULTURE
BILIRUBIN URINE: NEGATIVE
CASTS: NONE SEEN [LPF]
CRYSTALS: NONE SEEN [HPF]
Glucose, UA: NEGATIVE
Ketones, ur: NEGATIVE
Leukocytes, UA: NEGATIVE
Nitrite: NEGATIVE
PROTEIN: NEGATIVE
Specific Gravity, Urine: >1.03 (ref 1.001–1.035)
Yeast: NONE SEEN [HPF]
pH: 5.5 (ref 5.0–8.0)

## 2015-05-23 MED ORDER — PHENAZOPYRIDINE HCL 200 MG PO TABS: 200.0000 mg | ORAL_TABLET | Freq: Three times a day (TID) | ORAL | Status: DC | PRN

## 2015-05-23 MED ORDER — NITROFURANTOIN MONOHYD MACRO 100 MG PO CAPS: 100.0000 mg | ORAL_CAPSULE | Freq: Two times a day (BID) | ORAL | Status: DC

## 2015-05-23 NOTE — Progress Notes (Signed)
   Patient is a 49 year old who presented to the office today stating that for the past several weeks she's been complaining of dysuria and frequency and mild low back discomfort. No fever reported. Patient currently using Depo-Provera injection for contraception. Review of her record indicated that in 2014 and 12 months later in 2015 she had a urinary tract infection and the microorganism identify was Escherichia coli. I reviewed her sensitivity panel and the organism was sensitive to Macrobid.  Her urinalysis today demonstrated the following: 0-5 WBC, 0-2 RBC, few bacteria  Exam: Abdomen: Soft nontender no rebound or guarding some mild suprapubic discomfort was noted Back: No CVA tenderness Vaginal exam not done  Assessment/plan due to patient's symptomatology we are going to place her on Macrobid one by mouth twice a day for 7 days based on previous urine culture sensitivity reports. She is fully aware that we may need to change her antibody depending on the report from this urine culture and sensitivity obtained today. For dysuria going to prescribe her Pyridium 200 mg she is to take 1 tablet 3 times a day for the next 3 days. Also in the event of "honeymoon cystitis" she will take 1 Macrobid after intercourse in the near future. Patient requesting have the flu vaccine today.

## 2015-05-23 NOTE — Patient Instructions (Signed)
Phenazopyridine tablets Qu es este medicamento? La FENAZOPIRIDINA es un analgsico. Genene Churn para Best boy, ardor o molestia causada por una infeccin o irritacin del tracto urinario. Este medicamento no es un antibitico. No curar una infeccin del tracto urinario. Este medicamento puede ser utilizado para otros usos; si tiene alguna pregunta consulte con su proveedor de atencin mdica o con su farmacutico. MARCAS COMERCIALES DISPONIBLES: AZO, Azo-100, Azo-Gesic, Azo-Septic, Azo-Standard, Phenazo, Prodium, Pyridium, Urinary Analgesic, Uristat Qu le debo informar a mi profesional de la salud antes de tomar este medicamento? Necesita saber si usted presenta alguno de los siguientes problemas o situaciones: -deficiencia de glucosa-6-fosfato deshidrogenasa (L-6PD) -enfermedad renal -una reaccin alrgica o inusual a la fenazopiridina, a otros medicamentos, alimentos, colorantes o conservantes -si est embarazada o buscando quedar embarazada -si est amamantando a un beb Cmo debo utilizar este medicamento? Tome este medicamento por va oral con un vaso de agua. Siga las instrucciones de la etiqueta del [place]. Tmelo despus de las comidas. Tome sus dosis a intervalos regulares. No tome su medicamento con una frecuencia mayor que la indicada. No omita ninguna dosis o suspenda el uso de su medicamento antes de lo indicado aun si se siente mejor. No deje de tomarlo excepto si as lo indica su mdico. Hable con su pediatra para informarse acerca del uso de este medicamento en nios. Puede requerir atencin especial. Sobredosis: Pngase en contacto inmediatamente con un centro toxicolgico o una sala de urgencia si usted cree que haya tomado demasiado medicamento. ATENCIN: [employer] es solo para usted. No comparta este medicamento con nadie. Qu sucede si me olvido de una dosis? Si olvida una dosis, tmela lo antes posible. Si es casi la hora de la prxima dosis, tome  slo esa dosis. No tome dosis adicionales o dobles. Qu puede interactuar con este medicamento? No se esperan interacciones. Puede ser que esta lista no menciona todas las posibles interacciones. Informe a su profesional de KB Home	[place] de [employer] productos a base de hierbas, medicamentos de [place] o suplementos nutritivos que est tomando. Si usted fuma, consume bebidas alcohlicas o si utiliza drogas ilegales, indqueselo tambin a su profesional de KB Home	[place]. Algunas sustancias pueden interactuar con su medicamento. A qu debo estar atento al usar Coca-Cola? Si los sntomas no mejoran o si empeoran, consulte con su mdico o con su profesional de KB Home	[place]. Este medicamento produce un color rojo en los lquidos corporales. Este efecto es inofensivo y Armed forces operational officer despus de que deje de tomar este medicamento. Este medicamento cambiar el color de su orina a un color naranja oscura o rojo. El color rojo puede manchar la ropa. Los lentes de contacto se pueden Marketing executive. Es preferible no usar lentes de contacto blandos mientras est tomando este medicamento Si es diabtico podr Therapist, music un resultado positivo falso en los anlisis de determinacin del nivel de Location manager en la orina. Consulte a su proveedor de Geophysical data processor. Qu efectos secundarios puedo tener al [employer] este medicamento? Efectos secundarios que debe informar a su mdico o a Barrister's clerk de la salud tan pronto como sea posible: -Chief of Staff como erupcin cutnea, picazn o urticarias, hinchazn de la cara, labios o lengua -color azul o morado de la piel -dificultad al respirar -fiebre -orinar menos -sangrado, magulladuras inusuales -cansancio o debilidad inusual -vmitos -color amarillento de los ojos o la piel Efectos secundarios que, por lo general, no requieren atencin mdica (debe informarlos a su mdico o a su profesional de la salud si persisten o  si son molestos): -orina de color amarillo  oscuro -dolor de cabeza -Higher education careers adviser Puede ser que esta lista no menciona todos los posibles efectos secundarios. Comunquese a su mdico por asesoramiento mdico [employer]. Usted puede informar los efectos secundarios a la FDA por telfono al 1-800-FDA-1088. Dnde debo guardar mi medicina? Mantngala fuera del alcance de los nios. Gurdela a [employer], entre 15 y 50 grados C (42 y 29 grados F). Protjala de la luz y de la humedad. Deseche todo el medicamento que no haya utilizado, despus de la fecha de vencimiento. ATENCIN: Este folleto es un resumen. Puede ser que no cubra toda la posible informacin. Si usted tiene preguntas acerca de esta medicina, consulte con su mdico, su farmacutico o su profesional de Technical sales engineer.  2015, Elsevier/Gold Standard. (2006-12-08 15:59:00) Nitrofurantoin tablets or capsules Qu es este medicamento? La NITROFURANTONA es un antibitico. Se utiliza en el tratamiento de la infecciones del tracto urinario. Este medicamento puede ser utilizado para otros usos; si tiene alguna pregunta consulte con su proveedor de atencin mdica o con su farmacutico. MARCAS COMERCIALES DISPONIBLES: Macrobid, Macrodantin, Urotoin Qu le debo informar a mi profesional de la salud antes de tomar este medicamento? Necesita saber si usted presenta alguno de los [employer] o situaciones: -anemia -diabetes -deficiencia de glucosa-6-fosfato deshidrogenasa -enfermedad renal -enfermedad heptica -enfermedad pulmonar -otras enfermedades crnicas -una reaccin alrgica o inusual a la nitrofurantona, a otros antibiticos, a otros medicamentos, alimentos, colorantes o conservantes -si est embarazada o buscando quedar embarazada -si est amamantando a un beb Cmo debo utilizar este medicamento? Tome este medicamento por va oral con un vaso de agua. Siga las instrucciones de la etiqueta del [place]. Tome este medicamento con  leche o con alimentos. Tome sus dosis a intervalos regulares. No tome su medicamento con una frecuencia mayor que la indicada. No deje de tomarlo excepto si as lo indica su mdico. Hable con su pediatra para informarse acerca del uso de este medicamento en nios. Aunque este medicamento se puede recetar para condiciones selectivas, las precauciones se aplican. Sobredosis: Pngase en contacto inmediatamente con un centro toxicolgico o una sala de urgencia si usted cree que haya tomado demasiado medicamento. ATENCIN: [employer] es solo para usted. No comparta este medicamento con nadie. Qu sucede si me olvido de una dosis? Si olvida una dosis, tmela lo antes posible. Si es casi la hora de la prxima dosis, tome slo esa dosis. No tome dosis adicionales o dobles. Qu puede interactuar con este medicamento? -anticidos que contienen trisilicato de magnesio -probenecid -antibiticos quinolnicos, tales como ciprofloxacina, lomefloxacino, norfloxacino y ofloxacino -sulfapirazona Puede ser que esta lista no menciona todas las posibles interacciones. Informe a su profesional de KB Home	[place] de [employer] productos a base de hierbas, medicamentos de [place] o suplementos nutritivos que est tomando. Si usted fuma, consume bebidas alcohlicas o si utiliza drogas ilegales, indqueselo tambin a su profesional de KB Home	[place]. Algunas sustancias pueden interactuar con su medicamento. A qu debo estar atento al usar Coca-Cola? Si los sntomas no mejoran o si experimenta nuevos sntomas, consulte con su mdico o su profesional de KB Home	[place]. Beba varios vasos de Public affairs consultant. Si toma este medicamento durante un perodo de [employer], debe visitar a su mdico para chequear su evolucin peridicamente. Si es diabtico, podr Therapist, music un resultado positivo falso en los [employer] de determinacin del nivel de azcar en la orina con algunas marcas de [place] de [place]. Consulte con su mdico. Qu  efectos  secundarios puedo tener al [employer] medicamento? Efectos secundarios que debe informar a su mdico o a Barrister's clerk de la salud tan pronto como sea posible: -Chief of Staff como erupcin cutnea o urticarias, hinchazn de la cara, labios o lengua -dolor en el pecho -tos -dificultad al respirar -mareos, somnolencia -fiebre o infeccin -molestias o dolores articulares -piel plida o teida azul -enrojecimiento, formacin de ampollas, descamacin o aflojamiento de la piel, inclusive dentro de la boca -hormigueo, ardor, Social research officer, government o entumecimiento de las manos o los pies -sangrado o magulladuras inusuales -cansancio o debilidad inusual -color amarillento de ojos o piel Efectos secundarios que, por lo general, no requieren Geophysical data processor (debe informarlos a su mdico o a su profesional de la salud si persisten o si son molestos): -orina de color amarillo oscuro -diarrea -dolor de cabeza -prdida del apetito -nuseas o vmitos -prdida del cabello temporal Puede ser que esta lista no menciona todos los posibles efectos secundarios. Comunquese a su mdico por asesoramiento mdico [employer]. Usted puede informar los efectos secundarios a la FDA por telfono al 1-800-FDA-1088. Dnde debo guardar mi medicina? Mantngala fuera del alcance de los nios. Gurdela a [employer], entre 15 y 37 grados C (78 y 59 grados F). Protjala de la luz. Deseche todo el medicamento que no haya utilizado, despus de la fecha de vencimiento. ATENCIN: Este folleto es un resumen. Puede ser que no cubra toda la posible informacin. Si usted tiene preguntas acerca de esta medicina, consulte con su mdico, su farmacutico o su profesional de Technical sales engineer.  2015, Elsevier/Gold Standard. (2005-03-28 17:03:00) Infeccin urinaria  (Urinary Tract Infection)  La infeccin urinaria puede ocurrir en Clinical cytogeneticist del tracto urinario. El tracto urinario es un sistema de drenaje del  cuerpo por el que se eliminan los desechos y el exceso de [place]. El tracto urinario est formado por dos riones, dos urteres, la vejiga y Geologist, engineering. Los riones son rganos que tienen forma de frijol. Cada rin tiene aproximadamente el tamao del puo. Estn situados debajo de las [place], uno a cada lado de la columna vertebral CAUSAS  La causa de la infeccin son los microbios, que son organismos microscpicos, que incluyen hongos, virus, y bacterias. Estos organismos son tan pequeos que slo pueden verse a travs del microscopio. Las bacterias son los microorganismos que ms comnmente causan infecciones urinarias.  SNTOMAS  Los sntomas pueden variar segn la edad y el sexo del paciente y por la ubicacin de la infeccin. Los sntomas en las mujeres jvenes incluyen la necesidad frecuente e intensa de orinar y una sensacin dolorosa de ardor en la vejiga o en la uretra durante la miccin. Las mujeres y los hombres mayores podrn sentir cansancio, temblores y debilidad y Arts development officer musculares y Social research officer, government abdominal. Si tiene [place], puede significar que la infeccin est en los riones. Otros sntomas son dolor en la espalda o en los lados debajo de las [place], nuseas y vmitos.  DIAGNSTICO  Para diagnosticar una infeccin urinaria, el mdico le preguntar acerca de sus sntomas. [employer] una [place] de [place]. La muestra de orina se analiza para Hydrographic surveyor bacterias y glbulos blancos de Herbalist. Los glbulos blancos se forman en el organismo para ayudar a Radio broadcast assistant las infecciones.  TRATAMIENTO  Por lo general, las infecciones urinarias pueden tratarse con medicamentos. Debido a que la [employer] de las infecciones son causadas por bacterias, por lo general pueden tratarse con antibiticos. La eleccin del antibitico y la duracin del tratamiento depender de sus  sntomas y el tipo de bacteria causante de la infeccin.  INSTRUCCIONES PARA EL CUIDADO EN EL HOGAR   Si le recetaron  antibiticos, tmelos exactamente como su mdico le indique. Termine el medicamento aunque se sienta mejor despus de haber tomado slo algunos.  Beba gran cantidad de lquido para mantener la orina de tono claro o color amarillo plido.  Evite la cafena, el t y las bebidas gaseosas. Estas sustancias irritan la vejiga.  Vaciar la vejiga con frecuencia. Evite retener la orina durante largos perodos.  Vace la vejiga antes y despus de Clinical biochemist.  Despus de mover el intestino, las mujeres deben higienizarse la regin perineal desde adelante hacia atrs. Use slo un papel tissue por vez. SOLICITE ATENCIN MDICA SI:   Siente dolor en la espalda.  Le sube la fiebre.  Los sntomas no mejoran luego de 3 das. SOLICITE ATENCIN MDICA DE INMEDIATO SI:   Siente dolor intenso en la espalda o en la zona inferior del abdomen.  Comienza a sentir escalofros.  Tiene nuseas o vmitos.  Tiene una sensacin continua de quemazn o molestias al [employer]. ASEGRESE DE QUE:   Comprende estas instrucciones.  Controlar su enfermedad.  Solicitar ayuda de inmediato si no mejora o empeora. Document Released: 05/21/2005 Document Revised: 05/05/2012 [facility] Patient Information 2015 [place]. This information is not intended to replace advice given to you by your health care provider. Make sure you discuss any questions you have with your health care provider.

## 2015-05-25 LAB — URINE CULTURE: Colony Count: >=100000

## 2015-07-30 ENCOUNTER — Ambulatory Visit (INDEPENDENT_AMBULATORY_CARE_PROVIDER_SITE_OTHER): Payer: BLUE CROSS/BLUE SHIELD | Admitting: Anesthesiology

## 2015-07-30 ENCOUNTER — Ambulatory Visit (INDEPENDENT_AMBULATORY_CARE_PROVIDER_SITE_OTHER): Payer: BLUE CROSS/BLUE SHIELD

## 2015-07-30 ENCOUNTER — Ambulatory Visit (INDEPENDENT_AMBULATORY_CARE_PROVIDER_SITE_OTHER): Payer: BLUE CROSS/BLUE SHIELD | Admitting: Urgent Care

## 2015-07-30 VITALS — BP 118/82 | HR 72 | Temp 97.9°F | Resp 18 | Ht 60.5 in | Wt 135.8 lb

## 2015-07-30 DIAGNOSIS — M25562 Pain in left knee: Secondary | ICD-10-CM | POA: Diagnosis not present

## 2015-07-30 DIAGNOSIS — Z3042 Encounter for surveillance of injectable contraceptive: Secondary | ICD-10-CM | POA: Diagnosis not present

## 2015-07-30 MED ORDER — MELOXICAM 15 MG PO TABS: 7.5000 mg | ORAL_TABLET | Freq: Every day | ORAL | Status: DC

## 2015-07-30 MED ORDER — MEDROXYPROGESTERONE ACETATE 150 MG/ML IM SUSP
150.0000 mg | Freq: Once | INTRAMUSCULAR | Status: AC
  Administered 2015-07-30: 150 mg via INTRAMUSCULAR

## 2015-07-30 NOTE — Patient Instructions (Signed)
Dolor de rodilla (Knee Pain) El dolor de rodilla es un sntoma muy comn y puede tener muchas causas. Suele desaparecer cuando se siguen las indicaciones del mdico en lo que respecta a Best boy y las molestias en la casa. Sin embargo, puede Warden/ranger en una afeccin que requiere [place]. Algunas afecciones pueden incluir lo siguiente:  Artritis por uso y Holiday representative (artrosis).  Artritis por hinchazn e irritacin (artritis reumatoide o gota).  Un quiste o un crecimiento en la rodilla.  Una infeccin en la articulacin de la rodilla.  Una lesin que no se [place].  [place]ao, hinchazn o irritacin de los tejidos que sostienen la rodilla (distensin de ligamentos o tendinitis). Si el dolor de Development worker, community, tal vez haya que realizar ms estudios para Midwife, los cuales pueden incluir radiografas u otros estudios de diagnstico por imgenes de la rodilla. Adems, es posible que haya que extraerle lquido de la rodilla. El tratamiento del dolor continuo de rodilla depende de la causa, pero puede incluir lo siguiente:  Medicamentos para Best boy o reducir la hinchazn.  Inyecciones de corticoides en la rodilla.  Fisioterapia.  Ciruga. [place] los medicamentos solamente como se lo haya indicado el mdico.  Mantenga la rodilla en reposo y en alto (elevada) mientras est descansando.  No haga cosas que le causen dolor o que lo intensifiquen.  Evite las actividades o los ejercicios de alto impacto, como correr, Art therapist soga o hacer saltos de tijera.  Aplique hielo sobre la zona de la rodilla:  Ponga el hielo en una bolsa plstica.  Coloque una toalla entre la piel y la bolsa de hielo.  Coloque el hielo durante 20 minutos, 2 a 3 veces por da.  Pregntele al mdico si debe usar una Scientist, forensic.  Cuando duerma, pngase una almohada debajo de la rodilla.  Baje de peso si es  necesario. El [place] extra puede generar presin en la rodilla.  No consuma ningn producto que contenga tabaco, lo que incluye cigarrillos, tabaco de Higher education careers adviser o Psychologist, sport and exercise. Si necesita ayuda para dejar de fumar, consulte al mdico. Fumar puede retrasar la curacin de cualquier problema que tenga en el hueso y Water engineer. SOLICITE ATENCIN MDICA SI:  El dolor de rodilla contina, [place].  Tiene fiebre junto con dolor de rodilla.  La rodilla se le tuerce o se le traba.  La rodilla est ms hinchada. SOLICITE ATENCIN MDICA DE INMEDIATO SI:   La articulacin de la rodilla est caliente al tacto.  Tiene dolor en el pecho o dificultad para respirar.   Esta informacin no tiene Marine scientist el consejo del mdico. Asegrese de hacerle al mdico cualquier pregunta que tenga.   Document Released: 01/28/2008 Document Revised: 09/01/2014 Elsevier Interactive Patient Education [employer].

## 2015-07-30 NOTE — Progress Notes (Signed)
    MRN: [id number] DOB: [birth year]  Subjective:   Autumn Haley is a 49 y.o. female presenting for chief complaint of Knee Pain  Reports 4 day history of left knee pain. Pain is intermittent, worse with walking, sharp in nature, hears clicking and feels popping. Knee pain gets better with rest. Has a history of knee pain related to work several months ago. Has tried Advil with some relief. 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 Denies fever, erythema, swelling, trauma, knee buckling. Denies history of arthritis or knee surgeries. Denies any other aggravating or relieving factors, no other questions or concerns.  Autumn Haley has a current medication list which includes the following prescription(s): calcium carbonate-vit d-min, medroxyprogesterone, and vitamin d (ergocalciferol). Also is allergic to levaquin.  Autumn Haley  has a past medical history of NSVD (normal spontaneous vaginal delivery); SAB (spontaneous abortion); Fibroid; NSVD (normal spontaneous vaginal delivery); SAB (spontaneous abortion); Chronic kidney disease; Arthritis; and Vitamin D deficiency. Also  has no past surgical history on file.  Objective:   Vitals: BP 118/82 mmHg  Pulse 72  Temp(Src) 97.9 F (36.6 C) (Oral)  Resp 18  Ht 5' 0.5" (1.537 m)  Wt 135 lb 12.8 oz (61.598 kg)  BMI 26.07 kg/m2  SpO2 98%  Physical Exam  Constitutional: She is oriented to person, place, and time. She appears well-developed and well-nourished.  Cardiovascular: Normal rate.   Pulmonary/Chest: Effort normal.  Musculoskeletal:       Left knee: She exhibits normal range of motion, no swelling, no effusion, no ecchymosis, no deformity, no erythema, normal alignment, no LCL laxity, normal patellar mobility and no bony tenderness. No tenderness found.  Walking with a limp favoring her left knee.  Neurological: She is alert and oriented to person, place,  and time. She has normal reflexes.  Skin: Skin is warm and dry. No rash noted. No erythema. No pallor.  Psychiatric: She has a normal mood and affect.   [facility] reading (PRIMARY) by  Dr. Everlene Farrier and PA-Reesa Gotschall. Left knee - mild joint space narrowing medially.  Dg Knee Complete 4 Views Left  07/30/2015  CLINICAL DATA:  Knee pain of unknown origin. EXAM: LEFT KNEE - COMPLETE 4+ VIEW COMPARISON:  None. FINDINGS: No acute bony abnormality. Specifically, no fracture, subluxation, or dislocation. Soft tissues are intact. No joint effusion. Joint spaces appear maintained. IMPRESSION: No acute bony abnormality. Electronically Signed   By: Rolm Baptise M.D.   On: 07/30/2015 10:20   Assessment and Plan :   1. Left knee pain - Likely due to overuse, patient is resting the next 3 days. Recommended icing, meloxicam, wear knee brace. RTC in 2 weeks if no improvement, consider referral to ortho, patient agreed.  [name], PA-C [facility] [phone] 07/30/2015 9:06 AM

## 2015-08-01 ENCOUNTER — Ambulatory Visit (INDEPENDENT_AMBULATORY_CARE_PROVIDER_SITE_OTHER): Payer: BLUE CROSS/BLUE SHIELD | Admitting: Family Medicine

## 2015-08-01 VITALS — BP 132/80 | HR 66 | Temp 98.3°F | Resp 16 | Ht 60.5 in | Wt 138.4 lb

## 2015-08-01 DIAGNOSIS — M25562 Pain in left knee: Secondary | ICD-10-CM

## 2015-08-01 MED ORDER — PREDNISONE 20 MG PO TABS: ORAL_TABLET | ORAL | Status: DC

## 2015-08-01 MED ORDER — CLASSIC PRENATAL 28-0.8 MG PO TABS: 1.0000 | ORAL_TABLET | Freq: Every day | ORAL | Status: DC

## 2015-08-01 NOTE — Patient Instructions (Signed)
Favor de llamarme si el remedio no funciona en [place].

## 2015-08-01 NOTE — Progress Notes (Signed)
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 She's been having left medial knee pain for about one month. She was seen 3 days ago and given meloxicam 15 mg daily. She has not experienced any relief.  Patient denies any recent trauma to the knee. She had x-rays which were normal when she was seen on Monday.  Patient sees Dr. Uvaldo Rising for her gynecological needs and he's advised her to get a checkup here to manage her blood pressure and cholesterol. Patient requesting a vitamin, stating that Dr. Toney Rakes told her vitamin levels are low.  Objective: No acute distress BP 132/80 mmHg  Pulse 66  Temp(Src) 98.3 F (36.8 C) (Oral)  Resp 16  Ht 5' 0.5" (1.537 m)  Wt 138 lb 6.4 oz (62.778 kg)  BMI 26.57 kg/m2  SpO2 98% HEENT: Unremarkable Left knee: Full range of motion, no effusion, tender in the medial joint line, no bony abnormality  Assessment: This chart was scribed in my presence and reviewed by me personally.    ICD-9-CM ICD-10-CM   1. Left knee pain 719.46 M25.562 predniSONE (DELTASONE) 20 MG tablet     Signed, Robyn Haber, MD

## 2015-08-09 ENCOUNTER — Telehealth: Payer: Self-pay

## 2015-08-09 DIAGNOSIS — M25562 Pain in left knee: Secondary | ICD-10-CM

## 2015-08-09 NOTE — Telephone Encounter (Signed)
Pt was seen on 12/7 by Dr. Joseph Art for Left knee pain - Primary. She is still having lots of pain in her l knee. She would like another refill on her  predniSONE (DELTASONE) 20 MG tablet [[id number]]. CB # [id number]. She has a very thick accent.

## 2015-08-11 MED ORDER — PREDNISONE 20 MG PO TABS: ORAL_TABLET | ORAL | Status: DC

## 2015-08-13 NOTE — Telephone Encounter (Signed)
Message routed to Dr. Joseph Art as he saw her for this issue.

## 2015-08-15 ENCOUNTER — Other Ambulatory Visit: Payer: Self-pay | Admitting: Family Medicine

## 2015-08-15 DIAGNOSIS — M25562 Pain in left knee: Secondary | ICD-10-CM

## 2015-08-15 MED ORDER — PREDNISONE 20 MG PO TABS: ORAL_TABLET | ORAL | Status: DC

## 2015-08-15 NOTE — Progress Notes (Signed)
Left message Rx sent in.

## 2015-08-24 ENCOUNTER — Telehealth: Payer: Self-pay

## 2015-08-24 NOTE — Telephone Encounter (Signed)
Patient speaks Spanish. I had a hard time understanding the patient. I think the patient want a refill on Prednisone 20 MG. [place]. Please call me patient at [phone]

## 2015-10-30 ENCOUNTER — Ambulatory Visit: Payer: BLUE CROSS/BLUE SHIELD

## 2015-11-01 ENCOUNTER — Ambulatory Visit (INDEPENDENT_AMBULATORY_CARE_PROVIDER_SITE_OTHER): Payer: BLUE CROSS/BLUE SHIELD | Admitting: *Deleted

## 2015-11-01 DIAGNOSIS — Z3042 Encounter for surveillance of injectable contraceptive: Secondary | ICD-10-CM

## 2015-11-01 MED ORDER — MEDROXYPROGESTERONE ACETATE 150 MG/ML IM SUSP
150.0000 mg | Freq: Once | INTRAMUSCULAR | Status: AC
  Administered 2015-11-01: 150 mg via INTRAMUSCULAR

## 2015-11-08 ENCOUNTER — Encounter: Payer: BLUE CROSS/BLUE SHIELD | Admitting: Women's Health

## 2015-11-12 ENCOUNTER — Encounter: Payer: BLUE CROSS/BLUE SHIELD | Admitting: Gynecology

## 2015-12-06 ENCOUNTER — Encounter: Payer: Self-pay | Admitting: Gynecology

## 2015-12-06 ENCOUNTER — Ambulatory Visit (INDEPENDENT_AMBULATORY_CARE_PROVIDER_SITE_OTHER): Payer: BLUE CROSS/BLUE SHIELD | Admitting: Gynecology

## 2015-12-06 VITALS — BP 136/88 | Ht 61.0 in | Wt 124.0 lb

## 2015-12-06 DIAGNOSIS — Z01419 Encounter for gynecological examination (general) (routine) without abnormal findings: Secondary | ICD-10-CM | POA: Diagnosis not present

## 2015-12-06 DIAGNOSIS — Z113 Encounter for screening for infections with a predominantly sexual mode of transmission: Secondary | ICD-10-CM | POA: Diagnosis not present

## 2015-12-06 DIAGNOSIS — Z8639 Personal history of other endocrine, nutritional and metabolic disease: Secondary | ICD-10-CM | POA: Diagnosis not present

## 2015-12-06 DIAGNOSIS — F329 Major depressive disorder, single episode, unspecified: Secondary | ICD-10-CM | POA: Diagnosis not present

## 2015-12-06 DIAGNOSIS — D251 Intramural leiomyoma of uterus: Secondary | ICD-10-CM | POA: Diagnosis not present

## 2015-12-06 DIAGNOSIS — F32A Depression, unspecified: Secondary | ICD-10-CM

## 2015-12-06 MED ORDER — SERTRALINE HCL 50 MG PO TABS: 50.0000 mg | ORAL_TABLET | Freq: Every day | ORAL | Status: DC

## 2015-12-06 NOTE — Progress Notes (Signed)
Autumn Haley 1966-02-06 [id number]   History:    50 y.o.  for annual gyn exam who is going through a difficult time since she is separated from her husband cheated on her. Patient wanted to have an STD screen patient was on Depo-Provera injectable contraception every 3 months and her last injection was in January she is not sexually active. As a result of her separation from her spouse she is complaining of feeling withdrawal and signs of depression and anxiety but has no suicidal ideation since she lives or in 2 sons and has family for support. She has had history in the past vitamin D deficiency for which she's currently taking vitamin D 2000 units daily. Patient does have history of fibroid uterus in the past. Patient with no previous history of any abnormal Pap smears.  Past medical history,surgical history, family history and social history were all reviewed and documented in the EPIC chart.  Gynecologic History No LMP recorded. Patient has had an injection. Contraception: none Last Pap: 2015. Results were: normal Last mammogram: 2015. Results were: Normal but dense  Obstetric History OB History  Gravida Para Term Preterm AB SAB TAB Ectopic Multiple Living  3 2 2  1 1    2     # Outcome Date GA Lbr Len/2nd Weight Sex Delivery Anes PTL Lv  3 SAB           2 Term     F Vag-Spont  N Y  1 Term     M Vag-Spont  N Y       ROS: A ROS was performed and pertinent positives and negatives are included in the history.  GENERAL: No fevers or chills. HEENT: No change in vision, no earache, sore throat or sinus congestion. NECK: No pain or stiffness. CARDIOVASCULAR: No chest pain or pressure. No palpitations. PULMONARY: No shortness of breath, cough or wheeze. GASTROINTESTINAL: No abdominal pain, nausea, vomiting or diarrhea, melena or bright red blood per rectum. GENITOURINARY: No urinary frequency, urgency, hesitancy or dysuria. MUSCULOSKELETAL: No joint or muscle pain, no back pain, no  recent trauma. DERMATOLOGIC: No rash, no itching, no lesions. ENDOCRINE: No polyuria, polydipsia, no heat or cold intolerance. No recent change in weight. HEMATOLOGICAL: No anemia or easy bruising or bleeding. NEUROLOGIC: No headache, seizures, numbness, tingling or weakness. PSYCHIATRIC: No depression, no loss of interest in normal activity or change in sleep pattern.     Exam: chaperone present  BP 136/88 mmHg  Ht 5' 1"  (1.549 m)  Wt 124 lb (56.246 kg)  BMI 23.44 kg/m2  Body mass index is 23.44 kg/(m^2).  General appearance : Well developed well nourished female. No acute distress HEENT: Eyes: no retinal hemorrhage or exudates,  Neck supple, trachea midline, no carotid bruits, no thyroidmegaly Lungs: Clear to auscultation, no rhonchi or wheezes, or rib retractions  Heart: Regular rate and rhythm, no murmurs or gallops Breast:Examined in sitting and supine position were symmetrical in appearance, no palpable masses or tenderness,  no skin retraction, no nipple inversion, no nipple discharge, no skin discoloration, no axillary or supraclavicular lymphadenopathy Abdomen: no palpable masses or tenderness, no rebound or guarding Extremities: no edema or skin discoloration or tenderness  Pelvic:  Bartholin, Urethra, Skene Glands: Within normal limits             Vagina: No gross lesions or discharge  Cervix: No gross lesions or discharge  Uterus  Anteverted, 10 weeks size  , non-tender and mobile  Adnexa  Without  masses or tenderness  Anus and perineum  normal   Rectovaginal  normal sphincter tone without palpated masses or tenderness             Hemoccult not indicated     Assessment/Plan:  50 y.o. female for annual exam will have a full STD screening due to the instability of her husband. A GC and Chlamydia culture along with HIV, RPR, hepatitis B and C Will BE obtained today along with her screening blood work consisting of the following: Fasting lipid profile, comprehensive [place]  panel, lipid profile, TSH, and urinalysis. Of note D her depression she has lost approximately 13 pounds in the past several months. Because her history vitamin D deficiency vitamin D level be drawn today. She was reminded to schedule her mammogram which is overdue and to request a three-dimensional mammogram as a result of her dense breasts. Pap smear with HPV screening done today. Her depression she'll be prescribed Zoloft 50 mg one by mouth daily. The risks benefits and pros and cons were discussed. Literature information on Zoloft, and depression was provided in [place].  Terrance Mass MD, 12:44 PM 12/06/2015

## 2015-12-06 NOTE — Patient Instructions (Signed)
Sertraline tablets Qu es este medicamento? La SERTRALINA se utiliza para tratar la depresin. Este medicamento tambin puede ayudar a Engineer, manufacturing con trastorno obsesivo-compulsivo, trastorno de pnico, estrs postraumtico, trastorno disfrico premenstrual (TDPM) o ansiedad social. [place] medicamento puede ser utilizado para otros usos; si tiene alguna pregunta consulte con su proveedor de atencin mdica o con su farmacutico. Qu le debo informar a mi profesional de la salud antes de tomar este medicamento? Necesita saber si usted presenta alguno de los siguientes problemas o situaciones: -trastorno bipolar o antecedentes familiares del trastorno bipolar -diabetes -glaucoma -enfermedad cardiaca -alta presin sangunea -antecedentes de pulso cardiaco irregular -antecedentes de niveles bajos de calcio, magnesio o potasio en la sangre -si consume alcohol con frecuencia -enfermedad heptica -recibe tratamiento electroconvulsivo -convulsiones -ideas suicidas, planeadas o si usted o alguien de su familia ha intentado a un suicidio previo -enfermedad tiroidea -una reaccin alrgica o inusual a la sertralina, a otros medicamentos, alimentos, colorantes o conservantes -si est embarazada o buscando quedar embarazada -si est amamantando a un beb Cmo debo utilizar este medicamento? Tome este medicamento por va oral con un vaso de agua. Siga las instrucciones de la etiqueta del [place]. Este medicamento se puede tomar con o sin alimentos. Tome sus dosis a intervalos regulares. No tome su medicamento con una frecuencia mayor a la indicada. No deje de tomar [employer] repentinamente a menos que as indique su mdico. El detener este medicamento demasiado rpido puede causar efectos secundarios graves o puede empeorar su condicin. Su farmacutico le dar una gua del medicamento especial con cada receta y relleno. Asegrese de leer esta informacin cada vez cuidadosamente. Hable con su  pediatra para informarse acerca del uso de este medicamento en nios. Aunque este medicamento puede ser recetado a nios tan menores como de 7 aos de edad para condiciones selectivas, las precauciones se aplican. Sobredosis: Pngase en contacto inmediatamente con un centro toxicolgico o una sala de urgencia si usted cree que haya tomado demasiado medicamento. ATENCIN: [employer] es solo para usted. No comparta este medicamento con nadie. Qu sucede si me olvido de una dosis? Si olvida una dosis, tmela lo antes posible. Si es casi la hora de la prxima dosis, tome slo esa dosis. No tome dosis adicionales o dobles. Qu puede interactuar con este medicamento? No tome esta medicina con ninguno de los siguientes medicamentos: -ciertos medicamentos para infecciones micticas, tales como fluconazol, itraconazol, quetoconazol, posaconazol, voriconazol -cisapride -disulfiram -dofetilida -linezolid -IMAOs, tales como Carbex, Eldepryl, Marplan, Nardil y Parnate -metronidazol -azul de metileno (va intravenosa) -pimozida -tioridazina -ziprasidona Esta medicina tambin puede interactuar con los siguientes medicamentos: -alcohol -aspirina o medicamentos tipo aspirina -ciertos medicamentos para la depresin, ansiedad o trastornos psicticos -ciertos medicamentos para el pulso cardiaco irregular, tales como flecainida, propafenona -ciertos medicamentos para migraas, tales como almotriptn, eletriptn, frovatriptn, naratriptn, rizatriptn, sumatriptn, zolmitriptn -ciertos medicamentos para conciliar el sueo -ciertos medicamentos para convulsiones, tales como carbamazepina, cido valproico, fenitona -ciertos medicamentos que tratan o previenen cogulos sanguneos, tales como warfarina, enoxaparina, dalteparina -cimetidina -digoxina -diurticos -fentanilo -furazolidona -isoniazida -litio -los AINEs, medicamentos para el dolor o inflamacin, tales como ibuprofeno o naproxeno -otros  medicamentos que prolongan el intervalo QT (causa un ritmo cardiaco anormal) -procarbazina -rasagilina -suplementos como hierba de [place], kava kava, valeriana -tolbutamida -tramadol -triptfano Puede ser que esta lista no menciona todas las posibles interacciones. Informe a su profesional de [employer]	[place] de [employer] productos a base de hierbas, medicamentos de [place] o suplementos nutritivos que est tomando. Si usted fuma, consume  bebidas alcohlicas o si utiliza drogas ilegales, indqueselo tambin a su profesional de [employer]	[place]. Algunas sustancias pueden interactuar con su medicamento. A qu debo estar atento al usar Coca[employer]Cola? Informe a su mdico si sus sntomas no mejoran o si empeoran. Visite a su mdico o a su profesional de la salud para chequear su evolucin peridicamente. Debido que puede ser necesario tomar este medicamento durante varias semanas para que sea posible observar sus efectos en forma [place], es importante que sigue su tratamiento como recetado por su mdico. Los pacientes y sus familias deben estar atentos si empeora la depresin o ideas suicidas. Tambin est atento a cambios repentinos o severos de [employer] sentirse ansioso, agitado, lleno de pnico, irritable, hostil, agresivo, impulsivo, inquietud severa, demasiado excitado y hiperactivo o dificultad para conciliar el sueo. Si esto ocurre, especialmente al comenzar con un tratamiento antidepresivo o al cambiar de dosis, comunquese con su profesional de [employer]	[place]. Puede experimentar somnolencia o mareos. No conduzca ni utilice maquinaria, ni haga nada que Associate Professor en estado de alerta hasta que sepa cmo le afecta este medicamento. No se siente ni se ponga de pie con rapidez, especialmente si es un paciente de edad avanzada. Esto reduce el riesgo de mareos o [employer]. El alcohol puede interferir con el efecto del medicamento. Evite consumir bebidas alcohlicas. Se le podr secar la boca. Masticar  chicle sin azcar, chupar caramelos duros y tomar agua en abundancia le ayudar a mantener la boca hmeda. Si el problema no desaparece o es severo, consulte a su mdico. Qu efectos secundarios puedo tener al [employer] este medicamento? Efectos secundarios que debe informar a su mdico o a Barrister's clerk de la salud tan pronto como sea posible: -Chief of Staff como erupcin cutnea, picazn o urticarias, hinchazn de la cara, labios o lengua -heces de color oscuro o con sangre, urina o vmito con sangre -pulso cardiaco rpido, irregular -sensacin de desmayos o mareos, cadas -alucinaciones, prdida del contacto con la realidad -convulsiones -ideas suicidas u otros cambios de humor -sangrado, magulladuras inusuales -cansancio o debilidad inusual -vmito Efectos secundarios que, por lo general, no requieren atencin mdica (debe informarlos a su mdico o a su profesional de la salud si persisten o si son molestos): -cambios del apetito -cambios en el deseo sexual o capacidad -diarrea -aumento de la sudoracin -indigestin, nuseas -temblores Puede ser que esta lista no menciona todos los posibles efectos secundarios. Comunquese a su mdico por asesoramiento mdico [employer]. Usted puede informar los efectos secundarios a la FDA por telfono al 1-800-FDA-1088. Dnde debo guardar mi medicina? Mantngala fuera del alcance de los nios. [place] a [employer], entre 15 y 34 grados C (14 y 24 grados F). Deseche todo el medicamento que no haya utilizado, despus de la fecha de vencimiento. ATENCIN: Este folleto es un resumen. Puede ser que no cubra toda la posible informacin. Si usted tiene preguntas acerca de esta medicina, consulte con su mdico, su farmacutico o su profesional de Technical sales engineer.    2016, Elsevier/Gold Standard. (2014-10-03 00:00:00) Trastorno depresivo mayor (Major Depressive Disorder) El trastorno depresivo mayor es una enfermedad mental.  Tambin se llamado depresin clnica o depresin unipolar. Produce sentimientos de tristeza, desesperanza o desamparo. Algunas personas con trastorno depresivo mayor no se sienten particularmente tristes, pero pierden Librarian, academic las cosas que solan disfrutar (anhedonia). Tambin puede causar sntomas fsicos. Interfiere en el trabajo, la escuela, las relaciones y otras actividades diarias normales. Puede variar en gravedad, pero es  ms duradera y ms grave que la tristeza que todos sentimos de vez en cuando en nuestras vidas.  Muchas veces es desencadenada por sucesos estresantes o cambios importantes en la vida. Algunos ejemplos de estos factores desencadenantes son el divorcio, la prdida del trabajo o el hogar, [place], y la muerte de un familiar o amigo cercano. A veces aparece sin ninguna razn evidente. Las personas que tienen familiares con depresin mayor o con trastorno bipolar tienen ms riesgo de desarrollar depresin mayor con o sin factores de Psychologist, forensic. Puede ocurrir a Hotel manager. Puede ocurrir slo una vez en su vida(episodio nico de trastorno depresivo mayor). Puede ocurrir varias veces (trastorno depresivo mayor recurrente).  SNTOMAS  Las personas con trastorno depresivo mayor presentan anhedonia o estado de nimo deprimido casi [employer] durante al menos 2 semanas o ms. Los sntomas son:   Sensacin de tristeza Secretary/administrator) o vaco.  Sentimientos de desesperanza o desamparo.  Lagrimeo o episodios de llanto ( es observado por los dems).  Irritabilidad (en nios y adolescentes). Adems del estado de nimo deprimido o la anhedonia o ambos, estos enfermos tienen al menos cuatro de los siguientes sntomas :   Dificultad para dormir o Aeronautical engineer.   Cambio significativo (aumento o disminucin) en el apetito o Financial controller.   Falta de energa o motivacin.  Sentimientos de culpa o desvalorizacin.   Dificultad para concentrarse, recordar o tomar  decisiones.  Movimientos inusualmente lentos (retardo psicomotor retardation) o inquietud (segn lo observado por los dems).   Deseos recurrentes de muerte, pensamientos recurrentes de autoagresin (suicidio) o intento de suicidio. Las personas con trastorno depresivo mayor suelen tener pensamientos persistentes negativos acerca de s mismos, de otras personas y del mundo. Las personas con trastorno depresivo mayor grave pueden experimentar creencias o percepciones distorsionadas sobre el mundo (delirios psicticos). Tambin pueden ver u or cosas que no son reales(alucinaciones psicticas).  DIAGNSTICO  El diagnstico se realiza mediante una evaluacin hecha por el mdico. El mdico le preguntar acerca de los aspectos de su vida cotidiana, como el [place] de nimo, el sueo y el apetito, para ver si usted tiene los sntomas de Environmental education officer. Le har preguntas sobre su historial mdico y el consumo de alcohol o drogas, incluyendo medicamentos recetados. [employer] un examen fsico y Charity fundraiser anlisis de [place]. Esto se debe a que ciertas enfermedades y el uso de determinadas sustancias pueden causar sntomas similares a la depresin (depresin secundaria). Su mdico tambin podra derivarlo a Location manager salud mental para Ardelia Mems evaluacin y [place].  TRATAMIENTO  Es Glass blower/designer los sntomas y Geographical information systems officer. Los siguientes tratamientos pueden indicarse:    Medicamentos - Generalmente se recetan antidepresivos. Los antidepresivos se piensa que corrigen los desequilibrios qumicos en el cerebro que se asocian comnmente a la depresin Chiropractor. Se pueden agregar otros tipos de medicamentos si los sntomas no responden a los antidepresivos solos o si hay ideas delirantes o alucinaciones psicticas.  Psicoterapia - ciertos tipos de psicoterapia pueden ser tiles en el tratamiento del trastorno de la depresin mayor, proporcionando apoyo, educacin y Fish farm manager. Ciertos  tipos de psicoterapia tambin pueden ayudar a superar los pensamientos negativos (terapia cognitivo conductual) y a problemas de relacin que desencadena la depresin mayor (terapia interpersonal). Un especialista en salud mental puede ayudarlo a determinar qu tratamiento es el mejor para usted. La [employer] de los pacientes mejoran con una combinacin de medicacin y psicoterapia. Los tratamientos que implican la estimulacin elctrica del cerebro pueden ser [employer]  en situaciones con sntomas muy graves o cuando los medicamentos y la psicoterapia no funcionan despus de un tiempo. Estos tratamientos incluyen terapia electroconvulsiva, estimulacin magntica transcraneal y estimulacin del nervio vago.    Esta informacin no tiene Marine scientist el consejo del mdico. Asegrese de hacerle al mdico cualquier pregunta que tenga.   Document Released: 12/06/2012 Document Revised: 09/01/2014 Elsevier Interactive Patient Education [employer].

## 2015-12-07 LAB — COMPREHENSIVE METABOLIC PANEL
ALK PHOS: 48 U/L (ref 33–115)
ALT: 13 U/L (ref 6–29)
AST: 14 U/L (ref 10–35)
Albumin: 4.2 g/dL (ref 3.6–5.1)
BUN: 11 mg/dL (ref 7–25)
CALCIUM: 8.7 mg/dL (ref 8.6–10.2)
CHLORIDE: 106 mmol/L (ref 98–110)
CO2: 23 mmol/L (ref 20–31)
Creat: 0.59 mg/dL (ref 0.50–1.10)
Glucose, Bld: 95 mg/dL (ref 65–99)
POTASSIUM: 4 mmol/L (ref 3.5–5.3)
Sodium: 139 mmol/L (ref 135–146)
TOTAL PROTEIN: 6.4 g/dL (ref 6.1–8.1)
Total Bilirubin: 0.6 mg/dL (ref 0.2–1.2)

## 2015-12-07 LAB — CBC WITH DIFFERENTIAL/PLATELET
BASOS PCT: 0 %
Basophils Absolute: 0 cells/uL (ref 0–200)
EOS ABS: 89 {cells}/uL (ref 15–500)
Eosinophils Relative: 1 %
HEMATOCRIT: 41.1 % (ref 35.0–45.0)
HEMOGLOBIN: 13.5 g/dL (ref 11.7–15.5)
Lymphocytes Relative: 27 %
Lymphs Abs: 2403 cells/uL (ref 850–3900)
MCH: 29.9 pg (ref 27.0–33.0)
MCHC: 32.8 g/dL (ref 32.0–36.0)
MCV: 90.9 fL (ref 80.0–100.0)
MONO ABS: 534 {cells}/uL (ref 200–950)
MPV: 9.6 fL (ref 7.5–12.5)
Monocytes Relative: 6 %
NEUTROS ABS: 5874 {cells}/uL (ref 1500–7800)
Neutrophils Relative %: 66 %
Platelets: 279 10*3/uL (ref 140–400)
RBC: 4.52 MIL/uL (ref 3.80–5.10)
RDW: 13.8 % (ref 11.0–15.0)
WBC: 8.9 10*3/uL (ref 3.8–10.8)

## 2015-12-07 LAB — LIPID PANEL
CHOL/HDL RATIO: 2.9 ratio (ref <=5.0)
CHOLESTEROL: 164 mg/dL (ref 125–200)
HDL: 56 mg/dL (ref >=46)
LDL Cholesterol: 99 mg/dL (ref <130)
TRIGLYCERIDES: 47 mg/dL (ref <150)
VLDL: 9 mg/dL (ref <30)

## 2015-12-07 LAB — VITAMIN D 25 HYDROXY (VIT D DEFICIENCY, FRACTURES): Vit D, 25-Hydroxy: 33 ng/mL (ref 30–100)

## 2015-12-07 LAB — URINALYSIS W MICROSCOPIC + REFLEX CULTURE
Bacteria, UA: NONE SEEN [HPF]
Bilirubin Urine: NEGATIVE
CASTS: NONE SEEN [LPF]
Crystals: NONE SEEN [HPF]
GLUCOSE, UA: NEGATIVE
HGB URINE DIPSTICK: NEGATIVE
Ketones, ur: NEGATIVE
LEUKOCYTES UA: NEGATIVE
NITRITE: NEGATIVE
PH: 6.5 (ref 5.0–8.0)
Protein, ur: NEGATIVE
SPECIFIC GRAVITY, URINE: 1.019 (ref 1.001–1.035)
WBC, UA: NONE SEEN WBC/HPF (ref <=5)
YEAST: NONE SEEN [HPF]

## 2015-12-07 LAB — PAP, TP IMAGING W/ HPV RNA, RFLX HPV TYPE 16,18/45: HPV mRNA, High Risk: NOT DETECTED

## 2015-12-07 LAB — HIV ANTIBODY (ROUTINE TESTING W REFLEX): HIV 1&2 Ab, 4th Generation: NONREACTIVE

## 2015-12-07 LAB — HEPATITIS C ANTIBODY: HCV Ab: NEGATIVE

## 2015-12-07 LAB — GC/CHLAMYDIA PROBE AMP
CT Probe RNA: NOT DETECTED
GC Probe RNA: NOT DETECTED

## 2015-12-07 LAB — RPR

## 2015-12-07 LAB — TSH: TSH: 0.84 mIU/L

## 2015-12-07 LAB — HEPATITIS B SURFACE ANTIGEN: HEP B S AG: NEGATIVE

## 2015-12-08 LAB — URINE CULTURE
COLONY COUNT: NO GROWTH
ORGANISM ID, BACTERIA: NO GROWTH

## 2016-12-08 ENCOUNTER — Ambulatory Visit (INDEPENDENT_AMBULATORY_CARE_PROVIDER_SITE_OTHER): Payer: BLUE CROSS/BLUE SHIELD | Admitting: Gynecology

## 2016-12-08 ENCOUNTER — Encounter: Payer: Self-pay | Admitting: Gynecology

## 2016-12-08 ENCOUNTER — Encounter: Payer: BLUE CROSS/BLUE SHIELD | Admitting: Gynecology

## 2016-12-08 VITALS — BP 140/84 | Ht 61.0 in | Wt 131.0 lb

## 2016-12-08 DIAGNOSIS — E559 Vitamin D deficiency, unspecified: Secondary | ICD-10-CM | POA: Diagnosis not present

## 2016-12-08 DIAGNOSIS — Z780 Asymptomatic menopausal state: Secondary | ICD-10-CM

## 2016-12-08 DIAGNOSIS — Z01419 Encounter for gynecological examination (general) (routine) without abnormal findings: Secondary | ICD-10-CM

## 2016-12-08 LAB — CBC WITH DIFFERENTIAL/PLATELET
Basophils Absolute: 83 cells/uL (ref 0–200)
Basophils Relative: 1 %
EOS PCT: 1 %
Eosinophils Absolute: 83 cells/uL (ref 15–500)
HEMATOCRIT: 39.3 % (ref 35.0–45.0)
Hemoglobin: 12.9 g/dL (ref 11.7–15.5)
LYMPHS PCT: 28 %
Lymphs Abs: 2324 cells/uL (ref 850–3900)
MCH: 29.4 pg (ref 27.0–33.0)
MCHC: 32.8 g/dL (ref 32.0–36.0)
MCV: 89.5 fL (ref 80.0–100.0)
MONO ABS: 830 {cells}/uL (ref 200–950)
MONOS PCT: 10 %
MPV: 9.1 fL (ref 7.5–12.5)
Neutro Abs: 4980 cells/uL (ref 1500–7800)
Neutrophils Relative %: 60 %
PLATELETS: 249 10*3/uL (ref 140–400)
RBC: 4.39 MIL/uL (ref 3.80–5.10)
RDW: 14.3 % (ref 11.0–15.0)
WBC: 8.3 10*3/uL (ref 3.8–10.8)

## 2016-12-08 LAB — COMPREHENSIVE METABOLIC PANEL
ALK PHOS: 49 U/L (ref 33–115)
ALT: 13 U/L (ref 6–29)
AST: 16 U/L (ref 10–35)
Albumin: 3.4 g/dL — ABNORMAL LOW (ref 3.6–5.1)
BILIRUBIN TOTAL: 0.5 mg/dL (ref 0.2–1.2)
BUN: 12 mg/dL (ref 7–25)
CALCIUM: 8 mg/dL — AB (ref 8.6–10.2)
CO2: 27 mmol/L (ref 20–31)
CREATININE: 0.48 mg/dL — AB (ref 0.50–1.10)
Chloride: 106 mmol/L (ref 98–110)
GLUCOSE: 92 mg/dL (ref 65–99)
Potassium: 4.1 mmol/L (ref 3.5–5.3)
SODIUM: 137 mmol/L (ref 135–146)
Total Protein: 6.1 g/dL (ref 6.1–8.1)

## 2016-12-08 LAB — LIPID PANEL
Cholesterol: 163 mg/dL (ref <200)
HDL: 62 mg/dL (ref >50)
LDL CALC: 90 mg/dL (ref <100)
Total CHOL/HDL Ratio: 2.6 Ratio (ref <5.0)
Triglycerides: 55 mg/dL (ref <150)
VLDL: 11 mg/dL (ref <30)

## 2016-12-08 NOTE — Progress Notes (Signed)
Autumn Haley 19-Sep-1965 [id number]   History:    51 y.o.  for annual gyn exam with no complaints today. Patient with past history vitamin D deficiency currently taking vitamin D 2000 units daily. Patient reached the menopause 2 years ago prematurely. She has very infrequent hot flashes. Patient with no previous history of any abnormal Pap smears.  Past medical history,surgical history, family history and social history were all reviewed and documented in the EPIC chart.  Gynecologic History No LMP recorded. Patient has had an injection. Contraception: post menopausal status Last Pap: 2015 in 2017. Results were: normal Last mammogram: 2015. Results were: normal  Obstetric History OB History  Gravida Para Term Preterm AB Living  3 2 2   1 2   SAB TAB Ectopic Multiple Live Births  1       2    # Outcome Date GA Lbr Len/2nd Weight Sex Delivery Anes PTL Lv  3 SAB           2 Term     F Vag-Spont  N LIV  1 Term     M Vag-Spont  N LIV       ROS: A ROS was performed and pertinent positives and negatives are included in the history.  GENERAL: No fevers or chills. HEENT: No change in vision, no earache, sore throat or sinus congestion. NECK: No pain or stiffness. CARDIOVASCULAR: No chest pain or pressure. No palpitations. PULMONARY: No shortness of breath, cough or wheeze. GASTROINTESTINAL: No abdominal pain, nausea, vomiting or diarrhea, melena or bright red blood per rectum. GENITOURINARY: No urinary frequency, urgency, hesitancy or dysuria. MUSCULOSKELETAL: No joint or muscle pain, no back pain, no recent trauma. DERMATOLOGIC: No rash, no itching, no lesions. ENDOCRINE: No polyuria, polydipsia, no heat or cold intolerance. No recent change in weight. HEMATOLOGICAL: No anemia or easy bruising or bleeding. NEUROLOGIC: No headache, seizures, numbness, tingling or weakness. PSYCHIATRIC: No depression, no loss of interest in normal activity or change in sleep pattern.      Exam: chaperone present  BP 140/84   Ht 5' 1"  (1.549 m)   Wt 131 lb (59.4 kg)   BMI 24.75 kg/m   Body mass index is 24.75 kg/m.  General appearance : Well developed well nourished female. No acute distress HEENT: Eyes: no retinal hemorrhage or exudates,  Neck supple, trachea midline, no carotid bruits, no thyroidmegaly Lungs: Clear to auscultation, no rhonchi or wheezes, or rib retractions  Heart: Regular rate and rhythm, no murmurs or gallops Breast:Examined in sitting and supine position were symmetrical in appearance, no palpable masses or tenderness,  no skin retraction, no nipple inversion, no nipple discharge, no skin discoloration, no axillary or supraclavicular lymphadenopathy Abdomen: no palpable masses or tenderness, no rebound or guarding Extremities: no edema or skin discoloration or tenderness  Pelvic:  Bartholin, Urethra, Skene Glands: Within normal limits             Vagina: No gross lesions or discharge  Cervix: No gross lesions or discharge  Uterus  anteverted, normal size, shape and consistency, non-tender and mobile  Adnexa  Without masses or tenderness  Anus and perineum  normal   Rectovaginal  normal sphincter tone without palpated masses or tenderness             Hemoccult not indicated     Assessment/Plan:  51 y.o. female for annual exam with premature menopause 2 years ago very minimal vasomotor symptoms on no hormone replacement therapy not sexually active. Patient  with past history vitamin D deficiency will continue on 2000 units daily will check vitamin D level today along with the following fasting blood work: Comprehensive metabolic panel, fasting lipid profile, and CBC. Pap smear not indicated this year. Patient was provided with requisition to schedule her mammogram which is overdue. We discussed importance of monthly breast exam as well as frequent exercise. Patient will need a baseline bone density study in 2019.   Terrance Mass MD, 8:44 AM  12/08/2016

## 2016-12-08 NOTE — Patient Instructions (Signed)
Densitometra sea (Bone Densitometry) La densitometra sea es un estudio de diagnstico por imgenes en el que se utiliza una radiografa especial que mide la cantidad de calcio y otros minerales en los huesos (densidad sea). Este estudio tambin se conoce como examen de densidad mineral sea o radioabsorciometra de doble energa (DEXA). Puede medir la densidad sea en la cadera y la columna. Es similar a una radiografa comn. Tambin pueden hacerle este estudio para:  Diagnosticar una enfermedad que causa huesos dbiles o delgados (osteoporosis).  Predecir el riesgo de un hueso roto (fractura).  Determinar si el tratamiento para la osteoporosis funciona. INFORME A SU MDICO:  Cualquier alergia que tenga.  Todos los medicamentos que utiliza, incluidos vitaminas, hierbas, gotas oftlmicas, cremas y medicamentos de venta libre.  Problemas previos que usted o los miembros de su familia hayan tenido con el uso de anestsicos.  Enfermedades de la sangre que tenga.  Si tiene cirugas previas.  Enfermedades que tenga.  Probabilidad de embarazo.  Cualquier otro estudio mdico al que se haya sometido en los ltimos 14 das en el que se haya utilizado material de contraste. RIESGOS Y COMPLICACIONES En general, se trata de un procedimiento seguro. Sin embargo, pueden ocurrir algunos problemas, entre los que se pueden incluir los siguientes:  Este estudio lo expone a una cantidad muy pequea de radiacin.  Los riesgos de la exposicin a la radiacin pueden ser mayores para los nios por nacer. ANTES DEL PROCEDIMIENTO  No tome ningn suplemento de calcio durante 24 horas antes de realizarse el estudio. Puede comer y beber como lo hace habitualmente.  Qutese todas las joyas de metal, anteojos, aparatos dentales y cualquier otro objeto metlico. PROCEDIMIENTO  Deber recostarse en una camilla. Un generador de rayos X estar ubicado debajo de usted y un dispositivo de imgenes, por  encima.  Se pueden usar otros dispositivos, como cajas o abrazaderas, para posicionar el cuerpo apropiadamente para la exploracin.  Deber permanecer inmvil mientras la mquina explore lentamente su cuerpo.  Las imgenes se muestran en el monitor de una computadora. DESPUS DEL PROCEDIMIENTO Es posible que necesite estudios adicionales ms adelante. Esta informacin no tiene como fin reemplazar el consejo del mdico. Asegrese de hacerle al mdico cualquier pregunta que tenga. Document Released: 05/05/2012 Document Revised: 09/01/2014 Document Reviewed: 01/19/2014 Elsevier Interactive Patient Education  2017 Elsevier Inc.  

## 2016-12-09 LAB — VITAMIN D 25 HYDROXY (VIT D DEFICIENCY, FRACTURES): VIT D 25 HYDROXY: 29 ng/mL — AB (ref 30–100)

## 2016-12-11 ENCOUNTER — Other Ambulatory Visit: Payer: Self-pay | Admitting: Anesthesiology

## 2016-12-11 DIAGNOSIS — R7989 Other specified abnormal findings of blood chemistry: Secondary | ICD-10-CM

## 2016-12-11 DIAGNOSIS — E559 Vitamin D deficiency, unspecified: Secondary | ICD-10-CM

## 2016-12-11 MED ORDER — VITAMIN D (ERGOCALCIFEROL) 1.25 MG (50000 UNIT) PO CAPS: 50000.0000 [IU] | ORAL_CAPSULE | ORAL | 0 refills | Status: DC

## 2016-12-15 ENCOUNTER — Other Ambulatory Visit (INDEPENDENT_AMBULATORY_CARE_PROVIDER_SITE_OTHER): Payer: BLUE CROSS/BLUE SHIELD

## 2016-12-15 ENCOUNTER — Ambulatory Visit: Payer: BLUE CROSS/BLUE SHIELD | Admitting: Gynecology

## 2016-12-15 DIAGNOSIS — R7989 Other specified abnormal findings of blood chemistry: Secondary | ICD-10-CM | POA: Diagnosis not present

## 2016-12-15 DIAGNOSIS — Z23 Encounter for immunization: Secondary | ICD-10-CM | POA: Diagnosis not present

## 2016-12-15 LAB — COMPREHENSIVE METABOLIC PANEL
ALT: 13 U/L (ref 6–29)
AST: 15 U/L (ref 10–35)
Albumin: 3.4 g/dL — ABNORMAL LOW (ref 3.6–5.1)
Alkaline Phosphatase: 51 U/L (ref 33–115)
BILIRUBIN TOTAL: 0.2 mg/dL (ref 0.2–1.2)
BUN: 18 mg/dL (ref 7–25)
CHLORIDE: 109 mmol/L (ref 98–110)
CO2: 21 mmol/L (ref 20–31)
CREATININE: 0.52 mg/dL (ref 0.50–1.10)
Calcium: 8 mg/dL — ABNORMAL LOW (ref 8.6–10.2)
Glucose, Bld: 102 mg/dL — ABNORMAL HIGH (ref 65–99)
Potassium: 4.5 mmol/L (ref 3.5–5.3)
SODIUM: 139 mmol/L (ref 135–146)
TOTAL PROTEIN: 6 g/dL — AB (ref 6.1–8.1)

## 2016-12-18 ENCOUNTER — Other Ambulatory Visit: Payer: Self-pay | Admitting: Anesthesiology

## 2016-12-18 DIAGNOSIS — R7989 Other specified abnormal findings of blood chemistry: Secondary | ICD-10-CM

## 2016-12-18 DIAGNOSIS — E8809 Other disorders of plasma-protein metabolism, not elsewhere classified: Secondary | ICD-10-CM

## 2017-01-07 ENCOUNTER — Encounter: Payer: Self-pay | Admitting: Gynecology

## 2017-01-15 ENCOUNTER — Other Ambulatory Visit: Payer: BLUE CROSS/BLUE SHIELD

## 2017-01-15 DIAGNOSIS — R7989 Other specified abnormal findings of blood chemistry: Secondary | ICD-10-CM

## 2017-01-15 DIAGNOSIS — E8809 Other disorders of plasma-protein metabolism, not elsewhere classified: Secondary | ICD-10-CM

## 2017-01-15 LAB — COMPREHENSIVE METABOLIC PANEL
ALK PHOS: 44 U/L (ref 33–115)
ALT: 11 U/L (ref 6–29)
AST: 14 U/L (ref 10–35)
Albumin: 3.3 g/dL — ABNORMAL LOW (ref 3.6–5.1)
BILIRUBIN TOTAL: 0.3 mg/dL (ref 0.2–1.2)
BUN: 17 mg/dL (ref 7–25)
CO2: 25 mmol/L (ref 20–31)
CREATININE: 0.62 mg/dL (ref 0.50–1.10)
Calcium: 7.9 mg/dL — ABNORMAL LOW (ref 8.6–10.2)
Chloride: 108 mmol/L (ref 98–110)
GLUCOSE: 94 mg/dL (ref 65–99)
Potassium: 4.2 mmol/L (ref 3.5–5.3)
SODIUM: 138 mmol/L (ref 135–146)
Total Protein: 5.7 g/dL — ABNORMAL LOW (ref 6.1–8.1)

## 2017-02-11 ENCOUNTER — Ambulatory Visit (INDEPENDENT_AMBULATORY_CARE_PROVIDER_SITE_OTHER): Payer: BLUE CROSS/BLUE SHIELD | Admitting: Emergency Medicine

## 2017-02-11 ENCOUNTER — Encounter: Payer: Self-pay | Admitting: Emergency Medicine

## 2017-02-11 DIAGNOSIS — E559 Vitamin D deficiency, unspecified: Secondary | ICD-10-CM | POA: Diagnosis not present

## 2017-02-11 MED ORDER — VITAMIN D (ERGOCALCIFEROL) 1.25 MG (50000 UNIT) PO CAPS: 50000.0000 [IU] | ORAL_CAPSULE | ORAL | 0 refills | Status: DC

## 2017-02-11 NOTE — Progress Notes (Signed)
Autumn Haley 51 y.o.   Chief Complaint  Patient presents with  . abnormal labs    done at Dr Uvaldo Rising x  1 month ago - calcium level low and low blood count  . Depression    positive answers in triage score- 18    HISTORY OF PRESENT ILLNESS: This is a 51 y.o. female here for evaluation of hypocalcemia found during GYNMD routine work up several months ago. Taking Drisdol and Ca supplement. Also no longer taking Zoloft as she feels she doesn't need it. Has no complaints; works 2 jobs, stays very active; has occassional joint pains.   HPI   Prior to Admission medications   Medication Sig Start Date End Date Taking? Authorizing Provider  Calcium Carbonate-Vit D-Min (CALTRATE PLUS PO) Take by mouth.   Yes [provider]  Vitamin D, Ergocalciferol, (DRISDOL) 50000 units CAPS capsule Take 1 capsule (50,000 Units total) by mouth every 7 (seven) days. 02/11/17  Yes Horald Pollen, MD  medroxyPROGESTERone (DEPO-PROVERA) 150 MG/ML injection INJECT 1ML INTO THE MUSCLE EVERY 3 MONTHS Patient not taking: Reported on 12/06/2015 05/04/15   Terrance Mass, MD  meloxicam (MOBIC) 15 MG tablet Take 0.5-1 tablets (7.5-15 mg total) by mouth daily. Patient not taking: Reported on 12/08/2016 07/30/15   [name], PA-C  predniSONE (DELTASONE) 20 MG tablet Two daily with food Patient not taking: Reported on 12/06/2015 08/15/15   Robyn Haber, MD  Prenatal Vit-Fe Fumarate-FA (CLASSIC PRENATAL) 28-0.8 MG TABS Take 1 tablet by mouth daily. Patient not taking: Reported on 12/08/2016 08/01/15   Robyn Haber, MD  sertraline (ZOLOFT) 50 MG tablet Take 1 tablet (50 mg total) by mouth daily. Patient not taking: Reported on 02/11/2017 12/06/15   Terrance Mass, MD    Allergies  Allergen Reactions  . Levaquin [Levofloxacin] Rash    Patient Active Problem List   Diagnosis Date Noted  . Vitamin D deficiency 02/11/2017  . Hypocalcemia 02/11/2017  . Renal dysfunction 07/26/2012   . Fibroid uterus 12/26/2011    Past Medical History:  Diagnosis Date  . Arthritis   . Chronic kidney disease    right kidney  . Fibroid    FIBROID UTERUS  . NSVD (normal spontaneous vaginal delivery)    X 2  . NSVD (normal spontaneous vaginal delivery)    X 2  . SAB (spontaneous abortion)   . SAB (spontaneous abortion)   . Vitamin D deficiency     No past surgical history on file.  Social History   Social History  . Marital status: Married    Spouse name: Autumn Haley  . Number of children: 2  . Years of education: 12   Occupational History  . 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 Smoking status: Never Smoker  . Smokeless tobacco: Never Used  . Alcohol use 0.0 oz/week     Comment: OCCASIONALLY  . Drug use: No  . Sexual activity: Not Currently    Partners: Male    Birth control/ protection: Rhythm   Other Topics Concern  . Not on file   Social History Narrative   From [place], [place].  Lives here with her husband and 2 children.    No family history on file.   Review of Systems  Constitutional: Negative.  Negative for chills, fever, malaise/fatigue and weight loss.  HENT: Negative.  Negative for congestion, nosebleeds, sinus pain and sore throat.   Eyes: Negative.  Negative for blurred  vision and double vision.  Respiratory: Negative.  Negative for cough and shortness of breath.   Cardiovascular: Negative.  Negative for chest pain, palpitations and leg swelling.  Gastrointestinal: Negative.  Negative for abdominal pain, diarrhea, nausea and vomiting.  Genitourinary: Negative.  Negative for dysuria and hematuria.  Musculoskeletal: Negative for back pain and myalgias.  Skin: Negative.  Negative for rash.  Neurological: Negative for dizziness, sensory change, focal weakness, weakness and headaches.  Endo/Heme/Allergies: Negative.   All other systems reviewed and are negative.    Vitals:   02/11/17 1010  BP:  130/68  Pulse: (!) 57  Resp: 16  Temp: 98 F (36.7 C)     Physical Exam  Constitutional: She is oriented to person, place, and time. She appears well-developed and well-nourished.  HENT:  Head: Normocephalic and atraumatic.  Nose: Nose normal.  Mouth/Throat: Oropharynx is clear and moist. No oropharyngeal exudate.  Eyes: Conjunctivae and EOM are normal. Pupils are equal, round, and reactive to light.  Neck: Normal range of motion. Neck supple. No JVD present. No thyromegaly present.  Cardiovascular: Normal rate, regular rhythm, normal heart sounds and intact distal pulses.   Pulmonary/Chest: Effort normal and breath sounds normal.  Abdominal: Soft. Bowel sounds are normal. She exhibits no distension. There is no tenderness.  Musculoskeletal: Normal range of motion.  Lymphadenopathy:    She has no cervical adenopathy.  Neurological: She is alert and oriented to person, place, and time. No sensory deficit. She exhibits normal muscle tone.  Skin: Skin is warm and dry. Capillary refill takes less than 2 seconds. No rash noted.  Psychiatric: She has a normal mood and affect. Her behavior is normal.  Vitals reviewed.    ASSESSMENT & PLAN: Autumn Haley was seen today for abnormal labs and depression.  Diagnoses and all orders for this visit:  Hypocalcemia -     Ambulatory referral to Endocrinology -     Parathyroid hormone, intact (no Ca) -     Comprehensive metabolic panel -     CBC with Differential/Platelet -     TSH -     VITAMIN D 25 Hydroxy (Vit-D Deficiency, Fractures) -     Magnesium -     Phosphorus  Vitamin D deficiency -     Vitamin D, Ergocalciferol, (DRISDOL) 50000 units CAPS capsule; Take 1 capsule (50,000 Units total) by mouth every 7 (seven) days. -     Parathyroid hormone, intact (no Ca) -     Comprehensive metabolic panel -     CBC with Differential/Platelet -     TSH -     VITAMIN D 25 Hydroxy (Vit-D Deficiency, Fractures) -     Magnesium -      Phosphorus   Patient Instructions   We recommend that you schedule a mammogram for breast cancer screening. Typically, you do not need a referral to do this. Please contact a local imaging center to schedule your mammogram.  [facility] - [phone]  *ask for the Radiology Department [facility] ([place]) - [phone] or [phone]  [facility] - [phone] [place] [phone] [facility] - [phone]  *ask for the [facility] - [phone]  *ask for the Radiology Department [facility] - [phone]  *ask for the [place] - [phone]    IF you received an x-ray today, you will receive an invoice from [facility] Radiology. Please contact [facility]  Radiology at [phone] with questions or concerns regarding your invoice.   IF you received labwork today, you will receive an invoice from [facility]. Please contact [facility] at [phone] with questions or concerns regarding your invoice.   Our billing staff will not be able to assist you with questions regarding bills from these companies.  You will be contacted with the lab results as soon as they are available. The fastest way to get your results is to activate your My Chart account. Instructions are located on the last page of this paperwork. If you have not heard from [place] regarding the results in 2 weeks, please contact this office.    Hipocalcemia en los adultos (Hypocalcemia, Adult) La hipocalcemia ocurre cuando el nivel de calcio de una persona est por debajo de lo normal. El calcio es un mineral que el organismo utiliza de muchas formas. La falta de calcio puede afectar el corazn y los msculos, hacer que los huesos sean ms propensos a las fracturas y causar otros problemas. CAUSAS Esta afeccin puede ser causada por lo siguiente:  Disminucin de la  produccin (hipoparatiroidismo) o uso inadecuado de la hormona paratiroidea.  Problemas o la extirpacin [place] de las glndulas paratiroideas.  Problemas con la funcin paratiroidea despus de la extirpacin de la glndula tiroides.  Falta (deficiencia) de vitamina D, de magnesio o ambos.  Problemas renales. Algunas causas menos frecuentes incluyen lo siguiente:  Problemas intestinales que interfieren con la absorcin de nutrientes.  Alcoholismo.  Bajos niveles de una protena del organismo llamada albmina.  Inflamacin del pncreas (pancreatitis).  Algunos medicamentos.  Infecciones graves (sepsis).  Ciertas enfermedades, como sarcoidosis o hemocromatosis, que hacen que las glndulas paratiroideas se llenen de clulas o sustancias que normalmente no estn presentes.  Descomposicin de grandes cantidades de fibra muscular.  Altos niveles de fosfatos en el cuerpo.  Cncer.  Transfusiones de [employer] que normalmente ocurren en casos de traumatismos graves. SNTOMAS Los sntomas de esta afeccin incluyen lo siguiente:  Entumecimiento y hormigueo de los dedos de los pies o alrededor de Equities trader.  Dolores o [employer], especialmente en las piernas, los pies y la espalda.  Espasmos musculares.  Clicos o dolor en el abdomen.  Problemas de memoria, confusin o dificultad para pensar.  Depresin, ansiedad, irritabilidad o cambios en la personalidad.  Desmayos.  Dolor en el pecho.  Dificultad para tragar.  Cambios en el sonido de la voz.  Dificultad para respirar o sibilancias.  Fatiga y debilidad general. Los sntomas de la hipocalcemia grave incluyen lo siguiente:  Sacudidas incontrolables (convulsiones).  Reflejo exagerado y prolongado de cierre gltico (laringoespasmo).  Latidos cardacos rpidos (palpitaciones) y ritmo cardaco anormal (arritmias). Los sntomas a largo plazo de esta afeccin incluyen lo siguiente:  Herold Harms y uas  gruesos y Publishing rights manager.  Piel seca o enfermedades de la piel (psoriasis, eczema, o dermatitis) de larga duracin (crnicas).  Opacidad en el cristalino del ojo (cataratas). DIAGNSTICO Por lo general, este trastorno se diagnostica mediante un anlisis de [place]. Tambin se pueden hacer otros estudios para ayudar a Office manager causa preexistente de la afeccin. Por ejemplo, se puede realizar un estudio que registra la actividad elctrica del corazn (electrocardiograma o ECG). TRATAMIENTO El tratamiento de esta afeccin puede incluir lo siguiente:  Calcio administrado por boca (va oral) o de una va intravenosa que se coloca en una vena. El mtodo utilizado para la administracin del calcio depender de la gravedad de la afeccin.  Otros minerales (electrolitos), [employer]. El [place] de  otros tratamientos depender de la causa de la afeccin. INSTRUCCIONES PARA EL CUIDADO EN EL HOGAR  Siga las indicaciones del mdico o nutricionista en lo que respecta a la dieta.  Tome los suplementos solamente como se lo haya indicado el mdico.  Concurra a todas las visitas de control como se lo haya indicado el mdico. Esto es importante. SOLICITE ATENCIN MDICA SI:  Aumenta la fatiga.  Aumentan los espasmos musculares.  Observa una nueva hinchazn en los pies, tobillos o piernas.  Sufre cambios en el estado de nimo, en la memoria o en la personalidad. SOLICITE ATENCIN MDICA DE INMEDIATO SI:  Siente dolor en el pecho.  Siente latidos cardacos rpidos o irregulares de [employer].  Tiene dificultad para respirar.  Se desmaya.  Comienza a tener convulsiones.  Se siente confundido. Esta informacin no tiene Marine scientist el consejo del mdico. Asegrese de hacerle al mdico cualquier pregunta que tenga. Document Released: 07/28/2012 Document Revised: 12/03/2015 Document Reviewed: 12/27/2014 Elsevier Interactive Patient Education  2018 Elsevier  Inc.      Agustina Caroli, MD [facility]

## 2017-02-11 NOTE — Patient Instructions (Addendum)
We recommend that you schedule a mammogram for breast cancer screening. Typically, you do not need a referral to do this. Please contact a local imaging center to schedule your mammogram.  [facility] - [phone]  *ask for the Radiology Department [facility] ([place]) - [phone] or [phone]  [facility] - [phone] [place] [phone] [facility] - [phone]  *ask for the [facility] - [phone]  *ask for the Radiology Department [facility] - [phone]  *ask for the [place] - [phone]    IF you received an x-ray today, you will receive an invoice from [facility] Radiology. Please contact [facility] Radiology at [phone] with questions or concerns regarding your invoice.   IF you received labwork today, you will receive an invoice from [place]. Please contact [facility] at [phone] with questions or concerns regarding your invoice.   Our billing staff will not be able to assist you with questions regarding bills from these companies.  You will be contacted with the lab results as soon as they are available. The fastest way to get your results is to activate your My Chart account. Instructions are located on the last page of this paperwork. If you have not heard from [place] regarding the results in 2 weeks, please contact this office.    Hipocalcemia en los adultos (Hypocalcemia, Adult) La hipocalcemia ocurre cuando el nivel de calcio de una persona est por debajo de lo normal. El calcio es un mineral que el organismo utiliza de muchas formas. La falta de calcio puede afectar el corazn y los msculos, hacer que los huesos sean ms propensos a las fracturas y causar otros problemas. CAUSAS Esta afeccin puede ser causada por lo siguiente:  Disminucin de la produccin (hipoparatiroidismo) o uso  inadecuado de la hormona paratiroidea.  Problemas o la extirpacin [place] de las glndulas paratiroideas.  Problemas con la funcin paratiroidea despus de la extirpacin de la glndula tiroides.  Falta (deficiencia) de vitamina D, de magnesio o ambos.  Problemas renales. Algunas causas menos frecuentes incluyen lo siguiente:  Problemas intestinales que interfieren con la absorcin de nutrientes.  Alcoholismo.  Bajos niveles de una protena del organismo llamada albmina.  Inflamacin del pncreas (pancreatitis).  Algunos medicamentos.  Infecciones graves (sepsis).  Ciertas enfermedades, como sarcoidosis o hemocromatosis, que hacen que las glndulas paratiroideas se llenen de clulas o sustancias que normalmente no estn presentes.  Descomposicin de grandes cantidades de fibra muscular.  Altos niveles de fosfatos en el cuerpo.  Cncer.  Transfusiones de [employer] que normalmente ocurren en casos de traumatismos graves. SNTOMAS Los sntomas de esta afeccin incluyen lo siguiente:  Entumecimiento y hormigueo de los dedos de los pies o alrededor de Equities trader.  Dolores o [employer], especialmente en las piernas, los pies y la espalda.  Espasmos musculares.  Clicos o dolor en el abdomen.  Problemas de memoria, confusin o dificultad para pensar.  Depresin, ansiedad, irritabilidad o cambios en la personalidad.  Desmayos.  Dolor en el pecho.  Dificultad para tragar.  Cambios en el sonido de la voz.  Dificultad para respirar o sibilancias.  Fatiga y debilidad general. Los sntomas de la hipocalcemia grave incluyen lo siguiente:  Sacudidas incontrolables (convulsiones).  Reflejo exagerado y prolongado de cierre gltico (laringoespasmo).  Latidos cardacos rpidos (palpitaciones) y ritmo cardaco anormal (arritmias). Los sntomas a largo plazo de esta afeccin incluyen lo siguiente:  Herold Harms y  uas gruesos y Publishing rights manager.  Piel seca o  enfermedades de la piel (psoriasis, eczema, o dermatitis) de larga duracin (crnicas).  Opacidad en el cristalino del ojo (cataratas). DIAGNSTICO Por lo general, este trastorno se diagnostica mediante un anlisis de [place]. Tambin se pueden hacer otros estudios para ayudar a Office manager causa preexistente de la afeccin. Por ejemplo, se puede realizar un estudio que registra la actividad elctrica del corazn (electrocardiograma o ECG). TRATAMIENTO El tratamiento de esta afeccin puede incluir lo siguiente:  Calcio administrado por boca (va oral) o de una va intravenosa que se coloca en una vena. El mtodo utilizado para la administracin del calcio depender de la gravedad de la afeccin.  Otros minerales (electrolitos), [employer]. [place] de otros tratamientos depender de la causa de la afeccin. INSTRUCCIONES PARA EL CUIDADO EN EL HOGAR  Siga las indicaciones del mdico o nutricionista en lo que respecta a la dieta.  Tome los suplementos solamente como se lo haya indicado el mdico.  Concurra a todas las visitas de control como se lo haya indicado el mdico. Esto es importante. SOLICITE ATENCIN MDICA SI:  Aumenta la fatiga.  Aumentan los espasmos musculares.  Observa una nueva hinchazn en los pies, tobillos o piernas.  Sufre cambios en el estado de nimo, en la memoria o en la personalidad. SOLICITE ATENCIN MDICA DE INMEDIATO SI:  Siente dolor en el pecho.  Siente latidos cardacos rpidos o irregulares de [employer].  Tiene dificultad para respirar.  Se desmaya.  Comienza a tener convulsiones.  Se siente confundido. Esta informacin no tiene Marine scientist el consejo del mdico. Asegrese de hacerle al mdico cualquier pregunta que tenga. Document Released: 07/28/2012 Document Revised: 12/03/2015 Document Reviewed: 12/27/2014 Elsevier Interactive Patient Education  2018 [employer].

## 2017-02-12 ENCOUNTER — Encounter: Payer: Self-pay | Admitting: Radiology

## 2017-02-12 LAB — CBC WITH DIFFERENTIAL/PLATELET
BASOS: 1 %
Basophils Absolute: 0.1 10*3/uL (ref 0.0–0.2)
EOS (ABSOLUTE): 0.1 10*3/uL (ref 0.0–0.4)
EOS: 1 %
HEMOGLOBIN: 13.7 g/dL (ref 11.1–15.9)
Hematocrit: 41.1 % (ref 34.0–46.6)
IMMATURE GRANS (ABS): 0 10*3/uL (ref 0.0–0.1)
IMMATURE GRANULOCYTES: 0 %
Lymphocytes Absolute: 2.1 10*3/uL (ref 0.7–3.1)
Lymphs: 27 %
MCH: 30.2 pg (ref 26.6–33.0)
MCHC: 33.3 g/dL (ref 31.5–35.7)
MCV: 91 fL (ref 79–97)
MONOCYTES: 9 %
Monocytes Absolute: 0.7 10*3/uL (ref 0.1–0.9)
NEUTROS ABS: 4.8 10*3/uL (ref 1.4–7.0)
Neutrophils: 62 %
PLATELETS: 272 10*3/uL (ref 150–379)
RBC: 4.54 x10E6/uL (ref 3.77–5.28)
RDW: 14.3 % (ref 12.3–15.4)
WBC: 7.8 10*3/uL (ref 3.4–10.8)

## 2017-02-12 LAB — COMPREHENSIVE METABOLIC PANEL
ALT: 16 IU/L (ref 0–32)
AST: 17 IU/L (ref 0–40)
Albumin/Globulin Ratio: 1.5 (ref 1.2–2.2)
Albumin: 4.1 g/dL (ref 3.5–5.5)
Alkaline Phosphatase: 64 IU/L (ref 39–117)
BUN/Creatinine Ratio: 20 (ref 9–23)
BUN: 13 mg/dL (ref 6–24)
Bilirubin Total: 0.6 mg/dL (ref 0.0–1.2)
CALCIUM: 8.5 mg/dL — AB (ref 8.7–10.2)
CO2: 25 mmol/L (ref 20–29)
CREATININE: 0.64 mg/dL (ref 0.57–1.00)
Chloride: 102 mmol/L (ref 96–106)
GFR, EST AFRICAN AMERICAN: 122 mL/min/{1.73_m2} (ref >59)
GFR, EST NON AFRICAN AMERICAN: 106 mL/min/{1.73_m2} (ref >59)
Globulin, Total: 2.7 g/dL (ref 1.5–4.5)
Glucose: 96 mg/dL (ref 65–99)
Potassium: 4.3 mmol/L (ref 3.5–5.2)
Sodium: 141 mmol/L (ref 134–144)
TOTAL PROTEIN: 6.8 g/dL (ref 6.0–8.5)

## 2017-02-12 LAB — MAGNESIUM: Magnesium: 2.1 mg/dL (ref 1.6–2.3)

## 2017-02-12 LAB — VITAMIN D 25 HYDROXY (VIT D DEFICIENCY, FRACTURES): Vit D, 25-Hydroxy: 53.6 ng/mL (ref 30.0–100.0)

## 2017-02-12 LAB — PHOSPHORUS: Phosphorus: 4 mg/dL (ref 2.5–4.5)

## 2017-02-12 LAB — PARATHYROID HORMONE, INTACT (NO CA)

## 2017-02-12 LAB — TSH: TSH: 1.11 u[IU]/mL (ref 0.450–4.500)

## 2017-02-18 ENCOUNTER — Telehealth: Payer: Self-pay | Admitting: Family Medicine

## 2017-02-18 NOTE — Telephone Encounter (Signed)
Please advise 

## 2017-02-18 NOTE — Telephone Encounter (Signed)
PT IS WANTING DR SAGARDIA TO GIVE HER A CALL ABOUT PAIN IN HER KNEE AND HAND SHE WANT ANOTHER REFILL ON MOBIC THAT MARIO HAD GAVE HER ONCE BEFORE

## 2017-02-19 NOTE — Telephone Encounter (Signed)
OK to refill Mobic.

## 2017-02-20 ENCOUNTER — Telehealth: Payer: Self-pay | Admitting: Emergency Medicine

## 2017-02-20 NOTE — Telephone Encounter (Signed)
How many ?

## 2017-02-20 NOTE — Telephone Encounter (Signed)
Pt has called several times this week to get a refill on her mobic and is has been approved to be called in but the pharmacy says that they do not have the medication and the patient is hurting and needs her meds   Best number [phone]

## 2017-02-20 NOTE — Telephone Encounter (Signed)
Does patient need an OV ?

## 2017-02-20 NOTE — Telephone Encounter (Signed)
Please note I am in her chart as her PCP but I have never seen this patient - she last saw Sagardia but was last Rx Mobic in 12/16 for a 6 months supply but there is a phone message where Sagardia ok'd the Rx.

## 2017-02-23 ENCOUNTER — Encounter: Payer: Self-pay | Admitting: Emergency Medicine

## 2017-02-23 ENCOUNTER — Ambulatory Visit (INDEPENDENT_AMBULATORY_CARE_PROVIDER_SITE_OTHER): Payer: BLUE CROSS/BLUE SHIELD | Admitting: Emergency Medicine

## 2017-02-23 VITALS — BP 143/78 | HR 65 | Temp 98.3°F | Resp 16 | Ht 60.0 in | Wt 126.4 lb

## 2017-02-23 DIAGNOSIS — M2550 Pain in unspecified joint: Secondary | ICD-10-CM | POA: Diagnosis not present

## 2017-02-23 MED ORDER — DICLOFENAC SODIUM 75 MG PO TBEC: 75.0000 mg | DELAYED_RELEASE_TABLET | Freq: Two times a day (BID) | ORAL | 0 refills | Status: AC

## 2017-02-23 MED ORDER — DICLOFENAC SODIUM 75 MG PO TBEC: 75.0000 mg | DELAYED_RELEASE_TABLET | Freq: Two times a day (BID) | ORAL | 0 refills | Status: DC

## 2017-02-23 NOTE — Progress Notes (Signed)
Autumn Haley 51 y.o.   Chief Complaint  Patient presents with  . Medication Refill    PER PATIENT SHE NEEDS TO TALK WITH  DR     HISTORY OF PRESENT ILLNESS: This is a 51 y.o. female complaining of pain to several joints on and off for months; physically active but denies injury or any other significant symptoms.  HPI   Prior to Admission medications   Medication Sig Start Date End Date Taking? Authorizing Provider  Calcium Carbonate-Vit D-Min (CALTRATE PLUS PO) Take by mouth.   Yes [provider]  Vitamin D, Ergocalciferol, (DRISDOL) 50000 units CAPS capsule Take 1 capsule (50,000 Units total) by mouth every 7 (seven) days. 02/11/17  Yes Horald Pollen, MD  medroxyPROGESTERone (DEPO-PROVERA) 150 MG/ML injection INJECT 1ML INTO THE MUSCLE EVERY 3 MONTHS Patient not taking: Reported on 12/06/2015 05/04/15   Terrance Mass, MD  meloxicam (MOBIC) 15 MG tablet Take 0.5-1 tablets (7.5-15 mg total) by mouth daily. Patient not taking: Reported on 12/08/2016 07/30/15   [name], PA-C  predniSONE (DELTASONE) 20 MG tablet Two daily with food Patient not taking: Reported on 12/06/2015 08/15/15   Robyn Haber, MD  Prenatal Vit-Fe Fumarate-FA (CLASSIC PRENATAL) 28-0.8 MG TABS Take 1 tablet by mouth daily. Patient not taking: Reported on 12/08/2016 08/01/15   Robyn Haber, MD  sertraline (ZOLOFT) 50 MG tablet Take 1 tablet (50 mg total) by mouth daily. Patient not taking: Reported on 02/23/2017 12/06/15   Terrance Mass, MD    Allergies  Allergen Reactions  . Levaquin [Levofloxacin] Rash    Patient Active Problem List   Diagnosis Date Noted  . Vitamin D deficiency 02/11/2017  . Hypocalcemia 02/11/2017  . Renal dysfunction 07/26/2012  . Fibroid uterus 12/26/2011    Past Medical History:  Diagnosis Date  . Arthritis   . Chronic kidney disease    right kidney  . Fibroid    FIBROID UTERUS  . NSVD (normal spontaneous vaginal delivery)    X 2  . NSVD  (normal spontaneous vaginal delivery)    X 2  . SAB (spontaneous abortion)   . SAB (spontaneous abortion)   . Vitamin D deficiency     No past surgical history on file.  Social History   Social History  . Marital status: Married    Spouse name: Donnie Aho  . Number of children: 2  . Years of education: 12   Occupational History  . 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 Smoking status: Never Smoker  . Smokeless tobacco: Never Used  . Alcohol use 0.0 oz/week     Comment: OCCASIONALLY  . Drug use: No  . Sexual activity: Not Currently    Partners: Male    Birth control/ protection: Rhythm   Other Topics Concern  . Not on file   Social History Narrative   From [place], [place].  Lives here with her husband and 2 children.    No family history on file.   Review of Systems  Constitutional: Negative.  Negative for chills and fever.  HENT: Negative.   Eyes: Negative.   Respiratory: Negative.  Negative for shortness of breath.   Cardiovascular: Negative.  Negative for chest pain.  Gastrointestinal: Negative for nausea and vomiting.  Genitourinary: Negative.  Negative for dysuria.  Musculoskeletal: Positive for joint pain. Negative for back pain and neck pain.  Skin: Negative.  Negative for rash.  Neurological: Negative for dizziness, sensory change, focal  weakness and headaches.  Endo/Heme/Allergies: Negative.   All other systems reviewed and are negative.  Vitals:   02/23/17 1517  BP: (!) 143/78  Pulse: 65  Resp: 16  Temp: 98.3 F (36.8 C)    Physical Exam  Constitutional: She is oriented to person, place, and time. She appears well-developed and well-nourished.  HENT:  Head: Normocephalic.  Eyes: EOM are normal. Pupils are equal, round, and reactive to light.  Neck: Normal range of motion. Neck supple.  Cardiovascular: Normal rate and regular rhythm.   Pulmonary/Chest: Effort normal.  Musculoskeletal: Normal range  of motion. She exhibits no edema, tenderness or deformity.  Neurological: She is alert and oriented to person, place, and time. She displays normal reflexes. No sensory deficit. She exhibits normal muscle tone.  Skin: Skin is warm and dry. Capillary refill takes less than 2 seconds. No rash noted.  Psychiatric: She has a normal mood and affect. Her behavior is normal.  Vitals reviewed.    ASSESSMENT & PLAN: Autumn Haley was seen today for medication refill.  Diagnoses and all orders for this visit:  Arthralgia, unspecified joint  Other orders -     Discontinue: diclofenac (VOLTAREN) 75 MG EC tablet; Take 1 tablet (75 mg total) by mouth 2 (two) times daily. -     diclofenac (VOLTAREN) 75 MG EC tablet; Take 1 tablet (75 mg total) by mouth 2 (two) times daily.    Patient Instructions       IF you received an x-ray today, you will receive an invoice from [facility]. Please contact [facility] at [phone] with questions or concerns regarding your invoice.   IF you received labwork today, you will receive an invoice from [place]. Please contact [facility] at [phone] with questions or concerns regarding your invoice.   Our billing staff will not be able to assist you with questions regarding bills from these companies.  You will be contacted with the lab results as soon as they are available. The fastest way to get your results is to activate your My Chart account. Instructions are located on the last page of this paperwork. If you have not heard from [place] regarding the results in 2 weeks, please contact this office.    We recommend that you schedule a mammogram for breast cancer screening. Typically, you do not need a referral to do this. Please contact a local imaging center to schedule your mammogram.  [facility] - [phone]  *ask for the Radiology [place] ([place] - [phone] or [phone]  [facility] - [phone] [place] [phone] [facility] - [phone]  *ask for the [facility] - [phone]  *ask for the Radiology Department [facility] - [phone]  *ask for the Mammography Department [place] - [phone] Dolor en las articulaciones (Joint Pain) El dolor en las articulaciones puede tener muchas causas. Se puede formar un hematoma en la articulacin, la articulacin puede infectarse, debilitarse por la edad avanzada o causar dolor debido al ejercicio. Es probable que Conservation officer, historic buildings desaparezca si sigue las indicaciones del mdico en lo que respecta al cuidado Financial planner. Si el dolor persiste, tal vez haya que realizar ms estudios para ayudar a Animator causa de la afeccin. CUIDADOS EN EL HOGAR Controle su afeccin para ver si hay cambios. Siga estas instrucciones como se lo hayan indicado para Best boy que est sintiendo:  [employer]  los medicamentos solamente como se lo haya indicado el mdico.  Mantenga la articulacin dolorida en reposo como se lo haya indicado el mdico. Si el mdico se lo indica, levante (eleve) la articulacin dolorida por encima del nivel del corazn cuando est sentado o acostado.  No haga cosas que le causen dolor o que lo intensifiquen.  Si se lo indican, pngase hielo en la zona dolorida: ? Ponga el hielo en una bolsa plstica. ? Coloque una [employer] piel y la bolsa de hielo. ? Coloque el hielo durante 77mnutos, 2 a 3veces por da.  Use una venda elstica, una frula o un cabestrillo como se lo haya indicado el mdico. Afloje la venda elstica o la frula si pierde la sensibilidad en los dedos de las manos o de los pies (se le adormecen), siente hormigueo o se le enfran y se tornan de cOptician, dispensing  Comience a ejercitar o a eAeronautical engineerse lo haya indicado el mdico. Pregntele al mdico qu tipos de ejercicios  son seguros para usted.  Concurra a todas las visitas de control como se lo haya indicado el mdico. Esto es importante. SOLICITE AYUDA SI:  El dolor empeora y los medicamentos no lo aFrance  El dolor en la articulacin no mejora en el trmino de 3das.  El hematoma o la hinchazn aumentan.  Tiene fiebre.  Adelgaza 10 libras (4,5 kg) o ms, sin proponrselo. SOLICITE AYUDA DE INMEDIATO SI:  No puede mover la articulacin.  [place] pies se le adormecen o se le enfran y se tornan de cOptician, dispensing Esta informacin no tiene cMarine scientistel consejo del mdico. Asegrese de hacerle al mdico cualquier pregunta que tenga. Document Released: 07/31/2011 Document Revised: 12/03/2015 Document Reviewed: 05/23/2014 Elsevier Interactive Patient Education  2018 Elsevier Inc.      MAgustina Caroli MD [facility]

## 2017-02-23 NOTE — Telephone Encounter (Signed)
15, no refills.

## 2017-02-23 NOTE — Patient Instructions (Addendum)
IF you received an x-ray today, you will receive an invoice from [facility]. Please contact [facility] Radiology at [phone] with questions or concerns regarding your invoice.   IF you received labwork today, you will receive an invoice from [place]. Please contact [facility] at [phone] with questions or concerns regarding your invoice.   Our billing staff will not be able to assist you with questions regarding bills from these companies.  You will be contacted with the lab results as soon as they are available. The fastest way to get your results is to activate your My Chart account. Instructions are located on the last page of this paperwork. If you have not heard from [place] regarding the results in 2 weeks, please contact this office.    We recommend that you schedule a mammogram for breast cancer screening. Typically, you do not need a referral to do this. Please contact a local imaging center to schedule your mammogram.  [facility] - [phone]  *ask for the Radiology [place] ([place]) - [phone] or [phone]  [facility] - [phone] [place] [phone] [facility] - [phone]  *ask for the [facility] - [phone]  *ask for the Radiology Department [facility] - [phone]  *ask for the Mammography Department [place] - [phone] Dolor en las articulaciones (Joint Pain) El dolor en las articulaciones puede tener muchas causas. Se puede formar un hematoma en la articulacin, la articulacin puede infectarse, debilitarse por la edad avanzada o causar dolor debido al ejercicio. Es probable que Conservation officer, historic buildings desaparezca si sigue las indicaciones del mdico en lo que respecta al cuidado Financial planner. Si el dolor persiste, tal vez haya que realizar ms estudios para ayudar a Animator causa de la  afeccin. CUIDADOS EN EL HOGAR Controle su afeccin para ver si hay cambios. Siga estas instrucciones como se lo hayan indicado para Best boy que est sintiendo:  [place] los medicamentos solamente como se lo haya indicado el mdico.  Mantenga la articulacin dolorida en reposo como se lo haya indicado el mdico. Si el mdico se lo indica, levante (eleve) la articulacin dolorida por encima del nivel del corazn cuando est sentado o acostado.  No haga cosas que le causen dolor o que lo intensifiquen.  Si se lo indican, pngase hielo en la zona dolorida: ? Ponga el hielo en una bolsa plstica. ? Coloque una [employer] piel y la bolsa de hielo. ? Coloque el hielo durante 83mnutos, 2 a 3veces por da.  Use una venda elstica, una frula o un cabestrillo como se lo haya indicado el mdico. Afloje la venda elstica o la frula si pierde la sensibilidad en los dedos de las manos o de los pies (se le adormecen), siente hormigueo o se le enfran y se tornan de cOptician, dispensing  Comience a ejercitar o a eAeronautical engineerse lo haya indicado el mdico. Pregntele al mdico qu tipos de ejercicios son seguros para usted.  Concurra a todas las visitas de control como se lo haya indicado el mdico. Esto es importante. SOLICITE AYUDA SI:  El dolor empeora y los medicamentos no lo aFrance  El dolor en la articulacin no mejora en el trmino de 3das.  El hematoma o la hinchazn aumentan.  Tiene fiebre.  Adelgaza 10 libras (4,5 kg) o ms, sin proponrselo. SOLICITE AYUDA DE INMEDIATO SI:  No puede mover la articulacin.  [place] los pies se le adormecen o se le enfran y se tornan de Optician, dispensing. Esta informacin no tiene Marine scientist el consejo del mdico. Asegrese de hacerle al mdico cualquier pregunta que tenga. Document Released: 07/31/2011 Document Revised: 12/03/2015 Document Reviewed: 05/23/2014 Elsevier Interactive Patient Education   [employer].

## 2017-02-24 ENCOUNTER — Other Ambulatory Visit: Payer: Self-pay

## 2017-02-24 DIAGNOSIS — M2550 Pain in unspecified joint: Secondary | ICD-10-CM

## 2017-02-24 MED ORDER — MELOXICAM 15 MG PO TABS: 7.5000 mg | ORAL_TABLET | Freq: Every day | ORAL | 0 refills | Status: DC

## 2017-02-24 NOTE — Telephone Encounter (Signed)
Rx refilled and sent to [facility]. Will need OV for further refills.

## 2017-02-24 NOTE — Telephone Encounter (Signed)
Saw her yesterday in the office. Mobic changed to Voltaren. Taken care of. Thanks.

## 2017-02-24 NOTE — Telephone Encounter (Signed)
rx sent

## 2017-03-27 ENCOUNTER — Telehealth: Payer: Self-pay | Admitting: *Deleted

## 2017-03-27 NOTE — Telephone Encounter (Signed)
Faxed signed Rx to [facility] delivery for diclofenac 75 mg with 90 days. Confirmation page received 3:39 pm.

## 2017-03-30 ENCOUNTER — Telehealth: Payer: Self-pay | Admitting: *Deleted

## 2017-03-30 NOTE — Telephone Encounter (Signed)
Faxed Rx for diclofenac to [facility], confirmation page received at 5:50pm.

## 2017-04-02 ENCOUNTER — Telehealth: Payer: Self-pay | Admitting: Emergency Medicine

## 2017-04-02 NOTE — Telephone Encounter (Signed)
C[facility] with [facility] was calling to get a verbal order on the pt meloxicam for 90 days, w/ 3 refills.  Please adv  [phone]

## 2017-04-10 ENCOUNTER — Other Ambulatory Visit: Payer: Self-pay

## 2017-04-10 MED ORDER — MELOXICAM 15 MG PO TABS: 7.5000 mg | ORAL_TABLET | Freq: Every day | ORAL | 3 refills | Status: DC

## 2017-04-10 NOTE — Telephone Encounter (Signed)
Medication reordered.

## 2017-04-10 NOTE — Telephone Encounter (Signed)
Yes

## 2017-04-10 NOTE — Telephone Encounter (Signed)
Would you like to order?

## 2017-04-26 ENCOUNTER — Encounter (HOSPITAL_COMMUNITY): Payer: Self-pay | Admitting: Emergency Medicine

## 2017-04-26 DIAGNOSIS — S6981XA Other specified injuries of right wrist, hand and finger(s), initial encounter: Secondary | ICD-10-CM | POA: Diagnosis not present

## 2017-04-26 DIAGNOSIS — Z23 Encounter for immunization: Secondary | ICD-10-CM | POA: Insufficient documentation

## 2017-04-26 DIAGNOSIS — Y939 Activity, unspecified: Secondary | ICD-10-CM | POA: Insufficient documentation

## 2017-04-26 DIAGNOSIS — S61214A Laceration without foreign body of right ring finger without damage to nail, initial encounter: Secondary | ICD-10-CM | POA: Diagnosis not present

## 2017-04-26 DIAGNOSIS — Y92511 Restaurant or cafe as the place of occurrence of the external cause: Secondary | ICD-10-CM | POA: Insufficient documentation

## 2017-04-26 DIAGNOSIS — Z79899 Other long term (current) drug therapy: Secondary | ICD-10-CM | POA: Insufficient documentation

## 2017-04-26 DIAGNOSIS — N189 Chronic kidney disease, unspecified: Secondary | ICD-10-CM | POA: Diagnosis not present

## 2017-04-26 DIAGNOSIS — S61216A Laceration without foreign body of right little finger without damage to nail, initial encounter: Secondary | ICD-10-CM | POA: Diagnosis not present

## 2017-04-26 DIAGNOSIS — Y990 Civilian activity done for income or pay: Secondary | ICD-10-CM | POA: Insufficient documentation

## 2017-04-26 DIAGNOSIS — W269XXA Contact with unspecified sharp object(s), initial encounter: Secondary | ICD-10-CM | POA: Insufficient documentation

## 2017-04-26 DIAGNOSIS — S61212A Laceration without foreign body of right middle finger without damage to nail, initial encounter: Secondary | ICD-10-CM | POA: Diagnosis not present

## 2017-04-26 NOTE — ED Triage Notes (Signed)
Reports cutting right middle finger and right ring finger as well as pinky on right hand while cooking tonight.  Pinky open to air,  Other two fingers covered.  Bleeding controlled in triage.

## 2017-04-27 ENCOUNTER — Emergency Department (HOSPITAL_COMMUNITY)
Admission: EM | Admit: 2017-04-27 | Discharge: 2017-04-27 | Disposition: A | Discharge status: Home / Self Care | Payer: BLUE CROSS/BLUE SHIELD | Attending: Emergency Medicine | Admitting: Emergency Medicine

## 2017-04-27 DIAGNOSIS — S61219A Laceration without foreign body of unspecified finger without damage to nail, initial encounter: Secondary | ICD-10-CM

## 2017-04-27 MED ORDER — TETANUS-DIPHTH-ACELL PERTUSSIS 5-2.5-18.5 LF-MCG/0.5 IM SUSP
0.5000 mL | Freq: Once | INTRAMUSCULAR | Status: AC
  Administered 2017-04-27: 0.5 mL via INTRAMUSCULAR
  Filled 2017-04-27: qty 0.5

## 2017-04-27 MED ORDER — LIDOCAINE HCL (PF) 1 % IJ SOLN
30.0000 mL | Freq: Once | INTRAMUSCULAR | Status: AC
  Administered 2017-04-27: 30 mL
  Filled 2017-04-27: qty 30

## 2017-04-27 MED ORDER — BACITRACIN ZINC 500 UNIT/GM EX OINT
1.0000 "application " | TOPICAL_OINTMENT | Freq: Once | CUTANEOUS | Status: AC
  Administered 2017-04-27: 1 via TOPICAL

## 2017-04-27 NOTE — Discharge Instructions (Signed)

## 2017-04-27 NOTE — ED Notes (Signed)
ED Provider at bedside. 

## 2017-04-27 NOTE — ED Provider Notes (Signed)
[place] DEPT Provider Note   CSN: [id number] Arrival date & time: 04/26/17  2213     History   Chief Complaint Chief Complaint  Patient presents with  . Extremity Laceration    HPI   Blood pressure (!) 168/88, pulse (!) 57, temperature 97.9 F (36.6 C), temperature source Oral, resp. rate 18, height 5' (1.524 m), weight 59.9 kg (132 lb), SpO2 100 %.  Autumn Haley is a 51 y.o. female complaining of laceration to right fifth fourth and third digit sustained just prior to arrival patient was working in [employer] she put her hands in the sink and cut it on a sharp object, likely a knife, there doesn't appear to be any broken glass in that area. Her last tetanus shot is unknown. She is right-hand dominant. She denies any numbness, weakness, decreased range of motion.  Past Medical History:  Diagnosis Date  . Arthritis   . Chronic kidney disease    right kidney  . Fibroid    FIBROID UTERUS  . NSVD (normal spontaneous vaginal delivery)    X 2  . NSVD (normal spontaneous vaginal delivery)    X 2  . SAB (spontaneous abortion)   . SAB (spontaneous abortion)   . Vitamin D deficiency     Patient Active Problem List   Diagnosis Date Noted  . Arthralgia 02/23/2017  . Vitamin D deficiency 02/11/2017  . Hypocalcemia 02/11/2017  . Renal dysfunction 07/26/2012  . Fibroid uterus 12/26/2011    History reviewed. No pertinent surgical history.  OB History    Gravida Para Term Preterm AB Living   3 2 2   1 2    SAB TAB Ectopic Multiple Live Births   1       2       Home Medications    Prior to Admission medications   Medication Sig Start Date End Date Taking? Authorizing Provider  Calcium Carbonate-Vit D-Min (CALTRATE PLUS PO) Take by mouth.    [provider]  medroxyPROGESTERone (DEPO-PROVERA) 150 MG/ML injection INJECT 1ML INTO THE MUSCLE EVERY 3 MONTHS Patient not taking: Reported on 12/06/2015 05/04/15   Terrance Mass, MD  meloxicam (MOBIC) 15  MG tablet Take 0.5-1 tablets (7.5-15 mg total) by mouth daily. 04/10/17   Horald Pollen, MD  predniSONE (DELTASONE) 20 MG tablet Two daily with food Patient not taking: Reported on 12/06/2015 08/15/15   Robyn Haber, MD  Prenatal Vit-Fe Fumarate-FA (CLASSIC PRENATAL) 28-0.8 MG TABS Take 1 tablet by mouth daily. Patient not taking: Reported on 12/08/2016 08/01/15   Robyn Haber, MD  sertraline (ZOLOFT) 50 MG tablet Take 1 tablet (50 mg total) by mouth daily. Patient not taking: Reported on 02/23/2017 12/06/15   Terrance Mass, MD  Vitamin D, Ergocalciferol, (DRISDOL) 50000 units CAPS capsule Take 1 capsule (50,000 Units total) by mouth every 7 (seven) days. 02/11/17   Horald Pollen, MD    Family History No family history on file.  Social History Social History  Substance Use Topics  . Smoking status: Never Smoker  . Smokeless tobacco: Never Used  . Alcohol use 0.0 oz/week     Comment: OCCASIONALLY     Allergies   Levaquin [levofloxacin]   Review of Systems Review of Systems  A complete review of systems was obtained and all systems are negative except as noted in the HPI and PMH.   Physical Exam Updated Vital Signs BP (!) 168/88 (BP Location: Left Arm)   Pulse (!) 57  Temp 97.9 F (36.6 C) (Oral)   Resp 18   Ht 5' (1.524 m)   Wt 59.9 kg (132 lb)   SpO2 100%   BMI 25.78 kg/m   Physical Exam  Constitutional: She is oriented to person, place, and time. She appears well-developed and well-nourished. No distress.  HENT:  Head: Normocephalic and atraumatic.  Mouth/Throat: Oropharynx is clear and moist.  Eyes: Pupils are equal, round, and reactive to light. Conjunctivae and EOM are normal.  Neck: Normal range of motion.  Cardiovascular: Normal rate, regular rhythm and intact distal pulses.   Pulmonary/Chest: Effort normal and breath sounds normal.  Abdominal: Soft. There is no tenderness.  Musculoskeletal: Normal range of motion.       Hands: 3x 1  cm full thickness lacerations as diagrammed. Neurovacularly intact with full ROM and strength to each interphalangeal joint (tested in isolation) in both flexion and extension.    Neurological: She is alert and oriented to person, place, and time.  Skin: She is not diaphoretic.  Psychiatric: She has a normal mood and affect.  Nursing note and vitals reviewed.    ED Treatments / Results  Labs (all labs ordered are listed, but only abnormal results are displayed) Labs Reviewed - No data to display  EKG  EKG Interpretation None       Radiology No results found.  Procedures .Marland KitchenLaceration Repair Date/Time: 04/27/2017 6:12 AM Performed by: Monico Blitz Authorized by: Monico Blitz   Consent:    Consent obtained:  Verbal   Consent given by:  Patient Anesthesia (see MAR for exact dosages):    Anesthesia method:  Nerve block   Block needle gauge:  25 G   Block anesthetic:  Lidocaine 1% w/o epi   Block injection procedure:  Anatomic landmarks identified   Block outcome:  Anesthesia achieved Laceration details:    Location:  Finger   Finger location:  R small finger   Length (cm):  1 Repair type:    Repair type:  Simple Pre-procedure details:    Preparation:  Patient was prepped and draped in usual sterile fashion Exploration:    Wound exploration: wound explored through full range of motion     Contaminated: no   Treatment:    Area cleansed with:  Saline   Amount of cleaning:  Extensive   Irrigation solution:  Sterile saline   Irrigation volume:  2L   Irrigation method:  Syringe   Visualized foreign bodies/material removed: no   Mucous membrane repair:    Suture size:  5-0   Wound mucous membrane closure material used: ethylon.   (including critical care time)  Medications Ordered in ED Medications  lidocaine (PF) (XYLOCAINE) 1 % injection 30 mL (30 mLs Infiltration Given 04/27/17 0615)  Tdap (BOOSTRIX) injection 0.5 mL (0.5 mLs Intramuscular Given 04/27/17  0615)  bacitracin ointment 1 application (1 application Topical Given 04/27/17 0616)     Initial Impression / Assessment and Plan / ED Course  I have reviewed the triage vital signs and the nursing notes.  Pertinent labs & imaging results that were available during my care of the patient were reviewed by me and considered in my medical decision making (see chart for details).     Vitals:   04/26/17 2233 04/26/17 2234 04/27/17 0224  BP: (!) 162/88  (!) 168/88  Pulse: 64  (!) 57  Resp: 18  18  Temp: 98.3 F (36.8 C)  97.9 F (36.6 C)  TempSrc: Oral  Oral  SpO2: 99%  100%  Weight:  59.9 kg (132 lb)   Height:  5' (1.524 m)     Medications  lidocaine (PF) (XYLOCAINE) 1 % injection 30 mL (30 mLs Infiltration Given 04/27/17 0615)  Tdap (BOOSTRIX) injection 0.5 mL (0.5 mLs Intramuscular Given 04/27/17 0615)  bacitracin ointment 1 application (1 application Topical Given 04/27/17 0616)    Autumn Haley is 51 y.o. female presenting with finger laceration.  No signs of tendon/joint involvement. Tdap booster given. Pressure irrigation performed. Laceration occurred < 8 hours prior to repair which was well tolerated. Pt has no co morbidities to effect normal wound healing. Discussed suture home care w pt and answered questions. Pt to f-u for wound check and suture removal in 7 days. Pt is hemodynamically stable with no complaints prior to dc.   Evaluation does not show pathology that would require ongoing emergent intervention or inpatient treatment. Pt is hemodynamically stable and mentating appropriately. Discussed findings and plan with patient/guardian, who agrees with care plan. All questions answered. Return precautions discussed and outpatient follow up given.   Final Clinical Impressions(s) / ED Diagnoses   Final diagnoses:  Laceration of finger of right hand without foreign body without damage to nail, unspecified finger, initial encounter    New Prescriptions New Prescriptions     No medications on file     Waynetta Pean 04/27/17 0093    Veryl Speak, MD 04/27/17 [phone]

## 2017-04-27 NOTE — ED Notes (Signed)
Pt verbalizes understanding of d/c instructions. Pt discharged with all belongings in possession.

## 2017-05-04 ENCOUNTER — Ambulatory Visit (INDEPENDENT_AMBULATORY_CARE_PROVIDER_SITE_OTHER): Payer: BLUE CROSS/BLUE SHIELD | Admitting: Family Medicine

## 2017-05-04 ENCOUNTER — Encounter: Payer: Self-pay | Admitting: Family Medicine

## 2017-05-04 VITALS — BP 168/95 | HR 63 | Temp 97.7°F | Resp 18 | Ht 60.71 in | Wt 132.2 lb

## 2017-05-04 DIAGNOSIS — S61219D Laceration without foreign body of unspecified finger without damage to nail, subsequent encounter: Secondary | ICD-10-CM | POA: Diagnosis not present

## 2017-05-04 DIAGNOSIS — Z4802 Encounter for removal of sutures: Secondary | ICD-10-CM | POA: Diagnosis not present

## 2017-05-04 NOTE — Patient Instructions (Signed)
     IF you received an x-ray today, you will receive an invoice from [facility]. Please contact [facility] at [phone] with questions or concerns regarding your invoice.   IF you received labwork today, you will receive an invoice from [facility]. Please contact [facility] at [phone] with questions or concerns regarding your invoice.   Our billing staff will not be able to assist you with questions regarding bills from these companies.  You will be contacted with the lab results as soon as they are available. The fastest way to get your results is to activate your My Chart account. Instructions are located on the last page of this paperwork. If you have not heard from us regarding the results in 2 weeks, please contact this office.     

## 2017-05-04 NOTE — Progress Notes (Signed)
   9/10/201811:47 AM  Autumn Haley [birth year], 51 y.o. female [id number]  Chief Complaint  Patient presents with  . Suture / Staple Removal    right hand/fingers    HPI:   Patient is a 51 y.o. female with no past medical history significant who presents today for suture removal. 8 days ago she cut her 2nd, 3rd, 4th fingers while at work. Stiches done in ER. She denies any redness, warmth, pain, drainage.   She also denies having elevated BP, she reports she is very nervous today.  Depression screen PHQ 2/9 05/04/2017  Decreased Interest 0  Down, Depressed, Hopeless 0  PHQ - 2 Score 0    No Known Allergies  No current outpatient prescriptions on file prior to visit.   No current facility-administered medications on file prior to visit.     History reviewed. No pertinent past medical history.  History reviewed. No pertinent surgical history.  Social History  Substance Use Topics  . Smoking status: Never Smoker  . Smokeless tobacco: Never Used  . Alcohol use No    History reviewed. No pertinent family history.  ROS Per HPI  OBJECTIVE:  Blood pressure (!) 168/95, pulse 63, temperature 97.7 F (36.5 C), temperature source Oral, resp. rate 18, height 5' 0.71" (1.542 m), weight 132 lb 3.2 oz (60 kg), SpO2 98 %.  Physical Exam  Gen: AAOX 3, NAD MSK: right hand, 2-4 fingers, palmar side, stiches intact, skin well approximated, no erythema or drainage. Neurovascularly intact.  No results found for this or any previous visit.   ASSESSMENT and PLAN:  1. Laceration of finger of right hand without foreign body without damage to nail, unspecified finger, subsequent encounter   2. Visit for suture removal 6 stiches removed without difficulties, steri-stripe applied. Routine care instructions and precautions given.       Rutherford Guys, MD Primary Care at [facility], [place] [address] Ph.  [phone] Fax [phone]

## 2017-05-05 ENCOUNTER — Ambulatory Visit: Payer: Self-pay | Admitting: Internal Medicine

## 2017-05-12 ENCOUNTER — Encounter (HOSPITAL_COMMUNITY): Payer: Self-pay | Admitting: Emergency Medicine

## 2017-06-17 ENCOUNTER — Encounter: Payer: Self-pay | Admitting: Emergency Medicine

## 2017-06-17 ENCOUNTER — Ambulatory Visit (INDEPENDENT_AMBULATORY_CARE_PROVIDER_SITE_OTHER): Payer: BLUE CROSS/BLUE SHIELD | Admitting: Emergency Medicine

## 2017-06-17 ENCOUNTER — Ambulatory Visit (INDEPENDENT_AMBULATORY_CARE_PROVIDER_SITE_OTHER): Payer: BLUE CROSS/BLUE SHIELD

## 2017-06-17 VITALS — BP 160/90 | HR 64 | Temp 97.8°F | Resp 16 | Ht 60.5 in | Wt 137.2 lb

## 2017-06-17 DIAGNOSIS — M19042 Primary osteoarthritis, left hand: Secondary | ICD-10-CM | POA: Diagnosis not present

## 2017-06-17 DIAGNOSIS — M79645 Pain in left finger(s): Secondary | ICD-10-CM

## 2017-06-17 DIAGNOSIS — M79644 Pain in right finger(s): Secondary | ICD-10-CM | POA: Diagnosis not present

## 2017-06-17 DIAGNOSIS — M19041 Primary osteoarthritis, right hand: Secondary | ICD-10-CM

## 2017-06-17 NOTE — Patient Instructions (Addendum)
Continue taking Voltaren. Follow up with Rheumatologist.  Artritis (Arthritis) El significado del trmino artritis es dolor de las articulaciones. Tambin puede significar enfermedad articular. Una articulacin es TEFL teacher de unin de [place]. Las personas que sufren artritis pueden tener lo siguiente:  Enrojecimiento de las articulaciones.  Inflamaciones articulares.  Articulaciones rgidas.  Articulaciones calientes.  Autumn Haley.  Sensacin de estar enfermo. CUIDADOS EN EL HOGAR Est atento a cualquier cambio en los sntomas. Tome estas medidas para Best boy y la hinchazn. Medicamentos  [employer] de venta libre y los recetados solamente como se lo haya indicado el mdico.  No tome aspirina para Best boy si el mdico le dice que puede tener gota. Actividades  Ponga la articulacin en reposo si el mdico se lo indica.  Evite las actividades que intensifiquen Conservation officer, historic buildings.  Haga ejercicios con la articulacin como se lo haya indicado el mdico. Intente hacer ejercicios tales como: ? Natacin. ? Gimnasia acutica. ? Andar en bicicleta. ? Caminar. Cuidado de la articulacin  Si la articulacin est hinchada, mantngala elevada si el mdico se lo indic.  Si al despertar por la maana, nota que la articulacin est rgida, intente tomar una ducha con agua tibia.  Si es diabtico, no se ponga calor sin consultarle al mdico.  Si se lo indican, pngase calor en la articulacin: ? Coloque una toalla entre la articulacin y la compresa caliente o la almohadilla trmica. ? Coloque el calor en la zona durante 20 o 79mnutos.  Si se lo indican, pngase hielo en la articulacin: ? Ponga el hielo en una bolsa plstica. ? Coloque una tGenuine Partspiel y la bolsa de hielo. ? Coloque el hielo durante 265mutos, 2 a 3veces por da.  Concurra a todas las visitas de control como se lo haya indicado el mdico. SOLICITE AYUDA SI:  El dolor  empeora.  Tiene fiebre. SOLICITE AYUDA DE INMEDIATO SI:  Siente un dolor muy intenso en la articulacin.  Se le hincha la articulacin.  La articulacin est enrojecida.  Siente dolor en muchas articulaciones y se le hinchan.  Siente un dolor muy intenso en la espalda.  Tiene la pierna muy dbil.  No puede controlar la miccin (orina) o la defecacin (materia fecal). Esta informacin no tiene coMarine scientistl consejo del mdico. Asegrese de hacerle al mdico cualquier pregunta que tenga. Document Released: 02/10/2012 Document Revised: 12/03/2015 Document Reviewed: 11/06/2014 Elsevier Interactive Patient Education  2018 [employer]    IF you received an x-ray today, you will receive an invoice from [facility]. Please contact [facility] at [phone] questions or concerns regarding your invoice.   IF you received labwork today, you will receive an invoice from [facility] at [phone] questions or concerns regarding your invoice.   Our billing staff will not be able to assist you with questions regarding bills from these companies.  You will be contacted with the lab results as soon as they are available. The fastest way to get your results is to activate your My Chart account. Instructions are located on the last page of this paperwork. If you have not heard from usKoreaegarding the results in 2 weeks, please contact this office.

## 2017-06-17 NOTE — Progress Notes (Signed)
Autumn Haley 51 y.o.   Chief Complaint  Patient presents with  . Hand Pain    BOTH and RIGHT 5th finger not straight    HISTORY OF PRESENT ILLNESS: This is a 51 y.o. female complaining of pain and deformity to 5th fingers both hands. Denies injury.  HPI   Prior to Admission medications   Medication Sig Start Date End Date Taking? Authorizing Provider  Calcium Carbonate-Vit D-Min (CALTRATE PLUS PO) Take by mouth.   Yes [provider]  diclofenac (VOLTAREN) 75 MG EC tablet Take 75 mg by mouth 2 (two) times daily.   Yes [provider]  meloxicam (MOBIC) 15 MG tablet Take 0.5-1 tablets (7.5-15 mg total) by mouth daily. 04/10/17  Yes Horald Pollen, MD  medroxyPROGESTERone (DEPO-PROVERA) 150 MG/ML injection INJECT 1ML INTO THE MUSCLE EVERY 3 MONTHS Patient not taking: Reported on 12/06/2015 05/04/15   Terrance Mass, MD  predniSONE (DELTASONE) 20 MG tablet Two daily with food Patient not taking: Reported on 12/06/2015 08/15/15   Robyn Haber, MD  Prenatal Vit-Fe Fumarate-FA (CLASSIC PRENATAL) 28-0.8 MG TABS Take 1 tablet by mouth daily. Patient not taking: Reported on 12/08/2016 08/01/15   Robyn Haber, MD  sertraline (ZOLOFT) 50 MG tablet Take 1 tablet (50 mg total) by mouth daily. Patient not taking: Reported on 02/23/2017 12/06/15   Terrance Mass, MD  Vitamin D, Ergocalciferol, (DRISDOL) 50000 units CAPS capsule Take 1 capsule (50,000 Units total) by mouth every 7 (seven) days. Patient not taking: Reported on 06/17/2017 02/11/17   Horald Pollen, MD    Allergies  Allergen Reactions  . Levaquin [Levofloxacin] Rash    Patient Active Problem List   Diagnosis Date Noted  . Arthralgia 02/23/2017  . Vitamin D deficiency 02/11/2017  . Hypocalcemia 02/11/2017  . Renal dysfunction 07/26/2012  . Fibroid uterus 12/26/2011    Past Medical History:  Diagnosis Date  . Arthritis   . Chronic kidney disease    right kidney  . Fibroid    FIBROID UTERUS  . NSVD (normal spontaneous vaginal delivery)    X 2  . NSVD (normal spontaneous vaginal delivery)    X 2  . SAB (spontaneous abortion)   . SAB (spontaneous abortion)   . Vitamin D deficiency     No past surgical history on file.  Social History   Social History  . Marital status: Married    Spouse name: Donnie Aho  . Number of children: 2  . Years of education: 12   Occupational History  . 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 Smoking status: Never Smoker  . Smokeless tobacco: Never Used  . Alcohol use No     Comment: OCCASIONALLY  . Drug use: No  . Sexual activity: Not Currently    Partners: Male    Birth control/ protection: Rhythm   Other Topics Concern  . Not on file   Social History Narrative   ** Merged History Encounter **       From [place], [place].  Lives here with her husband and 2 children.    No family history on file.   Review of Systems  Constitutional: Negative.  Negative for chills, fever and weight loss.  HENT: Negative for nosebleeds and sore throat.   Eyes: Negative for blurred vision, double vision, discharge and redness.  Respiratory: Negative for cough and shortness of breath.   Cardiovascular: Negative for chest pain, palpitations and leg swelling.  Gastrointestinal:  Negative for abdominal pain, diarrhea, nausea and vomiting.  Genitourinary: Negative for dysuria and hematuria.  Musculoskeletal: Positive for joint pain.  Skin: Negative for rash.  Neurological: Negative.  Negative for dizziness and headaches.  Endo/Heme/Allergies: Negative.   All other systems reviewed and are negative.  Vitals:   06/17/17 1002  BP: (!) 160/90  Pulse: 64  Resp: 16  Temp: 97.8 F (36.6 C)  SpO2: 97%     Physical Exam  Constitutional: She is oriented to person, place, and time. She appears well-developed and well-nourished.  HENT:  Head: Normocephalic and atraumatic.  Nose: Nose  normal.  Mouth/Throat: Oropharynx is clear and moist.  Eyes: Pupils are equal, round, and reactive to light. Conjunctivae and EOM are normal.  Neck: Normal range of motion. Neck supple. No thyromegaly present.  Cardiovascular: Normal rate, regular rhythm and normal heart sounds.   Pulmonary/Chest: Effort normal and breath sounds normal.  Abdominal: Soft. Bowel sounds are normal.  Musculoskeletal:  Hands: 5th fingers: distal deformities R>L c/w osteoarthritis; mild swelling and erythema; NVI  Lymphadenopathy:    She has no cervical adenopathy.  Neurological: She is alert and oriented to person, place, and time. No sensory deficit. She exhibits normal muscle tone.  Skin: Skin is warm and dry. Capillary refill takes less than 2 seconds.  Psychiatric: She has a normal mood and affect. Her behavior is normal.  Vitals reviewed.   Dg Hand Complete Right  Result Date: 06/17/2017 CLINICAL DATA:  Pain in the finger of both hands.  Arthritis. EXAM: RIGHT HAND - COMPLETE 3+ VIEW COMPARISON:  Right wrist radiographs 10/10/2013 FINDINGS: Focal degenerative changes are present in the DIP joint of the fifth digit. No other focal arthritic changes are present. There is surrounding soft tissue swelling as well. IMPRESSION: 1. Focal degenerative changes of the DIP joint in the fifth digit with surrounding soft tissue swelling. This likely represents ulcer arthritis with secondary inflammation. Electronically Signed   By: San Morelle M.D.   On: 06/17/2017 10:38    ASSESSMENT & PLAN: [name] was seen today for hand pain.  Diagnoses and all orders for this visit:  Pain in finger of both hands -     DG Hand Complete Right; Future -     Ambulatory referral to Rheumatology  Osteoarthritis of fingers of hands, bilateral -     Ambulatory referral to Rheumatology    Patient Instructions   Continue taking Voltaren. Follow up with Rheumatologist.  Artritis (Arthritis) El significado del  trmino artritis es dolor de las articulaciones. Tambin puede significar enfermedad articular. Una articulacin es TEFL teacher de unin de [place]. Las personas que sufren artritis pueden tener lo siguiente:  Enrojecimiento de las articulaciones.  Inflamaciones articulares.  Articulaciones rgidas.  Articulaciones calientes.  Cristy Hilts.  Sensacin de estar enfermo. CUIDADOS EN EL HOGAR Est atento a cualquier cambio en los sntomas. Tome estas medidas para Best boy y la hinchazn. Medicamentos  [employer] de venta libre y los recetados solamente como se lo haya indicado el mdico.  No tome aspirina para Best boy si el mdico le dice que puede tener gota. Actividades  Ponga la articulacin en reposo si el mdico se lo indica.  Evite las actividades que intensifiquen Conservation officer, historic buildings.  Haga ejercicios con la articulacin como se lo haya indicado el mdico. Intente hacer ejercicios tales como: ? Natacin. ? Gimnasia acutica. ? Andar en bicicleta. ? Caminar. Cuidado de la articulacin  Si la articulacin est hinchada, mantngala elevada  si el mdico se lo indic.  Si al despertar por la maana, nota que la articulacin est rgida, intente tomar una ducha con agua tibia.  Si es diabtico, no se ponga calor sin consultarle al mdico.  Si se lo indican, pngase calor en la articulacin: ? Coloque una toalla entre la articulacin y la compresa caliente o la almohadilla trmica. ? Coloque el calor en la zona durante 20 o 25mnutos.  Si se lo indican, pngase hielo en la articulacin: ? Ponga el hielo en una bolsa plstica. ? Coloque una tGenuine Partspiel y la bolsa de hielo. ? Coloque el hielo durante 269mutos, 2 a 3veces por da.  Concurra a todas las visitas de control como se lo haya indicado el mdico. SOLICITE AYUDA SI:  El dolor empeora.  Tiene fiebre. SOLICITE AYUDA DE INMEDIATO SI:  Siente un dolor muy intenso en la articulacin.  Se  le hincha la articulacin.  La articulacin est enrojecida.  Siente dolor en muchas articulaciones y se le hinchan.  Siente un dolor muy intenso en la espalda.  Tiene la pierna muy dbil.  No puede controlar la miccin (orina) o la defecacin (materia fecal). Esta informacin no tiene coMarine scientistl consejo del mdico. Asegrese de hacerle al mdico cualquier pregunta que tenga. Document Released: 02/10/2012 Document Revised: 12/03/2015 Document Reviewed: 11/06/2014 Elsevier Interactive Patient Education  2018 [employer]    IF you received an x-ray today, you will receive an invoice from [facility]. Please contact [facility] at [phone] questions or concerns regarding your invoice.   IF you received labwork today, you will receive an invoice from [facility] at [phone] questions or concerns regarding your invoice.   Our billing staff will not be able to assist you with questions regarding bills from these companies.  You will be contacted with the lab results as soon as they are available. The fastest way to get your results is to activate your My Chart account. Instructions are located on the last page of this paperwork. If you have not heard from usKoreaegarding the results in 2 weeks, please contact this office.         MiAgustina CaroliMD [facility]

## 2017-07-06 DIAGNOSIS — M79641 Pain in right hand: Secondary | ICD-10-CM | POA: Diagnosis not present

## 2017-07-06 DIAGNOSIS — M25561 Pain in right knee: Secondary | ICD-10-CM | POA: Diagnosis not present

## 2017-07-06 DIAGNOSIS — M79671 Pain in right foot: Secondary | ICD-10-CM | POA: Diagnosis not present

## 2017-07-06 DIAGNOSIS — M25569 Pain in unspecified knee: Secondary | ICD-10-CM | POA: Diagnosis not present

## 2017-07-06 DIAGNOSIS — M1990 Unspecified osteoarthritis, unspecified site: Secondary | ICD-10-CM | POA: Diagnosis not present

## 2017-07-06 DIAGNOSIS — M7989 Other specified soft tissue disorders: Secondary | ICD-10-CM | POA: Diagnosis not present

## 2017-07-06 DIAGNOSIS — M79672 Pain in left foot: Secondary | ICD-10-CM | POA: Diagnosis not present

## 2017-07-06 DIAGNOSIS — M79673 Pain in unspecified foot: Secondary | ICD-10-CM | POA: Diagnosis not present

## 2017-07-06 DIAGNOSIS — L609 Nail disorder, unspecified: Secondary | ICD-10-CM | POA: Diagnosis not present

## 2017-07-06 DIAGNOSIS — M79642 Pain in left hand: Secondary | ICD-10-CM | POA: Diagnosis not present

## 2017-07-06 DIAGNOSIS — M79643 Pain in unspecified hand: Secondary | ICD-10-CM | POA: Diagnosis not present

## 2017-07-06 DIAGNOSIS — M25562 Pain in left knee: Secondary | ICD-10-CM | POA: Diagnosis not present

## 2017-07-22 DIAGNOSIS — M1990 Unspecified osteoarthritis, unspecified site: Secondary | ICD-10-CM | POA: Diagnosis not present

## 2017-07-22 DIAGNOSIS — M7989 Other specified soft tissue disorders: Secondary | ICD-10-CM | POA: Diagnosis not present

## 2017-07-22 DIAGNOSIS — M79643 Pain in unspecified hand: Secondary | ICD-10-CM | POA: Diagnosis not present

## 2017-07-22 DIAGNOSIS — L609 Nail disorder, unspecified: Secondary | ICD-10-CM | POA: Diagnosis not present

## 2017-11-20 ENCOUNTER — Other Ambulatory Visit: Payer: Self-pay

## 2017-11-20 ENCOUNTER — Encounter: Payer: Self-pay | Admitting: Family Medicine

## 2017-11-20 ENCOUNTER — Ambulatory Visit: Payer: BLUE CROSS/BLUE SHIELD | Admitting: Family Medicine

## 2017-11-20 VITALS — BP 138/74 | HR 88 | Temp 98.1°F | Resp 16 | Ht 60.24 in | Wt 131.0 lb

## 2017-11-20 DIAGNOSIS — M130 Polyarthritis, unspecified: Secondary | ICD-10-CM

## 2017-11-20 NOTE — Progress Notes (Signed)
3/29/20193:27 PM  Darina Hartwell Aug 04, 1966, 52 y.o. female [id number]  Chief Complaint  Patient presents with  . Knee Pain    mobic not helping pain at all     HPI:   Patient is a 52 y.o. female who presents today for frustration with the medical system regarding on going multiple joint pains. She has been under the care of rheum. Was started on plaquenil in oct, states medication is not helping with her joint pain, she does not know what she has. She does have an appt tomorrow.  Upon chart review, patient also has had hypocalcemia of unclear etiology, was referred to endo in June 2018. Patient never heard from that referral. She reports normal intake of calcium, she drinks milk daily, eats variety of diary products. She denies muscle spasms, twitches.   Knee xrays Left 2016 and Right 2014 - normal Right hand xray - Focal degenerative changes of the DIP joint in the fifth digit with surrounding soft tissue swelling. This likely represents ulcer arthritis with secondary inflammation.  Plaquenil 268m BID, Dr GLahoma Rocker Depression screen [facility]2/9 11/20/2017 06/17/2017 05/04/2017  Decreased Interest 0 0 0  Down, Depressed, Hopeless 0 0 0  PHQ - 2 Score 0 0 0  Altered sleeping - - -  Tired, decreased energy - - -  Change in appetite - - -  Feeling bad or failure about yourself  - - -  Trouble concentrating - - -  Moving slowly or fidgety/restless - - -  Suicidal thoughts - - -  PHQ-9 Score - - -    Allergies  Allergen Reactions  . Levaquin [Levofloxacin] Rash    Prior to Admission medications   Medication Sig Start Date End Date Taking? Authorizing Provider  hydroxychloroquine (PLAQUENIL) 200 MG tablet Take by mouth 2 (two) times daily.   Yes [provider]    Past Medical History:  Diagnosis Date  . Arthritis   . Chronic kidney disease    right kidney  . Fibroid    FIBROID UTERUS  . NSVD (normal spontaneous vaginal delivery)    X 2  . NSVD  (normal spontaneous vaginal delivery)    X 2  . SAB (spontaneous abortion)   . SAB (spontaneous abortion)   . Vitamin D deficiency     History reviewed. No pertinent surgical history.  Social History   Tobacco Use  . Smoking status: Never Smoker  . Smokeless tobacco: Never Used  Substance Use Topics  . Alcohol use: No    Comment: OCCASIONALLY    History reviewed. No pertinent family history.  ROS Per hpi  OBJECTIVE:  Blood pressure 138/74, pulse 88, temperature 98.1 F (36.7 C), resp. rate 16, height 5' 0.24" (1.53 m), weight 131 lb (59.4 kg), last menstrual period 02/19/2017, SpO2 98 %.  Physical Exam  Constitutional: She is oriented to person, place, and time and well-developed, well-nourished, and in no distress.  HENT:  Head: Normocephalic and atraumatic.  Mouth/Throat: Mucous membranes are normal.  Eyes: Pupils are equal, round, and reactive to light. EOM are normal. No scleral icterus.  Neck: Neck supple.  Pulmonary/Chest: Effort normal.  Neurological: She is alert and oriented to person, place, and time. Gait normal.  Skin: Skin is warm and dry.  Psychiatric: Mood and affect normal.  Nursing note and vitals reviewed.    ASSESSMENT and PLAN  1. Polyarthritis More than 50% of this 25 minute visit was spent in education and coordination of care. I explained  to patient that is seems she has an inflammatory arthritis per xrays however unable to provide more details given we do not have access to rheum records. I strongly encouraged patient to keep tomorrows appt, and request an interpreter. I also informed patient that diagnosis are made based on information obtained for several sources and that it can take time to get the correct diagnosis and treatment. I encouraged her to work with her rheumatologist on these issues. In the meanwhile, we will request records.   2. Hypocalcemia Last labs done almost a year ago, will repeat again, if present will refer to endo fur  further evaluation.  - Comprehensive metabolic panel - Phosphorus - Magnesium - PTH, Intact and Calcium - Vitamin D, 25-hydroxy   Return in about 1 month (around 12/18/2017).    Rutherford Guys, MD Primary Care at [place], [place] [address] Ph.  [phone] Fax [phone]

## 2017-11-20 NOTE — Patient Instructions (Signed)
     IF you received an x-ray today, you will receive an invoice from [facility]. Please contact [facility] at [phone] with questions or concerns regarding your invoice.   IF you received labwork today, you will receive an invoice from [facility]. Please contact [facility] at [phone] with questions or concerns regarding your invoice.   Our billing staff will not be able to assist you with questions regarding bills from these companies.  You will be contacted with the lab results as soon as they are available. The fastest way to get your results is to activate your My Chart account. Instructions are located on the last page of this paperwork. If you have not heard from us regarding the results in 2 weeks, please contact this office.     

## 2017-11-21 LAB — COMPREHENSIVE METABOLIC PANEL
ALT: 18 IU/L (ref 0–32)
AST: 22 IU/L (ref 0–40)
Albumin/Globulin Ratio: 1.5 (ref 1.2–2.2)
Albumin: 4.1 g/dL (ref 3.5–5.5)
Alkaline Phosphatase: 56 IU/L (ref 39–117)
BUN/Creatinine Ratio: 23 (ref 9–23)
BUN: 13 mg/dL (ref 6–24)
Bilirubin Total: 0.3 mg/dL (ref 0.0–1.2)
CO2: 25 mmol/L (ref 20–29)
Calcium: 8.6 mg/dL — ABNORMAL LOW (ref 8.7–10.2)
Chloride: 105 mmol/L (ref 96–106)
Creatinine, Ser: 0.57 mg/dL (ref 0.57–1.00)
GFR calc Af Amer: 126 mL/min/{1.73_m2} (ref >59)
GFR calc non Af Amer: 109 mL/min/{1.73_m2} (ref >59)
Globulin, Total: 2.7 g/dL (ref 1.5–4.5)
Glucose: 80 mg/dL (ref 65–99)
Potassium: 4.2 mmol/L (ref 3.5–5.2)
Sodium: 143 mmol/L (ref 134–144)
Total Protein: 6.8 g/dL (ref 6.0–8.5)

## 2017-11-21 LAB — PTH, INTACT AND CALCIUM: PTH: 38 pg/mL (ref 15–65)

## 2017-11-21 LAB — VITAMIN D 25 HYDROXY (VIT D DEFICIENCY, FRACTURES): Vit D, 25-Hydroxy: 40.8 ng/mL (ref 30.0–100.0)

## 2017-11-21 LAB — PHOSPHORUS: Phosphorus: 3.6 mg/dL (ref 2.5–4.5)

## 2017-11-21 LAB — MAGNESIUM: Magnesium: 2.1 mg/dL (ref 1.6–2.3)

## 2017-11-23 DIAGNOSIS — M25562 Pain in left knee: Secondary | ICD-10-CM | POA: Diagnosis not present

## 2017-11-23 DIAGNOSIS — M1990 Unspecified osteoarthritis, unspecified site: Secondary | ICD-10-CM | POA: Diagnosis not present

## 2017-11-23 DIAGNOSIS — L609 Nail disorder, unspecified: Secondary | ICD-10-CM | POA: Diagnosis not present

## 2017-11-23 DIAGNOSIS — M79643 Pain in unspecified hand: Secondary | ICD-10-CM | POA: Diagnosis not present

## 2017-11-23 DIAGNOSIS — M7989 Other specified soft tissue disorders: Secondary | ICD-10-CM | POA: Diagnosis not present

## 2017-12-08 NOTE — Addendum Note (Signed)
Addended by: Rutherford Guys on: 12/08/2017 09:19 AM   Modules accepted: Orders

## 2017-12-09 ENCOUNTER — Encounter: Payer: BLUE CROSS/BLUE SHIELD | Admitting: Obstetrics & Gynecology

## 2017-12-14 ENCOUNTER — Telehealth: Payer: Self-pay | Admitting: Family Medicine

## 2017-12-14 NOTE — Telephone Encounter (Signed)
Copied from [place] [id number]. Topic: Quick Communication - Lab Results >> Dec 14, 2017 11:23 AM Rodolph Bong, RN wrote: Called patient to inform them of lab results. When patient returns call, triage nurse may disclose results.  Notes recorded by Rutherford Guys, MD on 12/08/2017 at 9:18 AM EDT Hello please let her know that her calcium continues to be low despite normalization of vitamin D. She needs to see endocrinology to figure out and treat her low calcium. I have made a referral, if she does not hear from endocrinology for an appt in after 2 week, she should please call us and let us know. thanks

## 2017-12-14 NOTE — Telephone Encounter (Signed)
Reviewed lab results and physician's note with patient. She will look for a call from Endocrinology within the next 2 weeks.

## 2017-12-17 ENCOUNTER — Encounter: Payer: BLUE CROSS/BLUE SHIELD | Admitting: Obstetrics & Gynecology

## 2017-12-24 ENCOUNTER — Ambulatory Visit: Payer: Self-pay | Admitting: Internal Medicine

## 2018-01-01 ENCOUNTER — Encounter: Payer: Self-pay | Admitting: Internal Medicine

## 2018-01-01 ENCOUNTER — Ambulatory Visit: Payer: BLUE CROSS/BLUE SHIELD | Admitting: Internal Medicine

## 2018-01-01 DIAGNOSIS — E559 Vitamin D deficiency, unspecified: Secondary | ICD-10-CM | POA: Diagnosis not present

## 2018-01-01 NOTE — Patient Instructions (Addendum)
Please stop at the lab.  Please come back for a follow-up appointment if the results are normal.   Patient information (Up-to-Date): Collection of a 24-hour urine specimen  - You should collect every drop of urine during each 24-hour period. It does not matter how much or little urine is passed each time, as long as every drop is collected. - Begin the urine collection in the morning after you wake up, after you have emptied your bladder for the first time. - Urinate (empty the bladder) for the first time and flush it down the toilet. Note the exact time (eg, 6:15 AM). You will begin the urine collection at this time. - Collect every drop of urine during the day and night in an empty collection bottle. Store the bottle at room temperature or in the refrigerator. - If you need to have a bowel movement, any urine passed with the bowel movement should be collected. Try not to include feces with the urine collection. If feces does get mixed in, do not try to remove the feces from the urine collection bottle. - Finish by collecting the first urine passed the next morning, adding it to the collection bottle. This should be within ten minutes before or after the time of the first morning void on the first day (which was flushed). In this example, you would try to void between 6:05 and 6:25 on the second day. - If you need to urinate one hour before the final collection time, drink a full glass of water so that you can void again at the appropriate time. If you have to urinate 20 minutes before, try to hold the urine until the proper time. - Please note the exact time of the final collection, even if it is not the same time as when collection began on day 1. - The bottle(s) may be kept at room temperature for a day or two, but should be kept cool or refrigerated for longer periods of time.   Hipocalcemia en los adultos (Hypocalcemia, Adult) La hipocalcemia ocurre cuando el nivel de calcio de una persona  est por debajo de lo normal. El calcio es un mineral que el organismo utiliza de muchas formas. La falta de calcio puede afectar el corazn y los msculos, hacer que los huesos sean ms propensos a las fracturas y causar otros problemas. CAUSAS Esta afeccin puede ser causada por lo siguiente:  Disminucin de la produccin (hipoparatiroidismo) o uso inadecuado de la hormona paratiroidea.  Problemas o la extirpacin [place] de las glndulas paratiroideas.  Problemas con la funcin paratiroidea despus de la extirpacin de la glndula tiroides.  Falta (deficiencia) de vitamina D, de magnesio o ambos.  Problemas renales. Algunas causas menos frecuentes incluyen lo siguiente:  Problemas intestinales que interfieren con la absorcin de nutrientes.  Alcoholismo.  Bajos niveles de una protena del organismo llamada albmina.  Inflamacin del pncreas (pancreatitis).  Algunos medicamentos.  Infecciones graves (sepsis).  Ciertas enfermedades, como sarcoidosis o hemocromatosis, que hacen que las glndulas paratiroideas se llenen de clulas o sustancias que normalmente no estn presentes.  Descomposicin de grandes cantidades de fibra muscular.  Altos niveles de fosfatos en el cuerpo.  Cncer.  Transfusiones de [employer] que normalmente ocurren en casos de traumatismos graves. SNTOMAS Los sntomas de esta afeccin incluyen lo siguiente:  Entumecimiento y hormigueo de los dedos de los pies o alrededor de Equities trader.  Dolores o [employer], especialmente en las piernas, los pies y la espalda.  Espasmos musculares.  Clicos o dolor en el abdomen.  Problemas de memoria, confusin o dificultad para pensar.  Depresin, ansiedad, irritabilidad o cambios en la personalidad.  Desmayos.  Dolor en el pecho.  Dificultad para tragar.  Cambios en el sonido de la voz.  Dificultad para respirar o sibilancias.  Fatiga y debilidad general. Los sntomas de la  hipocalcemia grave incluyen lo siguiente:  Sacudidas incontrolables (convulsiones).  Reflejo exagerado y prolongado de cierre gltico (laringoespasmo).  Latidos cardacos rpidos (palpitaciones) y ritmo cardaco anormal (arritmias). Los sntomas a largo plazo de esta afeccin incluyen lo siguiente:  Herold Harms y uas gruesos y Publishing rights manager.  Piel seca o enfermedades de la piel (psoriasis, eczema, o dermatitis) de larga duracin (crnicas).  Opacidad en el cristalino del ojo (cataratas). DIAGNSTICO Por lo general, este trastorno se diagnostica mediante un anlisis de [place]. Tambin se pueden hacer otros estudios para ayudar a Office manager causa preexistente de la afeccin. Por ejemplo, se puede realizar un estudio que registra la actividad elctrica del corazn (electrocardiograma o ECG). TRATAMIENTO El tratamiento de esta afeccin puede incluir lo siguiente:  Calcio administrado por boca (va oral) o de una va intravenosa que se coloca en una vena. El mtodo utilizado para la administracin del calcio depender de la gravedad de la afeccin.  Otros minerales (electrolitos), [employer]. [place] de otros tratamientos depender de la causa de la afeccin. INSTRUCCIONES PARA EL CUIDADO EN EL HOGAR  Siga las indicaciones del mdico o nutricionista en lo que respecta a la dieta.  Tome los suplementos solamente como se lo haya indicado el mdico.  Concurra a todas las visitas de control como se lo haya indicado el mdico. Esto es importante. SOLICITE ATENCIN MDICA SI:  Aumenta la fatiga.  Aumentan los espasmos musculares.  Observa una nueva hinchazn en los pies, tobillos o piernas.  Sufre cambios en el estado de nimo, en la memoria o en la personalidad. SOLICITE ATENCIN MDICA DE INMEDIATO SI:  Siente dolor en el pecho.  Siente latidos cardacos rpidos o irregulares de [employer].  Tiene dificultad para respirar.  Se desmaya.  Comienza a tener  convulsiones.  Se siente confundido. Esta informacin no tiene Marine scientist el consejo del mdico. Asegrese de hacerle al mdico cualquier pregunta que tenga. Document Released: 07/28/2012 Document Revised: 12/03/2015 Document Reviewed: 12/27/2014 Elsevier Interactive Patient Education  2018 [employer].

## 2018-01-01 NOTE — Progress Notes (Signed)
Patient ID: Autumn Haley, female   DOB: 09-03-1965, 52 y.o.   MRN: [id number]    HPI  Autumn Haley is a 52 y.o.-year-old female, referred by her PCP, Dr. Mitchel Honour, for evaluation for hypocalcemia.  Pt was dx with hypocalcemia ~3 years ago (by her [place]). Per my records, the first occurrence was in 11/2016.  No history of anterior neck surgery.  I reviewed pt's pertinent labs: Lab Results  Component Value Date   PTH 38 11/20/2017   PTH Comment 11/20/2017   PTH CANCELED 02/11/2017   CALCIUM 8.6 (L) 11/20/2017   CALCIUM 8.5 (L) 02/11/2017   CALCIUM 7.9 (L) 01/15/2017   CALCIUM 8.0 (L) 12/15/2016   CALCIUM 8.0 (L) 12/08/2016   CALCIUM 8.7 12/06/2015   CALCIUM 8.5 11/06/2014   CALCIUM 8.7 11/03/2013   CALCIUM 8.6 11/22/2012   CALCIUM 8.5 01/17/2012   No history of osteoporosis. No fractures or falls.   No hand cramping or perioral numbness.  She does have a history of polyarthralgia for which she is seeing rheumatology.  She is on Plaquenil.  No h/o kidney stones.  No h/o malabsorption, celiac disease, or h/o GBP.  She does have a h/o vitamin D deficiency, however, latest vitamin D levels were normal: Lab Results  Component Value Date   VD25OH 40.8 11/20/2017   VD25OH 53.6 02/11/2017   VD25OH 29 (L) 12/08/2016   VD25OH 33 12/06/2015   VD25OH 27 (L) 05/04/2015   VD25OH 28 (L) 11/06/2014   VD25OH 22 (L) 11/03/2013   Magnesium, phosphorus, and calcitriol levels were normal: Component     Latest Ref Rng & Units 02/09/2014 02/11/2017 11/20/2017  Vitamin D 1, 25 (OH) Total     18 - 72 pg/mL 72    Vitamin D3 1, 25 (OH)     pg/mL 20    Vitamin D2 1, 25 (OH)     pg/mL 52    Phosphorus     2.5 - 4.5 mg/dL  4.0 3.6  Magnesium     1.6 - 2.3 mg/dL  2.1 2.1   Pt is not on calcium but she is taking vitamin D 2000 units daily.  She is not on the restrictive diet, she eats dairy and green, leafy, vegetables.    No h/o CKD. Last BUN/Cr: Lab Results  Component Value  Date   BUN 13 11/20/2017   CREATININE 0.57 11/20/2017   An HIV test was negative in 2017.  No increased use of Fluorine - she drinks bottled water. She denies history of radiation therapy. No h/o heavy alcohol use or pancreatitis. No history of Wilson disease, hemochromatosis, metastatic cancer, granulomatous diseases.  Pt does not have a FH of hypocalcemia.  ROS: Constitutional: no weight gain/loss, no fatigue, no subjective hyperthermia/hypothermia Eyes: no blurry vision, no xerophthalmia ENT: no sore throat, no nodules palpated in throat, no dysphagia/odynophagia, no hoarseness  Cardiovascular: no CP/SOB/palpitations/leg swelling Respiratory: no cough/SOB Gastrointestinal: no N/V/D/C Musculoskeletal: no muscle/+ joint aches Skin: no rashes Neurological: no tremors/numbness/tingling/dizziness Psychiatric: no anxiety/depression  Past Medical History:  Diagnosis Date  . Arthritis   . Chronic kidney disease    right kidney  . Fibroid    FIBROID UTERUS  . NSVD (normal spontaneous vaginal delivery)    X 2  . NSVD (normal spontaneous vaginal delivery)    X 2  . SAB (spontaneous abortion)   . SAB (spontaneous abortion)   . Vitamin D deficiency    History reviewed. No pertinent surgical history. Social History   Socioeconomic  History  . Marital status: Married    Spouse name: Donnie Aho  . Number of children: 2  . Years of education: 32  . Highest education level: Not on file  Occupational History  . 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 Financial resource strain: Not on file  . Food insecurity:    Worry: Not on file    Inability: Not on file  . Transportation needs:    Medical: Not on file    Non-medical: Not on file  Tobacco Use  . Smoking status: Never Smoker  . Smokeless tobacco: Never Used  Substance and Sexual Activity  . Alcohol use: No    Comment: OCCASIONALLY  . Drug use: No  . Sexual  activity: Not Currently    Partners: Male    Birth control/protection: Rhythm  Lifestyle  . Physical activity:    Days per week: Not on file    Minutes per session: Not on file  . Stress: Not on file  Relationships  . Social connections:    Talks on phone: Not on file    Gets together: Not on file    Attends religious service: Not on file    Active member of club or organization: Not on file    Attends meetings of clubs or organizations: Not on file    Relationship status: Not on file  . Intimate partner violence:    Fear of current or ex partner: Not on file    Emotionally abused: Not on file    Physically abused: Not on file    Forced sexual activity: Not on file  Other Topics Concern  . Not on file  Social History Narrative   ** Merged History Encounter **       From [place], [place].  Lives here with her husband and 2 children.   Current Outpatient Medications on File Prior to Visit  Medication Sig Dispense Refill  . hydroxychloroquine (PLAQUENIL) 200 MG tablet Take by mouth 2 (two) times daily.     No current facility-administered medications on file prior to visit.    Allergies  Allergen Reactions  . Levaquin [Levofloxacin] Rash   History reviewed. No pertinent family history.  PE: BP (!) 160/90 (BP Location: Left Arm, Patient Position: Sitting, Cuff Size: Normal)   Pulse 62   Ht 5' 0.5" (1.537 m)   Wt 137 lb (62.1 kg)   LMP 02/19/2017   SpO2 98%   BMI 26.32 kg/m  Wt Readings from Last 3 Encounters:  01/01/18 137 lb (62.1 kg)  11/20/17 131 lb (59.4 kg)  06/17/17 137 lb 3.2 oz (62.2 kg)   Constitutional: Normal weight, in NAD Eyes: PERRLA, EOMI, no exophthalmos ENT: moist mucous membranes, no thyromegaly, no cervical lymphadenopathy Cardiovascular: RRR, No MRG Respiratory: CTA B Gastrointestinal: abdomen soft, NT, ND, BS+ Musculoskeletal: no deformities, strength intact in all 4 Skin: moist, warm, no rashes Neurological: no tremor with outstretched  hands, DTR normal in all 4, + Chvostek sign negative B  Assessment: 1. Hypocalcemia  PLAN: 1. Patient has had low calcium levels over the last year, previously normal levels, but in the low normal range. Lowest level being at 7.9 (uncorrected). A recent intact PTH level was inappropriately normal, at 38.  She does have a history of vitamin D deficiency, but not at the time of the PTH check. - No apparent signs/sxs from hypocalcemia: no perioral numbness, no acral cramping; Chvostek sign negative. -  I discussed with the patient about the physiology of calcium and parathyroid hormone and I explained that in the setting of a low calcium, it is expected that PTH increases, so the fact that her PTH is inappropriately normal, may indicate a problem with the parathyroid glands.  However, the ideal way to look at the calcium level is through the ionized calcium test -  we will check this today.  We did review the previous investigation that showed repeatedly normal magnesium and phosphorus levels, and normal active vitamin D level (calcitriol).  Possible etiologies of real hypocalcemia in the setting of a PTH lower than normal are:  Postoperative-she denies anterior neck surgery  Autoimmune (stimulating antibodies of the calcium sensing receptor -which can be either autosomal dominant or sporadic) -we will check her urinary calcium, which should be normal or increased in this case   Hypomagnesemia -previous magnesium levels have been normal, I do not feel we need to repeat this  History of radiation to head or neck-denies  HIV infection- tested negative in 2017  Storage diseases: Wilson disease, hemochromatosis, granulomatous disease, metastatic cancer - no history of these conditions  Rarely, PTH resistance - abnormal intact PTH molecule - I doubt this for her since previous calcium levels have been normal (although all available levels are low-normal) Other causes for hypocalcemia, for example  vitamin D deficiency, vitamin D resistance, malabsorption, CKD, hyperphosphatemia , etc. usually cause an elevated PTH, which patient does not have. -Today we will check: Ionized and total calcium level Repeat intact PTH ([facility]) vitamin D 1,25 HO 24h urinary calcium/creatinine ratio - pt given instructions for urine collection and the jug   GFR -I will wait for the results of the above labs and will discuss with the plan with the patient. If iCa normal >> no further investigation is needed. If iCa slightly low, she may just be at the lower end of the bell curve for calcium levels. No intervention needed. If iCa low, need to start calcium supplement.  - I will see the patient back if labs are abnormal  For results, call daughter: [phone]  Component     Latest Ref Rng & Units 01/01/2018  Glucose     65 - 99 mg/dL 120 (H)  BUN     7 - 25 mg/dL 12  Creatinine     0.50 - 1.10 mg/dL 0.56  GFR, Est Non African American     > OR = 60 mL/min/1.4m 109  GFR, Est African American     > OR = 60 mL/min/1.774m127  BUN/Creatinine Ratio     6 - 22 (calc) NOT APPLICABLE  Sodium     13539 146 mmol/L 140  Potassium     3.5 - 5.3 mmol/L 4.4  Chloride     98 - 110 mmol/L 104  CO2     20 - 32 mmol/L 30  Calcium     8.6 - 10.2 mg/dL 8.6  Vitamin D 1, 25 (OH) Total     18 - 72 pg/mL 41  Vitamin D3 1, 25 (OH)     pg/mL 41  Vitamin D2 1, 25 (OH)     pg/mL <8  PTH, Intact     15 - 65 pg/mL 29  Calcium Ionized     4.8 - 5.6 mg/dL 4.81   Ionized calcium is low normal, as is the total calcium.  Calcitriol and PTH levels are also normal.  Glucose is elevated (nonfasting). I would not suggest  further investigation at this point.  Her calcium levels are most likely in the lower and of the Bell curve.  Philemon Kingdom, MD PhD [facility] Endocrinology

## 2018-01-02 LAB — PARATHYROID HORMONE, INTACT (NO CA): PTH: 29 pg/mL (ref 15–65)

## 2018-01-04 LAB — BASIC METABOLIC PANEL WITH GFR
BUN: 12 mg/dL (ref 7–25)
CALCIUM: 8.6 mg/dL (ref 8.6–10.2)
CO2: 30 mmol/L (ref 20–32)
CREATININE: 0.56 mg/dL (ref 0.50–1.10)
Chloride: 104 mmol/L (ref 98–110)
GFR, EST NON AFRICAN AMERICAN: 109 mL/min/{1.73_m2} (ref >=60)
GFR, Est African American: 127 mL/min/{1.73_m2} (ref >=60)
Glucose, Bld: 120 mg/dL — ABNORMAL HIGH (ref 65–99)
POTASSIUM: 4.4 mmol/L (ref 3.5–5.3)
Sodium: 140 mmol/L (ref 135–146)

## 2018-01-04 LAB — VITAMIN D 1,25 DIHYDROXY
Vitamin D 1, 25 (OH)2 Total: 41 pg/mL (ref 18–72)
Vitamin D2 1, 25 (OH)2: <8 pg/mL
Vitamin D3 1, 25 (OH)2: 41 pg/mL

## 2018-01-04 LAB — CALCIUM, IONIZED: Calcium, Ion: 4.81 mg/dL (ref 4.8–5.6)

## 2018-03-17 ENCOUNTER — Other Ambulatory Visit: Payer: Self-pay

## 2018-03-17 ENCOUNTER — Encounter: Payer: Self-pay | Admitting: Emergency Medicine

## 2018-03-17 ENCOUNTER — Ambulatory Visit: Payer: BLUE CROSS/BLUE SHIELD | Admitting: Emergency Medicine

## 2018-03-17 VITALS — BP 136/77 | HR 62 | Temp 98.3°F | Resp 16 | Ht 60.25 in | Wt 141.8 lb

## 2018-03-17 DIAGNOSIS — G8929 Other chronic pain: Secondary | ICD-10-CM | POA: Insufficient documentation

## 2018-03-17 DIAGNOSIS — M1712 Unilateral primary osteoarthritis, left knee: Secondary | ICD-10-CM | POA: Diagnosis not present

## 2018-03-17 DIAGNOSIS — M25562 Pain in left knee: Secondary | ICD-10-CM | POA: Diagnosis not present

## 2018-03-17 MED ORDER — METHYLPREDNISOLONE ACETATE 80 MG/ML IJ SUSP
80.0000 mg | Freq: Once | INTRAMUSCULAR | Status: AC
  Administered 2018-03-17: 80 mg via INTRAMUSCULAR

## 2018-03-17 MED ORDER — PREDNISONE 20 MG PO TABS: 40.0000 mg | ORAL_TABLET | Freq: Every day | ORAL | 0 refills | Status: AC

## 2018-03-17 NOTE — Progress Notes (Signed)
Autumn Haley 52 y.o.   Chief Complaint  Patient presents with  . Knee Pain    LEFT x 1 week    HISTORY OF PRESENT ILLNESS: This is a 52 y.o. female with history of osteoarthritis and chronic left knee pain complaining of increased pain for the past week.  No new injuries and no new symptomatology.  HPI   Prior to Admission medications   Medication Sig Start Date End Date Taking? Authorizing Provider  hydroxychloroquine (PLAQUENIL) 200 MG tablet Take by mouth 2 (two) times daily.    [provider]    Allergies  Allergen Reactions  . Levaquin [Levofloxacin] Rash    Patient Active Problem List   Diagnosis Date Noted  . Pain in finger of both hands 06/17/2017  . Osteoarthritis of fingers of hands, bilateral 06/17/2017  . Arthralgia 02/23/2017  . Vitamin D deficiency 02/11/2017  . Hypocalcemia 02/11/2017  . Renal dysfunction 07/26/2012  . Fibroid uterus 12/26/2011    Past Medical History:  Diagnosis Date  . Arthritis   . Chronic kidney disease    right kidney  . Fibroid    FIBROID UTERUS  . NSVD (normal spontaneous vaginal delivery)    X 2  . NSVD (normal spontaneous vaginal delivery)    X 2  . SAB (spontaneous abortion)   . SAB (spontaneous abortion)   . Vitamin D deficiency     No past surgical history on file.  Social History   Socioeconomic History  . Marital status: Married    Spouse name: Donnie Aho  . Number of children: 2  . Years of education: 8  . Highest education level: Not on file  Occupational History  . 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 Financial resource strain: Not on file  . Food insecurity:    Worry: Not on file    Inability: Not on file  . Transportation needs:    Medical: Not on file    Non-medical: Not on file  Tobacco Use  . Smoking status: Never Smoker  . Smokeless tobacco: Never Used  Substance and Sexual Activity  . Alcohol use: No   Comment: OCCASIONALLY  . Drug use: No  . Sexual activity: Not Currently    Partners: Male    Birth control/protection: Rhythm  Lifestyle  . Physical activity:    Days per week: Not on file    Minutes per session: Not on file  . Stress: Not on file  Relationships  . Social connections:    Talks on phone: Not on file    Gets together: Not on file    Attends religious service: Not on file    Active member of club or organization: Not on file    Attends meetings of clubs or organizations: Not on file    Relationship status: Not on file  . Intimate partner violence:    Fear of current or ex partner: Not on file    Emotionally abused: Not on file    Physically abused: Not on file    Forced sexual activity: Not on file  Other Topics Concern  . Not on file  Social History Narrative   ** Merged History Encounter **       From [place], [place].  Lives here with her husband and 2 children.    No family history on file.   Review of Systems  Constitutional: Negative.  Negative for chills and fever.  HENT:  Negative.  Negative for sore throat.   Respiratory: Negative.  Negative for cough and shortness of breath.   Cardiovascular: Negative.  Negative for chest pain and palpitations.  Gastrointestinal: Negative.  Negative for abdominal pain, diarrhea, nausea and vomiting.  Genitourinary: Negative.  Negative for hematuria.  Musculoskeletal: Positive for joint pain.  Skin: Negative.  Negative for rash.  Neurological: Negative.  Negative for sensory change and focal weakness.  Endo/Heme/Allergies: Negative.   All other systems reviewed and are negative.   Vitals:   03/17/18 1205  BP: 136/77  Pulse: 62  Resp: 16  Temp: 98.3 F (36.8 C)  SpO2: 97%    Physical Exam  Constitutional: She is oriented to person, place, and time. She appears well-developed and well-nourished.  HENT:  Head: Normocephalic and atraumatic.  Eyes: Pupils are equal, round, and reactive to light. EOM are  normal.  Neck: Normal range of motion. Neck supple.  Cardiovascular: Normal rate and regular rhythm.  Pulmonary/Chest: Effort normal and breath sounds normal.  Musculoskeletal:  Left knee: Mild swelling.  No bruising or significant erythema.  Painful range of motion. Lower extremities: Positive varicose veins, otherwise within normal limits.  Neurological: She is alert and oriented to person, place, and time.  Skin: Skin is warm and dry. Capillary refill takes less than 2 seconds.  Psychiatric: She has a normal mood and affect. Her behavior is normal.  Vitals reviewed.  A total of 25 minutes was spent in the room with the patient, greater than 50% of which was in counseling/coordination of care regarding differential diagnosis including osteoarthritis, management, treatment, medications, need for follow-up.   ASSESSMENT & PLAN: Autumn Haley was seen today for knee pain.  Diagnoses and all orders for this visit:  Chronic pain of left knee  Primary osteoarthritis of left knee -     predniSONE (DELTASONE) 20 MG tablet; Take 2 tablets (40 mg total) by mouth daily with breakfast for 5 days. -     methylPREDNISolone acetate (DEPO-MEDROL) injection 80 mg     Patient Instructions       IF you received an x-ray today, you will receive an invoice from [facility]. Please contact [facility] at [phone] with questions or concerns regarding your invoice.   IF you received labwork today, you will receive an invoice from [facility]. Please contact [facility] at [phone] with questions or concerns regarding your invoice.   Our billing staff will not be able to assist you with questions regarding bills from these companies.  You will be contacted with the lab results as soon as they are available. The fastest way to get your results is to activate your My Chart account. Instructions are located on the last page of this paperwork. If you have not heard from [place] regarding the  results in 2 weeks, please contact this office.     Artrosis Osteoarthritis La artrosis es un tipo de reumatismo articular que afecta el tejido que cubre los extremos de los huesos en las articulaciones (cartlago). El cartlago acta como amortiguador [employer] Company y los ayuda a moverse con suavidad. La artrosis se produce cuando el cartlago de las articulaciones se gasta. A veces, la artrosis se denomina reumatismo articular "por uso y desgaste". La artrosis es la forma ms frecuente de reumatismo articular. A menudo, afecta a las [employer] American. Es una enfermedad que empeora con el tiempo (una enfermedad progresiva). Esta enfermedad afecta con ms frecuencia las articulaciones de:  Los dedos de [employer].  Los dedos de  los pies.  Las caderas.  Las rodillas.  La columna vertebral, incluido el cuello y la zona lumbar.  Cules son las causas? Esta enfermedad es causada por el desgaste del cartlago que cubre los extremos de [place], lo cual tiene relacin con la edad. Qu incrementa el riesgo? Los siguientes factores pueden hacer que usted sea ms propenso a tener esta enfermedad:  Edad avanzada.  Tener exceso de [place] u obesidad.  Uso excesivo de las articulaciones, como en el caso de los [place].  Lesin pasada de Insurance claims handler.  Ciruga pasada en una articulacin.  Antecedentes familiares de artrosis.  Cules son los signos o los sntomas? Los principales sntomas de esta enfermedad son dolor, hinchazn y Advertising account executive. Con el tiempo, la articulacin puede perder su forma. Se pueden desprender pequeos trozos de [employer] o cartlago y flotar dentro de la articulacin, lo cual puede causar ms dolor y Agricultural consultant. Pueden formarse pequeos depsitos de hueso (ostefitos) en los extremos de Water engineer. Otros sntomas pueden incluir lo siguiente:  Una sensacin de chirrido o raspado dentro de la articulacin al moverla.  Sonidos de  chasquido o crujido al [employer].  Los sntomas pueden afectar una o ms articulaciones. La artrosis en una articulacin principal, como la rodilla o la cadera, puede causar dolor al caminar o al realizar ejercicio. Si tiene artrosis IAC/InterActiveCorp, es posible que no pueda agarrar objetos, torcer la mano o controlar pequeos movimientos de las manos y los dedos (motricidad fina). Cmo se diagnostica? Esta enfermedad se puede diagnosticar en funcin de lo siguiente:  Sus antecedentes mdicos.  Un examen fsico.  Sus sntomas.  Radiografas de la(s) articulacin(es) afectada(s).  Anlisis de sangre para descartar otros tipos de reumatismo articular.  Cmo se trata? No hay cura para esta enfermedad, pero el tratamiento puede ayudar a Financial controller y Teacher, English as a foreign language el funcionamiento de Water engineer. Los planes de tratamiento pueden incluir lo siguiente:  Un programa de ejercicios indicado que permita el descanso y el alivio de la articulacin. Puede trabajar con un fisioterapeuta.  Un plan de control del peso.  Tcnicas de [employer]um[employer]ident, como las siguientes: ? Aplicacin de calor y fro en la articulacin. ? Impulsos elctricos aplicados a las terminaciones nerviosas que se encuentran debajo de la piel (neuroestimulacin elctrica transcutnea [TENS]). ? Masajes. ? Ciertos suplementos nutricionales.  Antiiflamatorios no esteroideos o medicamentos recetados para ayudar a Best boy.  Medicamentos para ayudar a Best boy y la inflamacin (corticoesteroides). Estos se pueden administrar por boca (va oral) o mediante una inyeccin.  Dispositivos de [place], como un dispositivo ortopdico, una frula, un guante especial o un bastn.  Ciruga, como: ? Imelda Pillow. Se hace para reposicionar los huesos y Best boy o para retirar los trozos sueltos de hueso y Database administrator. ? Ciruga de reemplazo articular. Es posible que necesite esta ciruga si tiene una artrosis muy  grave (avanzada).  Siga estas instrucciones en su casa: Actividad  Haga descansar las articulaciones afectadas segn las indicaciones del mdico.  No conduzca ni use maquinaria pesada mientras toma analgsicos recetados.  Practique los ejercicios que le indiquen. Es posible que el mdico o el fisioterapeuta le recomienden tipos especficos de ejercicios, tales como: ? Ejercicios de fortalecimiento. Se realizan para fortalecer los msculos que sostienen las articulaciones afectadas por el reumatismo. Pueden realizarse con peso o con bandas para agregar resistencia. ? Actividades Precious Haws. Son ejercicios, como caminar a paso ligero o Materials engineer  acutica, que aumentan la actividad del corazn. ? Actividades de amplitud de movimientos. [place] articulaciones. ? Ejercicios de equilibrio y [place]. Control del dolor, la rigidez y la hinchazn  Si se lo indican, aplique calor en la zona afectada tan frecuentemente como se lo haya indicado el mdico. Use la fuente de calor que el mdico le recomiende, como una compresa de calor hmedo o una almohadilla trmica. ? Si tiene un dispositivo de ayuda que se puede quitar, quteselo segn lo indicado por su mdico. ? Colquese una toalla entre la piel y la fuente de Freight forwarder. Si el mdico le indica que no se quite el dispositivo de H[employer] Inc se Passenger transport manager, coloque una toalla entre el dispositivo de [place] y la fuente de Freight forwarder. ? Aplique el calor durante 20 a 19mnutos. ? Retire la fuente de calor si la piel se le pone de color rojo brillante. Esto es muy importante si no puede sentir el dolor, el calor ni el fro. Puede correr un riesgo mayor de sufrir quemaduras.  Si se lo indican, aplique hielo sobre la articulacin afectada: ? Si tiene un dispositivo de ayuda que se puede quitar, quteselo segn lo indicado por su mdico. ? Ponga el hielo en una bolsa plstica. ? Colquese una tGenuine Partspiel y la bolsa de  hielo. Si el mdico le indica que no se quite el dispositivo de aHCA Incse aplica hielo, coloque una toalla entre el dispositivo de aSaint Helenay la bolsa de hielo. ? Coloque el hielo durante 240mutos, 2 o 3veces por da. Instrucciones generales  ToDelphie venta libre y los recetados solamente como se lo haya indicado el mdico.  Mantenga un peso saludable. Siga las instrucciones de su mdico con respecto al control del peRidgecrestEstas pueden incluir restricciones en la dieta.  No consuma ningn producto que contenga nicotina o tabaco, como cigarrillos y ciPsychologist, sport and exerciseEstos pueden retrasar la consolidacin del huBalmvilleSi necesita ayuda para dejar de fumar, consulte al mdMeadWestvaco Use los dispositivos de ayAmeren Corporatione lo haya indicado el mdico.  Concurra a todas las visitas de control como se lo haya indicado el mdico. Esto es importante. Dnde encontrar ms informacin:  InAir traffic controllere Reumatismo Articular y EnArboriculturistusculoesquelticas y DeInsurance underwriterNWillow Creek Behavioral Healthf Arthritis and Musculoskeletal and Skin Diseases): www.niams.niSouthExposed.esInFort Leonard WoodNaLockheed Martinn Aging): wwhttp://kim-miller.com/Instituto Estadounidense de Reumatologa (AmJobosf Rheumatology): www.rheumatology.org Comunquese con un mdico si:  La piel se pone roja.  Le aparece una erupcin cutnea.  Siente un dolor que emWaldron Tiene fiebre y siente dolor en la articulacin o el msculo. Solicite ayuda de inmediato si:  PiExpress Scripts Pierde el apetito repentinamente.  Tiene sudoracin nocturna. Resumen  La artrosis es un tipo de reumatismo articular que afecta el tejido que cubre los extremos de los huesos en las articulaciones (cartlago).  Esta enfermedad es causada por el desgaste del cartlago que cubre los extremos de loRedlandlo cual tiene relacin con la edad.  Los principales sntomas de esta enfermedad  son dolor, hinchazn y riAdvertising account executive No hay cura para esta enfermedad, pero el tratamiento puede ayudar a coFinancial controller meTeacher, English as a foreign languagel funcionamiento de laWater engineerEsta informacin no tiene coMarine scientistl consejo del mdico. Asegrese de hacerle al mdico cualquier pregunta que tenga. Document Released: 05/21/2005 Document Revised: 07/07/2016 Document Reviewed: 04/18/2013 Elsevier Interactive Patient Education  2018  [place] (Knee Pain) El dolor de rodilla es un problema frecuente y puede tener muchas causas. A menudo desaparece si se siguen las instrucciones del mdico para el cuidado Financial planner. El tratamiento del dolor continuo depender de su causa. Si el dolor persiste, tal vez haya que realizar ms estudios para Midwife, los cuales pueden incluir radiografas u otros estudios de diagnstico por imgenes de la rodilla. [place] los medicamentos solamente como se lo haya indicado el mdico.  Mantenga la rodilla en reposo y en alto (elevada) mientras est descansando.  No haga cosas que le causen dolor o que lo intensifiquen.  Evite las AGCO Corporation ambos pies se separan del suelo al mismo tiempo, por ejemplo, correr, saltar la soga o hacer saltos de tijera.  Aplique hielo sobre la zona de la rodilla: ? Ponga el hielo en una bolsa plstica. ? Coloque una [employer] piel y la bolsa de hielo. ? Coloque el hielo durante 20 minutos, 2 a 3 veces por da.  Pregntele al mdico si debe usar una Scientist, forensic.  Duerma con una almohada debajo de la rodilla.  Baje de peso si es necesario. El sobrepeso puede aumentar el dolor de rodilla.  No consuma ningn producto que contenga tabaco, lo que incluye cigarrillos, tabaco de Higher education careers adviser o Psychologist, sport and exercise. Si necesita ayuda para dejar de fumar, consulte al mdico. Fumar puede retrasar la curacin de cualquier problema que tenga en  el hueso y Water engineer.  SOLICITE AYUDA SI:  El dolor de rodilla no desaparece, cambia o empeora.  Tiene fiebre junto con dolor de rodilla.  La rodilla le falla o se le queda trabada.  La rodilla est ms hinchada.  SOLICITE AYUDA DE INMEDIATO SI:  La rodilla est caliente al tacto.  Tiene dolor en el pecho o dificultad para respirar.  Esta informacin no tiene Marine scientist el consejo del mdico. Asegrese de hacerle al mdico cualquier pregunta que tenga. Document Released: 03/08/2014 Document Revised: 03/08/2014 Document Reviewed: 10/12/2013 Elsevier Interactive Patient Education  2017 Elsevier Inc.     Agustina Caroli, MD [facility]

## 2018-03-17 NOTE — Patient Instructions (Addendum)
IF you received an x-ray today, you will receive an invoice from [facility]. Please contact [facility] at [phone] with questions or concerns regarding your invoice.   IF you received labwork today, you will receive an invoice from [facility]. Please contact [facility] at [phone] with questions or concerns regarding your invoice.   Our billing staff will not be able to assist you with questions regarding bills from these companies.  You will be contacted with the lab results as soon as they are available. The fastest way to get your results is to activate your My Chart account. Instructions are located on the last page of this paperwork. If you have not heard from [place] regarding the results in 2 weeks, please contact this office.     Artrosis Osteoarthritis La artrosis es un tipo de reumatismo articular que afecta el tejido que cubre los extremos de los huesos en las articulaciones (cartlago). El cartlago acta como amortiguador Monsanto Company y los ayuda a moverse con suavidad. La artrosis se produce cuando el cartlago de las articulaciones se gasta. A veces, la artrosis se denomina reumatismo articular "por uso y desgaste". La artrosis es la forma ms frecuente de reumatismo articular. A menudo, afecta a las The First American. Es una enfermedad que empeora con el tiempo (una enfermedad progresiva). Esta enfermedad afecta con ms frecuencia las articulaciones de:  Los dedos de Marriott.  Los dedos Kellogg.  Las caderas.  Las rodillas.  La columna vertebral, incluido el cuello y la zona lumbar.  Cules son las causas? Esta enfermedad es causada por el desgaste del cartlago que cubre los extremos de [place], lo cual tiene relacin con la edad. Qu incrementa el riesgo? Los siguientes factores pueden hacer que usted sea ms propenso a tener esta enfermedad:  Edad avanzada.  Tener exceso de [place] u obesidad.  Uso excesivo de las  articulaciones, como en el caso de los [place].  Lesin pasada de Insurance claims handler.  Ciruga pasada en una articulacin.  Antecedentes familiares de artrosis.  Cules son los signos o los sntomas? Los principales sntomas de esta enfermedad son dolor, hinchazn y Advertising account executive. Con el tiempo, la articulacin puede perder su forma. Se pueden desprender pequeos trozos de Praxair o cartlago y flotar dentro de la articulacin, lo cual puede causar ms dolor y Agricultural consultant. Pueden formarse pequeos depsitos de hueso (ostefitos) en los extremos de Water engineer. Otros sntomas pueden incluir lo siguiente:  Una sensacin de chirrido o raspado dentro de la articulacin al moverla.  Sonidos de chasquido o crujido al [employer].  Los sntomas pueden afectar una o ms articulaciones. La artrosis en una articulacin principal, como la rodilla o la cadera, puede causar dolor al caminar o al realizar ejercicio. Si tiene artrosis IAC/InterActiveCorp, es posible que no pueda agarrar objetos, torcer la mano o controlar pequeos movimientos de las manos y los dedos (motricidad fina). Cmo se diagnostica? Esta enfermedad se puede diagnosticar en funcin de lo siguiente:  Sus antecedentes mdicos.  Un examen fsico.  Sus sntomas.  Radiografas de la(s) articulacin(es) afectada(s).  Anlisis de sangre para descartar otros tipos de reumatismo articular.  Cmo se trata? No hay cura para esta enfermedad, pero el tratamiento puede ayudar a Financial controller y Teacher, English as a foreign language el funcionamiento de Water engineer. Los planes de tratamiento pueden incluir lo siguiente:  Un programa de ejercicios indicado que permita el descanso y el alivio de la articulacin. Puede trabajar  con un fisioterapeuta.  Un plan de control del peso.  Tcnicas de UnumProvident, como las siguientes: ? Aplicacin de calor y fro en la articulacin. ? Impulsos elctricos aplicados a las terminaciones nerviosas que  se encuentran debajo de la piel (neuroestimulacin elctrica transcutnea [TENS]). ? Masajes. ? Ciertos suplementos nutricionales.  Antiiflamatorios no esteroideos o medicamentos recetados para ayudar a Best boy.  Medicamentos para ayudar a Best boy y la inflamacin (corticoesteroides). Estos se pueden administrar por boca (va oral) o mediante una inyeccin.  Dispositivos de [place], como un dispositivo ortopdico, una frula, un guante especial o un bastn.  Ciruga, como: ? Imelda Pillow. Se hace para reposicionar los huesos y Best boy o para retirar los trozos sueltos de hueso y Database administrator. ? Ciruga de reemplazo articular. Es posible que necesite esta ciruga si tiene una artrosis muy grave (avanzada).  Siga estas instrucciones en su casa: Actividad  Haga descansar las articulaciones afectadas segn las indicaciones del mdico.  No conduzca ni use maquinaria pesada mientras toma analgsicos recetados.  Practique los ejercicios que le indiquen. Es posible que el mdico o el fisioterapeuta le recomienden tipos especficos de ejercicios, tales como: ? Ejercicios de fortalecimiento. Se realizan para fortalecer los msculos que sostienen las articulaciones afectadas por el reumatismo. Pueden realizarse con peso o con bandas para agregar resistencia. ? Actividades Precious Haws. Son ejercicios, como caminar a paso ligero o hacer gimnasia [place] acutica, que aumentan la actividad del corazn. ? Actividades de amplitud de movimientos. [place] articulaciones. ? Ejercicios de equilibrio y [place]. Control del dolor, la rigidez y la hinchazn  Si se lo indican, aplique calor en la zona afectada tan frecuentemente como se lo haya indicado el mdico. Use la fuente de calor que el mdico le recomiende, como una compresa de calor hmedo o una almohadilla trmica. ? Si tiene un dispositivo de ayuda que se puede quitar, quteselo segn lo indicado por su  mdico. ? Colquese una toalla entre la piel y la fuente de Freight forwarder. Si el mdico le indica que no se quite el dispositivo de HCA Inc se Passenger transport manager, coloque una toalla entre el dispositivo de [place] y la fuente de Freight forwarder. ? Aplique el calor durante 20 a 41mnutos. ? Retire la fuente de calor si la piel se le pone de color rojo brillante. Esto es muy importante si no puede sentir el dolor, el calor ni el fro. Puede correr un riesgo mayor de sufrir quemaduras.  Si se lo indican, aplique hielo sobre la articulacin afectada: ? Si tiene un dispositivo de ayuda que se puede quitar, quteselo segn lo indicado por su mdico. ? Ponga el hielo en una bolsa plstica. ? Colquese una tGenuine Partspiel y la bolsa de hielo. Si el mdico le indica que no se quite el dispositivo de aHCA Incse aplica hielo, coloque una toalla entre el dispositivo de aSaint Helenay la bolsa de hielo. ? Coloque el hielo durante 235mutos, 2 o 3veces por da. Instrucciones generales  ToDelphie venta libre y los recetados solamente como se lo haya indicado el mdico.  Mantenga un peso saludable. Siga las instrucciones de su mdico con respecto al control del peNewtonEstas pueden incluir restricciones en la dieta.  No consuma ningn producto que contenga nicotina o tabaco, como cigarrillos y ciPsychologist, sport and exerciseEstos pueden retrasar la consolidacin del huAthensSi necesita ayuda para dejar de fumar, consulte al mdMeadWestvaco Use los dispositivos de ayuda como  se lo haya indicado el mdico.  Concurra a todas las visitas de control como se lo haya indicado el mdico. Esto es importante. Dnde encontrar ms informacin:  Air traffic controller de Reumatismo Articular y Arboriculturist Musculoesquelticas y Insurance underwriter [facility] of Arthritis and Musculoskeletal and Skin Diseases): www.niams.SouthExposed.es  Speculator (Lockheed Martin on Aging): http://kim-miller.com/  Instituto  Estadounidense de Reumatologa ([place] of Rheumatology): www.rheumatology.org Comunquese con un mdico si:  La piel se pone roja.  Le aparece una erupcin cutnea.  Siente un dolor que [place].  Tiene fiebre y siente dolor en la articulacin o el msculo. Solicite ayuda de inmediato si:  [facility].  Pierde el apetito repentinamente.  Tiene sudoracin nocturna. Resumen  La artrosis es un tipo de reumatismo articular que afecta el tejido que cubre los extremos de los huesos en las articulaciones (cartlago).  Esta enfermedad es causada por el desgaste del cartlago que cubre los extremos de [place], lo cual tiene relacin con la edad.  Los principales sntomas de esta enfermedad son dolor, hinchazn y Advertising account executive.  No hay cura para esta enfermedad, pero el tratamiento puede ayudar a Financial controller y Teacher, English as a foreign language el funcionamiento de Water engineer. Esta informacin no tiene Marine scientist el consejo del mdico. Asegrese de hacerle al mdico cualquier pregunta que tenga. Document Released: 05/21/2005 Document Revised: 07/07/2016 Document Reviewed: 04/18/2013 Elsevier Interactive Patient Education  2018 [place] (Knee Pain) El dolor de rodilla es un problema frecuente y puede tener muchas causas. A menudo desaparece si se siguen las instrucciones del mdico para el cuidado Financial planner. El tratamiento del dolor continuo depender de su causa. Si el dolor persiste, tal vez haya que realizar ms estudios para Midwife, los cuales pueden incluir radiografas u otros estudios de diagnstico por imgenes de la rodilla. [place] los medicamentos solamente como se lo haya indicado el mdico.  Mantenga la rodilla en reposo y en alto (elevada) mientras est descansando.  No haga cosas que le causen dolor o que lo intensifiquen.  Evite las AGCO Corporation ambos pies se separan del  suelo al mismo tiempo, por ejemplo, correr, saltar la soga o hacer saltos de tijera.  Aplique hielo sobre la zona de la rodilla: ? Ponga el hielo en una bolsa plstica. ? Coloque una Genuine Parts piel y la bolsa de hielo. ? Coloque el hielo durante 20 minutos, 2 a 3 veces por da.  Pregntele al mdico si debe usar una Scientist, forensic.  Duerma con una almohada debajo de la rodilla.  Baje de peso si es necesario. El sobrepeso puede aumentar el dolor de rodilla.  No consuma ningn producto que contenga tabaco, lo que incluye cigarrillos, tabaco de Higher education careers adviser o Psychologist, sport and exercise. Si necesita ayuda para dejar de fumar, consulte al mdico. Fumar puede retrasar la curacin de cualquier problema que tenga en el hueso y Water engineer.  SOLICITE AYUDA SI:  El dolor de rodilla no desaparece, cambia o empeora.  Tiene fiebre junto con dolor de rodilla.  La rodilla le falla o se le queda trabada.  La rodilla est ms hinchada.  SOLICITE AYUDA DE INMEDIATO SI:  La rodilla est caliente al tacto.  Tiene dolor en el pecho o dificultad para respirar.  Esta informacin no tiene Marine scientist el consejo del mdico. Asegrese de hacerle al mdico cualquier pregunta que tenga. Document Released: 03/08/2014 Document Revised: 03/08/2014 Document  Reviewed: 10/12/2013 Elsevier Interactive Patient Education  2017 [employer].

## 2018-05-19 ENCOUNTER — Other Ambulatory Visit: Payer: Self-pay | Admitting: Emergency Medicine

## 2018-05-19 NOTE — Telephone Encounter (Signed)
Voltaren refill Last Refill:Not on med list # 0 Last OV: 03/17/18 PCP: Dr. Mitchel Honour [facility]acy:[facility]

## 2018-07-06 ENCOUNTER — Ambulatory Visit: Payer: BLUE CROSS/BLUE SHIELD | Admitting: Obstetrics & Gynecology

## 2018-07-06 ENCOUNTER — Encounter: Payer: Self-pay | Admitting: Obstetrics & Gynecology

## 2018-07-06 VITALS — BP 128/86 | Ht 60.0 in | Wt 140.0 lb

## 2018-07-06 DIAGNOSIS — Z780 Asymptomatic menopausal state: Secondary | ICD-10-CM | POA: Diagnosis not present

## 2018-07-06 DIAGNOSIS — Z01419 Encounter for gynecological examination (general) (routine) without abnormal findings: Secondary | ICD-10-CM

## 2018-07-06 DIAGNOSIS — Z23 Encounter for immunization: Secondary | ICD-10-CM

## 2018-07-06 NOTE — Progress Notes (Signed)
Autumn Haley [birth year] [id number]   History:    52 y.o. G3P2A1L2 Separated.  2 daughters 40 and 43 yo.  RP:  Established patient presenting for annual gyn exam   HPI: Menopause at age 23 yo, well on no HRT.  No postmenopausal bleeding.  No pelvic pain.  Currently abstinent.  Urine and bowel movements normal.  Breasts normal.  Body mass index 27.34.  Will do fasting health labs here today.  Past medical history,surgical history, family history and social history were all reviewed and documented in the EPIC chart.  Gynecologic History Patient's last menstrual period was 02/19/2017. Contraception: post menopausal status Last Pap: 11/2015. Results were: Negative, HPV HR neg Last mammogram: 08/2013. Results were: Negative Bone Density: Never Colonoscopy: Not yet, will schedule now.  Obstetric History OB History  Gravida Para Term Preterm AB Living  _0 0 1 2  SAB TAB Ectopic Multiple Live Births  1 0 0   2    # Outcome Date GA Lbr Len/2nd Weight Sex Delivery Anes PTL Lv  3 SAB           2 Term     F Vag-Spont  N LIV  1 Term     M Vag-Spont  N LIV     ROS: A ROS was performed and pertinent positives and negatives are included in the history.  GENERAL: No fevers or chills. HEENT: No change in vision, no earache, sore throat or sinus congestion. NECK: No pain or stiffness. CARDIOVASCULAR: No chest pain or pressure. No palpitations. PULMONARY: No shortness of breath, cough or wheeze. GASTROINTESTINAL: No abdominal pain, nausea, vomiting or diarrhea, melena or bright red blood per rectum. GENITOURINARY: No urinary frequency, urgency, hesitancy or dysuria. MUSCULOSKELETAL: No joint or muscle pain, no back pain, no recent trauma. DERMATOLOGIC: No rash, no itching, no lesions. ENDOCRINE: No polyuria, polydipsia, no heat or cold intolerance. No recent change in weight. HEMATOLOGICAL: No anemia or easy bruising or bleeding. NEUROLOGIC: No headache, seizures, numbness, tingling or  weakness. PSYCHIATRIC: No depression, no loss of interest in normal activity or change in sleep pattern.     Exam:   BP 128/86   Ht 5' (1.524 m)   Wt 140 lb (63.5 kg)   LMP 02/19/2017   BMI 27.34 kg/m   Body mass index is 27.34 kg/m.  General appearance : Well developed well nourished female. No acute distress HEENT: Eyes: no retinal hemorrhage or exudates,  Neck supple, trachea midline, no carotid bruits, no thyroidmegaly Lungs: Clear to auscultation, no rhonchi or wheezes, or rib retractions  Heart: Regular rate and rhythm, no murmurs or gallops Breast:Examined in sitting and supine position were symmetrical in appearance, no palpable masses or tenderness,  no skin retraction, no nipple inversion, no nipple discharge, no skin discoloration, no axillary or supraclavicular lymphadenopathy Abdomen: no palpable masses or tenderness, no rebound or guarding Extremities: no edema or skin discoloration or tenderness  Pelvic: Vulva: Normal             Vagina: No gross lesions or discharge  Cervix: No gross lesions or discharge.  Pap reflex done  Uterus  AV, normal size, shape and consistency, non-tender and mobile  Adnexa  Without masses or tenderness  Anus: Normal   Assessment/Plan:  52 y.o. female for annual exam   1. Encounter for routine gynecological examination with Papanicolaou smear of cervix Normal gynecologic exam.  Pap reflex done today.  Breast exam normal.  Schedule screening mammogram now.  Referred to [facility] for screening Colonoscopy.  Fasting health labs here today. - CBC - Comp Met (CMET) - Lipid panel - TSH - VITAMIN D 25 Hydroxy (Vit-D Deficiency, Fractures)  2. Postmenopause Well on no hormone replacement therapy.  No postmenopausal bleeding.  Recommend vitamin D supplements, calcium intake of 1.5 g/day and weightbearing physical activity regularly.  3. Needs flu shot Flu shot given today.  Princess Bruins MD, 11:20 AM 07/06/2018

## 2018-07-07 ENCOUNTER — Telehealth: Payer: Self-pay | Admitting: *Deleted

## 2018-07-07 DIAGNOSIS — Z1211 Encounter for screening for malignant neoplasm of colon: Secondary | ICD-10-CM

## 2018-07-07 LAB — PAP IG W/ RFLX HPV ASCU

## 2018-07-07 LAB — LIPID PANEL
Cholesterol: 212 mg/dL — ABNORMAL HIGH (ref <200)
HDL: 58 mg/dL (ref >50)
LDL Cholesterol (Calc): 137 mg/dL (calc) — ABNORMAL HIGH
NON-HDL CHOLESTEROL (CALC): 154 mg/dL — AB (ref <130)
Total CHOL/HDL Ratio: 3.7 (calc) (ref <5.0)
Triglycerides: 78 mg/dL (ref <150)

## 2018-07-07 LAB — CBC
HEMATOCRIT: 37.4 % (ref 35.0–45.0)
HEMOGLOBIN: 12.8 g/dL (ref 11.7–15.5)
MCH: 30.1 pg (ref 27.0–33.0)
MCHC: 34.2 g/dL (ref 32.0–36.0)
MCV: 88 fL (ref 80.0–100.0)
MPV: 10 fL (ref 7.5–12.5)
Platelets: 288 10*3/uL (ref 140–400)
RBC: 4.25 10*6/uL (ref 3.80–5.10)
RDW: 13.3 % (ref 11.0–15.0)
WBC: 7.5 10*3/uL (ref 3.8–10.8)

## 2018-07-07 LAB — COMPREHENSIVE METABOLIC PANEL
AG RATIO: 1.4 (calc) (ref 1.0–2.5)
ALT: 18 U/L (ref 6–29)
AST: 19 U/L (ref 10–35)
Albumin: 4.1 g/dL (ref 3.6–5.1)
Alkaline phosphatase (APISO): 66 U/L (ref 33–130)
BUN: 21 mg/dL (ref 7–25)
CHLORIDE: 107 mmol/L (ref 98–110)
CO2: 26 mmol/L (ref 20–32)
Calcium: 8.5 mg/dL — ABNORMAL LOW (ref 8.6–10.4)
Creat: 0.56 mg/dL (ref 0.50–1.05)
GLOBULIN: 2.9 g/dL (ref 1.9–3.7)
GLUCOSE: 97 mg/dL (ref 65–99)
Potassium: 4.1 mmol/L (ref 3.5–5.3)
SODIUM: 138 mmol/L (ref 135–146)
TOTAL PROTEIN: 7 g/dL (ref 6.1–8.1)
Total Bilirubin: 0.4 mg/dL (ref 0.2–1.2)

## 2018-07-07 LAB — VITAMIN D 25 HYDROXY (VIT D DEFICIENCY, FRACTURES): VIT D 25 HYDROXY: 34 ng/mL (ref 30–100)

## 2018-07-07 LAB — TSH: TSH: 1.32 m[IU]/L

## 2018-07-07 NOTE — Telephone Encounter (Signed)
Referral placed in epic/Proficent they will call to schedule.

## 2018-07-07 NOTE — Telephone Encounter (Signed)
-----   Message from Princess Bruins, MD sent at 07/06/2018 11:48 AM EST ----- Regarding: Refer to [facility] 52 yo screening colono

## 2018-07-11 NOTE — Patient Instructions (Signed)
1. Encounter for routine gynecological examination with Papanicolaou smear of cervix Normal gynecologic exam.  Pap reflex done today.  Breast exam normal.  Schedule screening mammogram now.  Referred to [facility] for screening Colonoscopy.  Fasting health labs here today. - CBC - Comp Met (CMET) - Lipid panel - TSH - VITAMIN D 25 Hydroxy (Vit-D Deficiency, Fractures)  2. Postmenopause Well on no hormone replacement therapy.  No postmenopausal bleeding.  Recommend vitamin D supplements, calcium intake of 1.5 g/day and weightbearing physical activity regularly.  3. Needs flu shot Flu shot given today.  [place] un placer encontrarle hoy!  Voy a informarle de sus [employer].

## 2018-07-13 ENCOUNTER — Ambulatory Visit: Payer: Self-pay | Admitting: *Deleted

## 2018-07-13 NOTE — Telephone Encounter (Signed)
Patient's daughter is calling to report patient has injured her ankle- it is swollen and purple. Patient is slightly limping on it.  Daughter states that the foot has good circulation- toes are not swollen and moveable. Swelling does go down with elevation and icing at night. Patient is actively working and wants to see PCP at office. Appointment scheduled for patient- daughter is to assess again tonight to make sure there are no changes- if there are- she is instructed to take her mother to [facility] for quicker assessment.   Reason for Disposition . [1] Limp when walking AND [2] due to a twisted ankle or foot  Answer Assessment - Initial Assessment Questions 1. LOCATION: "Which joint is swollen?"     Left ankle swelling 2. ONSET: "When did the swelling start?"     Sunday night 3. SIZE: "How large is the swelling?"     Can still see ankle bone- but swollen all around and into the foot area 4. PAIN: "Is there any pain?" If so, ask: "How bad is it?" (Scale 1-10; or mild, moderate, severe)     Patient is limping 5. CAUSE: "What do you think caused the swollen joint?"     no 6. OTHER SYMPTOMS: "Do you have any other symptoms?" (e.g., fever, chest pain, difficulty breathing, calf pain)     Possible injury 7. PREGNANCY: "Is there any chance you are pregnant?" "When was your last menstrual period?"     Not sure-daughter is answering questions  Protocols used: FOOT AND ANKLE [place], ANKLE SWELLING-A-AH

## 2018-07-14 ENCOUNTER — Ambulatory Visit: Payer: Self-pay | Admitting: Family Medicine

## 2018-07-15 NOTE — Telephone Encounter (Signed)
[place] called and left message to call and schedule. I will close my encounter.

## 2018-08-12 ENCOUNTER — Ambulatory Visit: Payer: BLUE CROSS/BLUE SHIELD | Admitting: Emergency Medicine

## 2018-08-12 ENCOUNTER — Other Ambulatory Visit: Payer: Self-pay

## 2018-08-12 ENCOUNTER — Encounter: Payer: Self-pay | Admitting: Emergency Medicine

## 2018-08-12 ENCOUNTER — Telehealth: Payer: Self-pay | Admitting: Emergency Medicine

## 2018-08-12 VITALS — BP 156/71 | HR 62 | Temp 98.5°F | Resp 16 | Ht 60.5 in | Wt 136.6 lb

## 2018-08-12 DIAGNOSIS — G8929 Other chronic pain: Secondary | ICD-10-CM

## 2018-08-12 DIAGNOSIS — Z1239 Encounter for other screening for malignant neoplasm of breast: Secondary | ICD-10-CM | POA: Diagnosis not present

## 2018-08-12 DIAGNOSIS — M1712 Unilateral primary osteoarthritis, left knee: Secondary | ICD-10-CM

## 2018-08-12 DIAGNOSIS — M25562 Pain in left knee: Secondary | ICD-10-CM

## 2018-08-12 MED ORDER — METHYLPREDNISOLONE ACETATE 80 MG/ML IJ SUSP
80.0000 mg | Freq: Once | INTRAMUSCULAR | Status: AC
  Administered 2018-08-12: 80 mg via INTRAMUSCULAR

## 2018-08-12 MED ORDER — PREDNISONE 20 MG PO TABS: 40.0000 mg | ORAL_TABLET | Freq: Every day | ORAL | 0 refills | Status: AC

## 2018-08-12 NOTE — Progress Notes (Signed)
Autumn Haley 52 y.o.   Chief Complaint  Patient presents with  . Knee Pain    LEFT and left foot x 3 weeks    HISTORY OF PRESENT ILLNESS: This is a 52 y.o. female complaining of persistent pain to her left knee for the past 3 weeks.  Also twisted left ankle 3 weeks ago and had a lot of bruising, swelling and tenderness but is much better now.  Able to walk.  HPI   Prior to Admission medications   Medication Sig Start Date End Date Taking? Authorizing Provider  Calcium Carb-Cholecalciferol (CALCIUM 600+D3 PO) Take by mouth daily.   Yes [provider]  Cholecalciferol (VITAMIN D3 PO) Take by mouth daily.   Yes [provider]    Allergies  Allergen Reactions  . Levaquin [Levofloxacin] Rash    Patient Active Problem List   Diagnosis Date Noted  . Chronic pain of left knee 03/17/2018  . Primary osteoarthritis of left knee 03/17/2018  . Pain in finger of both hands 06/17/2017  . Osteoarthritis of fingers of hands, bilateral 06/17/2017  . Arthralgia 02/23/2017  . Vitamin D deficiency 02/11/2017  . Hypocalcemia 02/11/2017  . Renal dysfunction 07/26/2012  . Fibroid uterus 12/26/2011    Past Medical History:  Diagnosis Date  . Arthritis   . Chronic kidney disease    right kidney  . Fibroid    FIBROID UTERUS  . NSVD (normal spontaneous vaginal delivery)    X 2  . NSVD (normal spontaneous vaginal delivery)    X 2  . SAB (spontaneous abortion)   . SAB (spontaneous abortion)   . Vitamin D deficiency     No past surgical history on file.  Social History   Socioeconomic History  . Marital status: Married    Spouse name: Donnie Aho  . Number of children: 2  . Years of education: 41  . Highest education level: Not on file  Occupational History  . 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 Financial resource strain: Not on file  . Food insecurity:    Worry: Not on file    Inability:  Not on file  . Transportation needs:    Medical: Not on file    Non-medical: Not on file  Tobacco Use  . Smoking status: Never Smoker  . Smokeless tobacco: Never Used  Substance and Sexual Activity  . Alcohol use: No    Comment: OCCASIONALLY  . Drug use: No  . Sexual activity: Not Currently    Partners: Male    Birth control/protection: Rhythm  Lifestyle  . Physical activity:    Days per week: Not on file    Minutes per session: Not on file  . Stress: Not on file  Relationships  . Social connections:    Talks on phone: Not on file    Gets together: Not on file    Attends religious service: Not on file    Active member of club or organization: Not on file    Attends meetings of clubs or organizations: Not on file    Relationship status: Not on file  . Intimate partner violence:    Fear of current or ex partner: Not on file    Emotionally abused: Not on file    Physically abused: Not on file    Forced sexual activity: Not on file  Other Topics Concern  . Not on file  Social History Narrative   **  Merged History Encounter **       From [place], [place].  Lives here with her husband and 2 children.    No family history on file.   Review of Systems  Constitutional: Negative.  Negative for chills and fever.  HENT: Negative.   Eyes: Negative.   Respiratory: Negative.  Negative for cough and shortness of breath.   Cardiovascular: Negative.  Negative for chest pain and palpitations.  Gastrointestinal: Negative.  Negative for abdominal pain, diarrhea, nausea and vomiting.  Musculoskeletal: Positive for joint pain (Chronic left knee pain).  Skin: Negative.  Negative for rash.  Neurological: Negative for dizziness and headaches.  Endo/Heme/Allergies: Negative.   All other systems reviewed and are negative.   Vitals:   08/12/18 1010  BP: (!) 156/71  Pulse: 62  Resp: 16  Temp: 98.5 F (36.9 C)  SpO2: 97%    Physical Exam Vitals signs reviewed.  Constitutional:       Appearance: Normal appearance.  HENT:     Head: Normocephalic and atraumatic.  Eyes:     Pupils: Pupils are equal, round, and reactive to light.  Neck:     Musculoskeletal: Normal range of motion and neck supple.  Cardiovascular:     Rate and Rhythm: Normal rate and regular rhythm.     Heart sounds: Normal heart sounds.  Pulmonary:     Effort: Pulmonary effort is normal.     Breath sounds: Normal breath sounds.  Musculoskeletal:     Comments: Left knee: No erythema or swelling.  No tenderness.  Full range of motion.  Stable in flexion and extension. Left ankle: Still has some residual swelling and bruising from recent ankle sprain.  Full range of motion.  Skin:    General: Skin is warm and dry.     Capillary Refill: Capillary refill takes less than 2 seconds.  Neurological:     General: No focal deficit present.     Mental Status: She is alert and oriented to person, place, and time.  Psychiatric:        Mood and Affect: Mood normal.        Behavior: Behavior normal.     A total of 25 minutes was spent in the room with the patient, greater than 50% of which was in counseling/coordination of care regarding differential diagnosis, treatment, medications, need for diagnostic imaging, and need for follow-up with orthopedist.   ASSESSMENT & PLAN: Autumn Haley was seen today for knee pain.  Diagnoses and all orders for this visit:  Chronic pain of left knee -     MR Knee Left  Wo Contrast; Future -     Ambulatory referral to Orthopedic Surgery  Primary osteoarthritis of left knee -     methylPREDNISolone acetate (DEPO-MEDROL) injection 80 mg -     predniSONE (DELTASONE) 20 MG tablet; Take 2 tablets (40 mg total) by mouth daily with breakfast for 5 days. -     MR Knee Left  Wo Contrast; Future -     Ambulatory referral to Orthopedic Surgery  Breast cancer screening -     MM Digital Screening; Future    Patient Instructions       If you have lab work done today you  will be contacted with your lab results within the next 2 weeks.  If you have not heard from [place] then please contact us. The fastest way to get your results is to register for My Chart.   IF you received an x-ray today,  you will receive an invoice from [facility]. Please contact [facility] at [phone] with questions or concerns regarding your invoice.   IF you received labwork today, you will receive an invoice from [place]. Please contact [facility] at [phone] with questions or concerns regarding your invoice.   Our billing staff will not be able to assist you with questions regarding bills from these companies.  You will be contacted with the lab results as soon as they are available. The fastest way to get your results is to activate your My Chart account. Instructions are located on the last page of this paperwork. If you have not heard from [place] regarding the results in 2 weeks, please contact this office.     Acute Knee Pain, Adult Many things can cause knee pain. Sometimes, knee pain is sudden (acute) and may be caused by damage, swelling, or irritation of the muscles and tissues that support your knee. The pain often goes away on its own with time and rest. If the pain does not go away, tests may be done to find out what is causing the pain. Follow these instructions at home: Pay attention to any changes in your symptoms. Take these actions to relieve your pain. If you have a knee sleeve or brace:   Wear the sleeve or brace as told by your doctor. Remove it only as told by your doctor.  Loosen the sleeve or brace if your toes: ? Tingle. ? Become numb. ? Turn cold and blue.  Keep the sleeve or brace clean.  If the sleeve or brace is not waterproof: ? Do not let it get wet. ? Cover it with a watertight covering when you take a bath or shower. Activity  Rest your knee.  Do not do things that cause pain.  Avoid activities where both feet leave the  ground at the same time (high-impact activities). Examples are running, jumping rope, and doing jumping jacks.  Work with a physical therapist to make a safe exercise program, as told by your doctor. Managing pain, stiffness, and swelling   If told, put ice on the knee: ? Put ice in a plastic bag. ? Place a towel between your skin and the bag. ? Leave the ice on for 20 minutes, 2-3 times a day.  If told, put pressure (compression) on your injured knee to control swelling, give support, and help with discomfort. Compression may be done with an elastic bandage. General instructions  Take all medicines only as told by your doctor.  Raise (elevate) your knee while you are sitting or lying down. Make sure your knee is higher than your heart.  Sleep with a pillow under your knee.  Do not use any products that contain nicotine or tobacco. These include cigarettes, e-cigarettes, and chewing tobacco. These products may slow down healing. If you need help quitting, ask your doctor.  If you are overweight, work with your doctor and a food expert (dietitian) to set goals to lose weight. Being overweight can make your knee hurt more.  Keep all follow-up visits as told by your doctor. This is important. Contact a doctor if:  The knee pain does not stop.  The knee pain changes or gets worse.  You have a fever along with knee pain.  Your knee feels warm when you touch it.  Your knee gives out or locks up. Get help right away if:  Your knee swells, and the swelling gets worse.  You cannot move your knee.  You have very bad knee pain. Summary  Many things can cause knee pain. The pain often goes away on its own with time and rest.  Your doctor may do tests to find out the cause of the pain.  Pay attention to any changes in your symptoms. Relieve your pain with rest, medicines, light activity, and use of ice.  Get help right away if you cannot move your knee or your knee pain is very  bad. This information is not intended to replace advice given to you by your health care provider. Make sure you discuss any questions you have with your health care provider. Document Released: 11/07/2008 Document Revised: 01/21/2018 Document Reviewed: 01/21/2018 Elsevier Interactive Patient Education  2019 [place] agudo de rodilla en los adultos Acute Knee Pain, Adult El dolor de rodilla puede tener muchas causas. A veces, el dolor de rodilla es repentino (agudo) y puede deberse a dao, hinchazn o irritacin de los msculos y tejidos que sostienen la rodilla. A menudo, el dolor desaparece solo con el tiempo y con reposo. Si el dolor no desaparece, pueden hacerse pruebas para hallar su causa. Siga estas indicaciones en su casa: Est atento a cualquier cambio en los sntomas. Tome estas medidas para Best boy. Si tiene una rodillera o un dispositivo ortopdico:   Use la rodillera o el dispositivo ortopdico como se lo haya indicado el mdico. Quteselo solamente como se lo haya indicado el mdico.  Afloje la rodillera o el dispositivo ortopdico si los dedos de los pies: ? Hormiguean. ? Se adormecen. ? Se tornan fros y de [employer].  Mantenga la rodillera o el dispositivo ortopdico limpio.  Si la rodillera o el dispositivo ortopdico no es impermeable: ? No deje que se mojen. ? Cbralo con un envoltorio hermtico cuando tome un bao de inmersin o una ducha. Actividad  Descanse la rodilla.  No haga cosas que le causen dolor.  Evite las actividades en las que ambos pies no estn en contacto con el piso al mismo tiempo (actividades de alto impacto). Algunos ejemplos son correr, Art therapist soga y hacer saltos de tijera.  Trabaje con un fisioterapeuta para crear un programa de ejercicios seguros, como le haya indicado el mdico. Control del dolor, el entumecimiento y la hinchazn   Si se lo indican, aplique hielo sobre la rodilla: ? Ponga el hielo en una  bolsa plstica. ? Coloque una [employer] piel y Therapist, nutritional. ? Coloque el hielo durante 63mnutos, 2 a 3veces por da.  Si se lo indican, aplique presin (compresin) sobre la rodilla lesionada para controlar la hinchazn, dar apoyo y ayudar a aPublic house managerlas [place] Una venda elstica sirve para la compresin. Indicaciones generales  TCablevision Systemsmedicamentos solamente como se lo haya indicado el mdico.  Levante (eleve) la rodilla mientras est sentado o recostado. Asegrese de que la rodilla est en una posicin ms alta que el nivel del corazn.  Duerma con una almohada debajo de la rodilla.  No consuma ningn producto que contenga nicotina o tabaco. Estos incluyen cigarrillos, cigarrillos electrnicos y tabaco para mHigher education careers adviser Estos productos pueden retrasar el proceso de curacin. Si necesita ayuda para dejar de fumar, consulte al mdico.  Si tiene sobrepeso, trabaje con su mdico y un experto en alimentos (nutricionista) para establecer metas para bajar de peso. El sobrepeso puede aumentar el dolor de rodilla.  Concurra a todas las visitas de control como se lo haya indicado el mdico. Esto es importante. Comunquese con  un mdico si:  El dolor de rodilla no desaparece.  El dolor de rodilla cambia o [place].  Tiene fiebre junto con dolor de rodilla.  La rodilla se siente caliente cuando la toca.  La rodilla le falla o se le queda trabada. Solicite ayuda inmediatamente si:  La rodilla se le hincha, y la Counselling psychologist.  No puede mover la rodilla.  Siente mucho dolor en la rodilla. Resumen  El dolor de rodilla puede tener muchas causas. A menudo, el dolor desaparece solo con el tiempo y con reposo.  El mdico puede hacerle estudios para Neurosurgeon la causa del dolor.  Est atento a cualquier cambio en los sntomas. [employer] dolor con descanso, medicamentos, actividad de poca intensidad y [place] de hielo.  Solicite ayuda de inmediato si no puede mover la rodilla o  si el dolor de rodilla es muy intenso. Esta informacin no tiene Marine scientist el consejo del mdico. Asegrese de hacerle al mdico cualquier pregunta que tenga. Document Released: 03/08/2014 Document Revised: 03/22/2018 Document Reviewed: 03/22/2018 Elsevier Interactive Patient Education  2019 Elsevier Inc.      Agustina Caroli, MD [facility]

## 2018-08-12 NOTE — Telephone Encounter (Signed)
Patients MRI was denied please review and adv  , they suggested Peer to Peer  [phone]

## 2018-08-12 NOTE — Patient Instructions (Addendum)
If you have lab work done today you will be contacted with your lab results within the next 2 weeks.  If you have not heard from [place] then please contact us. The fastest way to get your results is to register for My Chart.   IF you received an x-ray today, you will receive an invoice from [facility]. Please contact [facility] at [phone] with questions or concerns regarding your invoice.   IF you received labwork today, you will receive an invoice from [place]. Please contact [facility] at [phone] with questions or concerns regarding your invoice.   Our billing staff will not be able to assist you with questions regarding bills from these companies.  You will be contacted with the lab results as soon as they are available. The fastest way to get your results is to activate your My Chart account. Instructions are located on the last page of this paperwork. If you have not heard from [place] regarding the results in 2 weeks, please contact this office.     Acute Knee Pain, Adult Many things can cause knee pain. Sometimes, knee pain is sudden (acute) and may be caused by damage, swelling, or irritation of the muscles and tissues that support your knee. The pain often goes away on its own with time and rest. If the pain does not go away, tests may be done to find out what is causing the pain. Follow these instructions at home: Pay attention to any changes in your symptoms. Take these actions to relieve your pain. If you have a knee sleeve or brace:   Wear the sleeve or brace as told by your doctor. Remove it only as told by your doctor.  Loosen the sleeve or brace if your toes: ? Tingle. ? Become numb. ? Turn cold and blue.  Keep the sleeve or brace clean.  If the sleeve or brace is not waterproof: ? Do not let it get wet. ? Cover it with a watertight covering when you take a bath or shower. Activity  Rest your knee.  Do not do things that cause  pain.  Avoid activities where both feet leave the ground at the same time (high-impact activities). Examples are running, jumping rope, and doing jumping jacks.  Work with a physical therapist to make a safe exercise program, as told by your doctor. Managing pain, stiffness, and swelling   If told, put ice on the knee: ? Put ice in a plastic bag. ? Place a towel between your skin and the bag. ? Leave the ice on for 20 minutes, 2-3 times a day.  If told, put pressure (compression) on your injured knee to control swelling, give support, and help with discomfort. Compression may be done with an elastic bandage. General instructions  Take all medicines only as told by your doctor.  Raise (elevate) your knee while you are sitting or lying down. Make sure your knee is higher than your heart.  Sleep with a pillow under your knee.  Do not use any products that contain nicotine or tobacco. These include cigarettes, e-cigarettes, and chewing tobacco. These products may slow down healing. If you need help quitting, ask your doctor.  If you are overweight, work with your doctor and a food expert (dietitian) to set goals to lose weight. Being overweight can make your knee hurt more.  Keep all follow-up visits as told by your doctor. This is important. Contact a doctor if:  The knee pain does not  stop.  The knee pain changes or gets worse.  You have a fever along with knee pain.  Your knee feels warm when you touch it.  Your knee gives out or locks up. Get help right away if:  Your knee swells, and the swelling gets worse.  You cannot move your knee.  You have very bad knee pain. Summary  Many things can cause knee pain. The pain often goes away on its own with time and rest.  Your doctor may do tests to find out the cause of the pain.  Pay attention to any changes in your symptoms. Relieve your pain with rest, medicines, light activity, and use of ice.  Get help right away if  you cannot move your knee or your knee pain is very bad. This information is not intended to replace advice given to you by your health care provider. Make sure you discuss any questions you have with your health care provider. Document Released: 11/07/2008 Document Revised: 01/21/2018 Document Reviewed: 01/21/2018 Elsevier Interactive Patient Education  2019 [place] agudo de rodilla en los adultos Acute Knee Pain, Adult El dolor de rodilla puede tener muchas causas. A veces, el dolor de rodilla es repentino (agudo) y puede deberse a dao, hinchazn o irritacin de los msculos y tejidos que sostienen la rodilla. A menudo, el dolor desaparece solo con el tiempo y con reposo. Si el dolor no desaparece, pueden hacerse pruebas para hallar su causa. Siga estas indicaciones en su casa: Est atento a cualquier cambio en los sntomas. Tome estas medidas para Best boy. Si tiene una rodillera o un dispositivo ortopdico:   Use la rodillera o el dispositivo ortopdico como se lo haya indicado el mdico. Quteselo solamente como se lo haya indicado el mdico.  Afloje la rodillera o el dispositivo ortopdico si los dedos de los pies: ? Hormiguean. ? Se adormecen. ? Se tornan fros y de [employer].  Mantenga la rodillera o el dispositivo ortopdico limpio.  Si la rodillera o el dispositivo ortopdico no es impermeable: ? No deje que se mojen. ? Cbralo con un envoltorio hermtico cuando tome un bao de inmersin o una ducha. Actividad  Descanse la rodilla.  No haga cosas que le causen dolor.  Evite las actividades en las que ambos pies no estn en contacto con el piso al mismo tiempo (actividades de alto impacto). Algunos ejemplos son correr, Art therapist soga y hacer saltos de tijera.  Trabaje con un fisioterapeuta para crear un programa de ejercicios seguros, como le haya indicado el mdico. Control del dolor, el entumecimiento y la hinchazn   Si se lo indican, aplique  hielo sobre la rodilla: ? Ponga el hielo en una bolsa plstica. ? Coloque una [employer] piel y Therapist, nutritional. ? Coloque el hielo durante 35mnutos, 2 a 3veces por da.  Si se lo indican, aplique presin (compresin) sobre la rodilla lesionada para controlar la hinchazn, dar apoyo y ayudar a aPublic house managerlas [place] Una venda elstica sirve para la compresin. Indicaciones generales  TCablevision Systemsmedicamentos solamente como se lo haya indicado el mdico.  Levante (eleve) la rodilla mientras est sentado o recostado. Asegrese de que la rodilla est en una posicin ms alta que el nivel del corazn.  Duerma con una almohada debajo de la rodilla.  No consuma ningn producto que contenga nicotina o tabaco. Estos incluyen cigarrillos, cigarrillos electrnicos y tabaco para mHigher education careers adviser Estos productos pueden retrasar el proceso de curacin. Si necesita ayuda  para dejar de fumar, consulte al mdico.  Si tiene sobrepeso, trabaje con su mdico y un experto en alimentos (nutricionista) para establecer metas para bajar de peso. El sobrepeso puede aumentar el dolor de rodilla.  Concurra a todas las visitas de control como se lo haya indicado el mdico. Esto es importante. Comunquese con un mdico si:  El dolor de rodilla no desaparece.  El dolor de rodilla cambia o [place].  Tiene fiebre junto con dolor de rodilla.  La rodilla se siente caliente cuando la toca.  La rodilla le falla o se le queda trabada. Solicite ayuda inmediatamente si:  La rodilla se le hincha, y la Counselling psychologist.  No puede mover la rodilla.  Siente mucho dolor en la rodilla. Resumen  El dolor de rodilla puede tener muchas causas. A menudo, el dolor desaparece solo con el tiempo y con reposo.  El mdico puede hacerle estudios para Neurosurgeon la causa del dolor.  Est atento a cualquier cambio en los sntomas. [employer] dolor con descanso, medicamentos, actividad de poca intensidad y [place] de hielo.  Solicite  ayuda de inmediato si no puede mover la rodilla o si el dolor de rodilla es muy intenso. Esta informacin no tiene Marine scientist el consejo del mdico. Asegrese de hacerle al mdico cualquier pregunta que tenga. Document Released: 03/08/2014 Document Revised: 03/22/2018 Document Reviewed: 03/22/2018 Elsevier Interactive Patient Education  2019 [employer].

## 2018-08-12 NOTE — Telephone Encounter (Signed)
Will need orthopedic evaluation first.  Thanks.

## 2018-08-23 ENCOUNTER — Ambulatory Visit (INDEPENDENT_AMBULATORY_CARE_PROVIDER_SITE_OTHER): Payer: BLUE CROSS/BLUE SHIELD | Admitting: Family Medicine

## 2018-08-23 ENCOUNTER — Encounter (INDEPENDENT_AMBULATORY_CARE_PROVIDER_SITE_OTHER): Payer: Self-pay | Admitting: Family Medicine

## 2018-08-23 DIAGNOSIS — G8929 Other chronic pain: Secondary | ICD-10-CM

## 2018-08-23 DIAGNOSIS — M25562 Pain in left knee: Secondary | ICD-10-CM | POA: Diagnosis not present

## 2018-08-23 MED ORDER — ETODOLAC 400 MG PO TABS: 400.0000 mg | ORAL_TABLET | Freq: Two times a day (BID) | ORAL | 3 refills | Status: DC | PRN

## 2018-08-23 NOTE — Progress Notes (Signed)
Office Visit Note   Patient: Autumn Haley           Date of Birth: May 16, 1966           MRN: [id number] Visit Date: 08/23/2018 Requested by: Horald Pollen, MD [place], [place] [address] PCP: Horald Pollen, MD  Subjective: Chief Complaint  Patient presents with  . Left Knee - Pain    Chronic pain (mainly medial aspect), occasional swelling, feels like it will give way, cracks/pops    HPI: She is a 52 year old with left knee pain.  Symptoms started approximately 2016, she recalls falling at some point but does not remember the exact mechanism of injury.  She has had intermittent pain on the anterior medial aspect since then.  She has tried some over-the-counter remedies with minimal improvement.  She had x-rays in 2016 which we reviewed on computer today showing minimal arthritic change.  She describes popping with movement, no locking.  No sensation of instability.  Pain throughout the day, but especially at night when trying to sleep.  She is otherwise in good health, no fevers/chills/night sweats.  Interpreter was with her today.               ROS: Noncontributory  Objective: Vital Signs: LMP 02/19/2017   Physical Exam:  Left knee: No pain with internal hip rotation.  1+ effusion with no warmth or erythema.  2+ patellofemoral crepitus, slight pain with patella compression.  Slight tenderness on the medial patellofemoral joint and the medial joint line.  No palpable click with McMurray's, ligaments feel stable.  Imaging: None today.  Assessment & Plan: 1.  Chronic left knee pain, cannot rule out medial meniscus injury.  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 -Discussed options with her, she wants to try cortisone injection and also proceed with MRI scan.    Follow-Up Instructions: No follow-ups on file.      Procedures: Left knee steroid injection: After sterile prep with Betadine, injected 3 cc 1% lidocaine without epinephrine and  40 mg methylprednisolone from superolateral approach, a flash of clear yellow synovial fluid was obtained prior to injection.    PMFS History: Patient Active Problem List   Diagnosis Date Noted  . Chronic pain of left knee 03/17/2018  . Primary osteoarthritis of left knee 03/17/2018  . Pain in finger of both hands 06/17/2017  . Osteoarthritis of fingers of hands, bilateral 06/17/2017  . Arthralgia 02/23/2017  . Vitamin D deficiency 02/11/2017  . Hypocalcemia 02/11/2017  . Renal dysfunction 07/26/2012  . Fibroid uterus 12/26/2011   Past Medical History:  Diagnosis Date  . Arthritis   . Chronic kidney disease    right kidney  . Fibroid    FIBROID UTERUS  . NSVD (normal spontaneous vaginal delivery)    X 2  . NSVD (normal spontaneous vaginal delivery)    X 2  . SAB (spontaneous abortion)   . SAB (spontaneous abortion)   . Vitamin D deficiency     History reviewed. No pertinent family history.  History reviewed. No pertinent surgical history. Social History   Occupational History  . 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 Smoking status: Never Smoker  . Smokeless tobacco: Never Used  Substance and Sexual Activity  . Alcohol use: No    Comment: OCCASIONALLY  . Drug use: No  . Sexual activity: Not Currently    Partners: Male    Birth control/protection: Rhythm

## 2018-08-26 ENCOUNTER — Other Ambulatory Visit: Payer: Self-pay | Admitting: Emergency Medicine

## 2018-08-26 DIAGNOSIS — Z1231 Encounter for screening mammogram for malignant neoplasm of breast: Secondary | ICD-10-CM

## 2018-09-06 ENCOUNTER — Ambulatory Visit
Admission: RE | Admit: 2018-09-06 | Discharge: 2018-09-06 | Disposition: A | Discharge status: Home / Self Care | Payer: BLUE CROSS/BLUE SHIELD | Source: Ambulatory Visit | Attending: Family Medicine | Admitting: Family Medicine

## 2018-09-06 ENCOUNTER — Telehealth (INDEPENDENT_AMBULATORY_CARE_PROVIDER_SITE_OTHER): Payer: Self-pay | Admitting: Family Medicine

## 2018-09-06 DIAGNOSIS — M25562 Pain in left knee: Principal | ICD-10-CM

## 2018-09-06 DIAGNOSIS — G8929 Other chronic pain: Secondary | ICD-10-CM

## 2018-09-06 NOTE — Telephone Encounter (Signed)
MRI shows medial meniscus tear as well as osteoarthritis.  If pain has not improved, then we will proceed with surgical consult.

## 2018-09-07 NOTE — Telephone Encounter (Signed)
Will you call this for me? Dr. Junius Roads suggested Dr. Marlou Sa or Dr. Erlinda Hong for the consult.

## 2018-09-10 NOTE — Telephone Encounter (Signed)
Lovena Le advised on message. appt was made.

## 2018-09-10 NOTE — Telephone Encounter (Signed)
Tried to call no answer. LMOM.

## 2018-09-13 ENCOUNTER — Encounter: Payer: Self-pay | Admitting: Obstetrics & Gynecology

## 2018-09-21 ENCOUNTER — Encounter (INDEPENDENT_AMBULATORY_CARE_PROVIDER_SITE_OTHER): Payer: Self-pay | Admitting: Orthopaedic Surgery

## 2018-09-21 ENCOUNTER — Ambulatory Visit (INDEPENDENT_AMBULATORY_CARE_PROVIDER_SITE_OTHER): Payer: BLUE CROSS/BLUE SHIELD | Admitting: Orthopaedic Surgery

## 2018-09-21 VITALS — Ht 60.0 in | Wt 136.0 lb

## 2018-09-21 DIAGNOSIS — M1712 Unilateral primary osteoarthritis, left knee: Secondary | ICD-10-CM

## 2018-09-21 NOTE — Progress Notes (Signed)
Office Visit Note   Patient: Autumn Haley           Date of Birth: Sep 16, 1965           MRN: [id number] Visit Date: 09/21/2018              Requested by: Horald Pollen, MD [place], [place] [address] PCP: Horald Pollen, MD   Assessment & Plan: Visit Diagnoses:  1. Primary osteoarthritis of left knee     Plan: Impression is left knee pain more so from degenerative joint disease than the medial meniscus tear.  She states that the cortisone injection has given her significant relief and she is doing quite well.  We did provide her with a hinged knee brace to wear.  She knows that she should call us back if she starts to have problems with the left knee again.  Otherwise we can see her back as needed. Today's encounter was performed through an interpreter which increase the complexity of the office visit.  Follow-Up Instructions: Return if symptoms worsen or fail to improve.   Orders:  No orders of the defined types were placed in this encounter.  No orders of the defined types were placed in this encounter.     Procedures: No procedures performed   Clinical Data: No additional findings.   Subjective: Chief Complaint  Patient presents with  . Left Knee - Pain    Autumn Haley is coming to see [place] today for MRI findings consistent with a medial meniscal tear and arthritis.  She states that overall her pain is minimal.  She takes meloxicam for the pain.  Otherwise she is doing well.  She denies any swelling.  She denies any mechanical symptoms.  She has significant improvement from the cortisone injection.   Review of Systems   Objective: Vital Signs: Ht 5' (1.524 m)   Wt 136 lb (61.7 kg)   LMP 02/19/2017   BMI 26.56 kg/m   Physical Exam  Ortho Exam Left knee exam shows no joint effusion.  Normal range of motion.  Minimal medial joint line tenderness. Specialty Comments:  No specialty comments available.  Imaging: No results  found.   PMFS History: Patient Active Problem List   Diagnosis Date Noted  . Chronic pain of left knee 03/17/2018  . Primary osteoarthritis of left knee 03/17/2018  . Pain in finger of both hands 06/17/2017  . Osteoarthritis of fingers of hands, bilateral 06/17/2017  . Arthralgia 02/23/2017  . Vitamin D deficiency 02/11/2017  . Hypocalcemia 02/11/2017  . Renal dysfunction 07/26/2012  . Fibroid uterus 12/26/2011   Past Medical History:  Diagnosis Date  . Arthritis   . Chronic kidney disease    right kidney  . Fibroid    FIBROID UTERUS  . NSVD (normal spontaneous vaginal delivery)    X 2  . NSVD (normal spontaneous vaginal delivery)    X 2  . SAB (spontaneous abortion)   . SAB (spontaneous abortion)   . Vitamin D deficiency     No family history on file.  No past surgical history on file. Social History   Occupational History  . 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 Smoking status: Never Smoker  . Smokeless tobacco: Never Used  Substance and Sexual Activity  . Alcohol use: No    Comment: OCCASIONALLY  . Drug use: No  . Sexual activity: Not Currently  Partners: Male    Birth control/protection: Rhythm

## 2018-09-24 ENCOUNTER — Ambulatory Visit
Admission: RE | Admit: 2018-09-24 | Discharge: 2018-09-24 | Disposition: A | Discharge status: Home / Self Care | Payer: BLUE CROSS/BLUE SHIELD | Source: Ambulatory Visit | Attending: Emergency Medicine | Admitting: Emergency Medicine

## 2018-09-24 DIAGNOSIS — Z1231 Encounter for screening mammogram for malignant neoplasm of breast: Secondary | ICD-10-CM | POA: Diagnosis not present

## 2019-06-13 ENCOUNTER — Encounter: Payer: Self-pay | Admitting: Emergency Medicine

## 2019-06-13 ENCOUNTER — Ambulatory Visit: Payer: BC Managed Care – PPO | Admitting: Emergency Medicine

## 2019-06-13 ENCOUNTER — Other Ambulatory Visit: Payer: Self-pay

## 2019-06-13 VITALS — BP 162/84 | HR 61 | Temp 98.0°F | Resp 16 | Ht 60.0 in | Wt 130.8 lb

## 2019-06-13 DIAGNOSIS — Z8639 Personal history of other endocrine, nutritional and metabolic disease: Secondary | ICD-10-CM

## 2019-06-13 DIAGNOSIS — M25562 Pain in left knee: Secondary | ICD-10-CM

## 2019-06-13 DIAGNOSIS — M130 Polyarthritis, unspecified: Secondary | ICD-10-CM

## 2019-06-13 DIAGNOSIS — M1712 Unilateral primary osteoarthritis, left knee: Secondary | ICD-10-CM

## 2019-06-13 DIAGNOSIS — I10 Essential (primary) hypertension: Secondary | ICD-10-CM | POA: Diagnosis not present

## 2019-06-13 DIAGNOSIS — R079 Chest pain, unspecified: Secondary | ICD-10-CM

## 2019-06-13 DIAGNOSIS — G8929 Other chronic pain: Secondary | ICD-10-CM

## 2019-06-13 DIAGNOSIS — Z23 Encounter for immunization: Secondary | ICD-10-CM | POA: Diagnosis not present

## 2019-06-13 LAB — COMPREHENSIVE METABOLIC PANEL
ALT: 15 IU/L (ref 0–32)
AST: 18 IU/L (ref 0–40)
Albumin/Globulin Ratio: 1.6 (ref 1.2–2.2)
Albumin: 4.1 g/dL (ref 3.8–4.9)
Alkaline Phosphatase: 90 IU/L (ref 39–117)
BUN/Creatinine Ratio: 19 (ref 9–23)
BUN: 11 mg/dL (ref 6–24)
Bilirubin Total: 0.3 mg/dL (ref 0.0–1.2)
CO2: 23 mmol/L (ref 20–29)
Calcium: 8.6 mg/dL — ABNORMAL LOW (ref 8.7–10.2)
Chloride: 104 mmol/L (ref 96–106)
Creatinine, Ser: 0.58 mg/dL (ref 0.57–1.00)
GFR calc Af Amer: 123 mL/min/{1.73_m2} (ref >59)
GFR calc non Af Amer: 107 mL/min/{1.73_m2} (ref >59)
Globulin, Total: 2.5 g/dL (ref 1.5–4.5)
Glucose: 105 mg/dL — ABNORMAL HIGH (ref 65–99)
Potassium: 3.8 mmol/L (ref 3.5–5.2)
Sodium: 142 mmol/L (ref 134–144)
Total Protein: 6.6 g/dL (ref 6.0–8.5)

## 2019-06-13 LAB — LIPID PANEL
Chol/HDL Ratio: 3.2 ratio (ref 0.0–4.4)
Cholesterol, Total: 197 mg/dL (ref 100–199)
HDL: 62 mg/dL (ref >39)
LDL Chol Calc (NIH): 122 mg/dL — ABNORMAL HIGH (ref 0–99)
Triglycerides: 71 mg/dL (ref 0–149)
VLDL Cholesterol Cal: 13 mg/dL (ref 5–40)

## 2019-06-13 LAB — HEMOGLOBIN A1C
Est. average glucose Bld gHb Est-mCnc: 131 mg/dL
Hgb A1c MFr Bld: 6.2 % — ABNORMAL HIGH (ref 4.8–5.6)

## 2019-06-13 MED ORDER — LISINOPRIL 20 MG PO TABS
20.0000 mg | ORAL_TABLET | Freq: Every day | ORAL | 3 refills | Status: DC
Start: 2019-06-13 — End: 2020-07-05

## 2019-06-13 NOTE — Progress Notes (Addendum)
BP Readings from Last 3 Encounters:  06/13/19 (!) 192/90  08/12/18 (!) 156/71  07/06/18 128/86    Lab Results  Component Value Date   CREATININE 0.56 07/06/2018   BUN 21 07/06/2018   NA 138 07/06/2018   K 4.1 07/06/2018   CL 107 07/06/2018   CO2 26 07/06/2018    Lab Results  Component Value Date   CHOL 212 (H) 07/06/2018   HDL 58 07/06/2018   LDLCALC 137 (H) 07/06/2018   TRIG 78 07/06/2018   CHOLHDL 3.7 07/06/2018     Autumn Haley 53 y.o.   Chief Complaint  Patient presents with  . Hypertension  . Chest Pain    per patient for 2 months    HISTORY OF PRESENT ILLNESS: This is a 53 y.o. female with history of osteoarthritis of multiple joints noticed her blood pressure has been elevated for the past several months.  Noted to have high blood pressure during recent doctor visits.  Not on medication at present time.  Has family history of hypertension.  Patient works 2 jobs.  Has been getting intermittent episodes of left-sided chest pressure of variable duration at rest or at times on exertion not associated with other symptoms.  Has no history of cardiac conditions.  Non-smoker.  Not diabetic.  No other complaints or medical concerns.  HPI   Prior to Admission medications   Medication Sig Start Date End Date Taking? Authorizing Provider  Ascorbic Acid (VITAMIN C) 1000 MG tablet Take 1,000 mg by mouth daily.   Yes [provider]  diclofenac (VOLTAREN) 75 MG EC tablet Take 75 mg by mouth 2 (two) times daily.   Yes [provider]  Zinc 50 MG CAPS Take by mouth daily.   Yes [provider]  Calcium Carb-Cholecalciferol (CALCIUM 600+D3 PO) Take by mouth daily.    [provider]  Cholecalciferol (VITAMIN D3 PO) Take by mouth daily.    [provider]  etodolac (LODINE) 400 MG tablet Take 1 tablet (400 mg total) by mouth 2 (two) times daily as needed. 08/23/18   Hilts, Legrand Como, MD    Allergies  Allergen Reactions  .  Levaquin [Levofloxacin] Rash    Patient Active Problem List   Diagnosis Date Noted  . Chronic pain of left knee 03/17/2018  . Primary osteoarthritis of left knee 03/17/2018  . Pain in finger of both hands 06/17/2017  . Osteoarthritis of fingers of hands, bilateral 06/17/2017  . Arthralgia 02/23/2017  . Vitamin D deficiency 02/11/2017  . Hypocalcemia 02/11/2017  . Renal dysfunction 07/26/2012  . Fibroid uterus 12/26/2011    Past Medical History:  Diagnosis Date  . Arthritis   . Chronic kidney disease    right kidney  . Fibroid    FIBROID UTERUS  . NSVD (normal spontaneous vaginal delivery)    X 2  . NSVD (normal spontaneous vaginal delivery)    X 2  . SAB (spontaneous abortion)   . SAB (spontaneous abortion)   . Vitamin D deficiency     History reviewed. No pertinent surgical history.  Social History   Socioeconomic History  . Marital status: Married    Spouse name: Donnie Aho  . Number of children: 2  . Years of education: 57  . Highest education level: Not on file  Occupational History  . Occupation: Educational psychologist: [place]  Social Needs  . Financial resource strain: Not on file  . Food insecurity  Worry: Not on file    Inability: Not on file  . Transportation needs    Medical: Not on file    Non-medical: Not on file  Tobacco Use  . Smoking status: Never Smoker  . Smokeless tobacco: Never Used  Substance and Sexual Activity  . Alcohol use: No    Comment: OCCASIONALLY  . Drug use: No  . Sexual activity: Not Currently    Partners: Male    Birth control/protection: Rhythm  Lifestyle  . Physical activity    Days per week: Not on file    Minutes per session: Not on file  . Stress: Not on file  Relationships  . Social Herbalist on phone: Not on file    Gets together: Not on file    Attends religious service: Not on file    Active member of club or organization: Not on file    Attends  meetings of clubs or organizations: Not on file    Relationship status: Not on file  . Intimate partner violence    Fear of current or ex partner: Not on file    Emotionally abused: Not on file    Physically abused: Not on file    Forced sexual activity: Not on file  Other Topics Concern  . Not on file  Social History Narrative   ** Merged History Encounter **       From [place], [place].  Lives here with her husband and 2 children.    History reviewed. No pertinent family history.   Review of Systems  Constitutional: Negative.  Negative for chills and fever.  HENT: Negative.  Negative for congestion and sore throat.   Respiratory: Negative.  Negative for cough and shortness of breath.   Cardiovascular: Positive for chest pain. Negative for palpitations and leg swelling.  Gastrointestinal: Negative.  Negative for abdominal pain, diarrhea, nausea and vomiting.  Genitourinary: Negative.  Negative for dysuria.  Musculoskeletal: Positive for joint pain (Chronic). Negative for back pain and neck pain.  Skin: Negative.  Negative for rash.  Neurological: Positive for headaches. Negative for dizziness, sensory change, focal weakness and weakness.  Endo/Heme/Allergies: Negative.   All other systems reviewed and are negative.  Vitals:   06/13/19 0850 06/13/19 1004  BP: (!) 192/90 (!) 162/84  Pulse: 61   Resp: 16   Temp: 98 F (36.7 C)   SpO2: 97%      Physical Exam Vitals signs reviewed.  Constitutional:      Appearance: Normal appearance. She is well-developed.  HENT:     Head: Normocephalic.  Eyes:     Extraocular Movements: Extraocular movements intact.     Conjunctiva/sclera: Conjunctivae normal.     Pupils: Pupils are equal, round, and reactive to light.  Neck:     Musculoskeletal: Normal range of motion and neck supple.  Cardiovascular:     Rate and Rhythm: Normal rate and regular rhythm.     Pulses: Normal pulses.     Heart sounds: Normal heart sounds.   Pulmonary:     Effort: Pulmonary effort is normal.     Breath sounds: Normal breath sounds.  Abdominal:     Palpations: Abdomen is soft.     Tenderness: There is no abdominal tenderness.  Musculoskeletal: Normal range of motion.     Right lower leg: No edema.     Left lower leg: No edema.  Lymphadenopathy:     Cervical: No cervical adenopathy.  Skin:    General: Skin  is warm and dry.     Capillary Refill: Capillary refill takes less than 2 seconds.  Neurological:     General: No focal deficit present.     Mental Status: She is alert and oriented to person, place, and time.  Psychiatric:        Mood and Affect: Mood normal.        Behavior: Behavior normal.    EKG: Normal sinus rhythm with ventricular rate of 61.  No acute ischemic changes.  Normal EKG.  A total of 40 minutes was spent in the room with the patient, greater than 50% of which was in counseling/coordination of care regarding differential diagnosis of chest pain, review of EKG and old records including most recent blood work, hypertension and cardiovascular risk associated with this, need for hypertension medication, discussion of side effects, monitoring of blood pressure at home, diet and nutrition in particular DASH diet, need for further cardiology evaluation and diagnostic work-up, prognosis and need for follow-up.   ASSESSMENT & PLAN:  Melvia was seen today for hypertension and chest pain.  Diagnoses and all orders for this visit:  Nonspecific chest pain -     Ambulatory referral to Cardiology -     EKG 12-Lead -     Comprehensive metabolic panel  Essential hypertension -     lisinopril (ZESTRIL) 20 MG tablet; Take 1 tablet (20 mg total) by mouth daily. -     Comprehensive metabolic panel -     Hemoglobin A1c  History of high cholesterol -     Lipid panel  Primary osteoarthritis of left knee  Polyarthritis  Chronic pain of left knee  Need for prophylactic vaccination and inoculation against  influenza -     Flu Vaccine QUAD 36+ mos IM    Patient Instructions       If you have lab work done today you will be contacted with your lab results within the next 2 weeks.  If you have not heard from [place] then please contact us. The fastest way to get your results is to register for My Chart.   IF you received an x-ray today, you will receive an invoice from [facility]. Please contact [facility] at [phone] with questions or concerns regarding your invoice.   IF you received labwork today, you will receive an invoice from [facility]. Please contact [facility] at [phone] with questions or concerns regarding your invoice.   Our billing staff will not be able to assist you with questions regarding bills from these companies.  You will be contacted with the lab results as soon as they are available. The fastest way to get your results is to activate your My Chart account. Instructions are located on the last page of this paperwork. If you have not heard from [place] regarding the results in 2 weeks, please contact this office.     Plan de alimentacin DASH DASH Eating Plan DASH es la sigla en ingls de "Enfoques Alimentarios para Detener la Hipertensin" (Dietary Approaches to Stop Hypertension). El plan de alimentacin DASH ha demostrado bajar la presin arterial elevada (hipertensin). Tambin puede reducir [employer] de diabetes tipo 2, enfermedad cardaca y accidente cerebrovascular. Este plan tambin puede ayudar a Horticulturist, commercial. Consejos para seguir este plan  Pautas generales  Evite ingerir ms de 2,300 mg (miligramos) de sal (sodio) por da. Si tiene hipertensin, es posible que necesite reducir la ingesta de sodio a 1,500 mg por da.  Limite el consumo de alcohol a no  ms de 31mdida por da si es mujer y no est [place] y 265midas por da si es hombre. Una medida equivale a 12oz (35555mde cerveza, 5oz (148m23me vino o 1oz (44ml55m bebidas  alcohlicas de alta graduacin.  Trabaje con su mdico para mantener un peso saludable o perder peso.[place] Mediagntele cul es el peso recomendado para usted.  Realice al menos 30 minutos de ejercicio que haga que se acelere su corazn (ejercicio aerbiArboriculturistmayorHartford Financiala semanBurtas actividades pueden incluir caminar, nadar o andar en bicicleta.  Trabaje con su mdico o especialista en alimentacin y nutricin (nutricionista) para ajustar su plan alimentario a sus necesidades calricas personales. Lectura de las etiquetas de los alimentos   Verifique en las etiquetas de los alimentos, la cantidad de sodio por porcin. Elija alimentos con menos del 5 por ciento del valor diario de sodio. Generalmente, los alimentos con menos de 300 mg de sodio por porcin se encuadran dentro de este plan alimentario.  Para encontrar cereales integrales, busque la palabra "integral" como primera palabra en la lista de ingredientes. De compras  Compre productos en los que en su etiqueta diga: "bajo contenido de sodio" o "sin agregado de sal".  Compre alimentos frescos. Evite los alimentos enlatados y comidas precocidas o congeladas. Coccin  Evite agregar sal cuando cocine. Use hierbas o aderezos sin sal, en lugar de sal de mesa o sal marina. Consulte al mdico o farmacutico antes de usar sustitutos de la sal.  No fra los alimentos. A la hora de cocinar los alimentos opte por hornearlos, hervirlos, grillarlos y asarlos a la paAdministrator, artscine con aceites cardiosaludables, como oliva, canola, soja o girasol. Planificacin de las comidas  Consuma una dieta equilibrada, que incluya lo siguiente: ? 5o ms porciones de frutas y verduSet designerte de que la mitad del plato de cada comida sean frutas y verduras. ? Hasta 6 u 8 porciones de cereales integrales por da. ? Menos de 6 onzas de carne, aves o pescado magroGames developer porcin de 3 onzas de carne tiene casi el mismo tamao que un mazo  de cartas. Un huevo equivale a 1 onza. ? Dos porciones de productos lcteos descremados por da. ?Training and development officerna porcin de frutos secos, semillas o frijoles 5 veces por semana. ? Grasas cardiosaludables. Las grasas saludables llamadas cidos grasos omega-3 se encuentran en alimentos como semillas de lino y pescados de agua fra, como por ejemplo, sardinas, salmn y caballa.  Limite la cantidad que ingiere de los siguientes alimentos: ? Alimentos enlatados o envasados. ? Alimentos con alto contenido de grasa trans, como alimentos fritos. ? Alimentos con alto contenido de grasa saturada, como carne con grasa. ? Dulces, postres, bebidas azucaradas y otros alimentos con azcar agregada. ? Productos lcteos enteros.  No le agregue sal a los alimentos antes de probarlos.  Trate de comer al menos 2 comidas vegetarianas por semana.  Consuma ms comida casera y menos de restaurante, de bufs y comida rpida.  Cuando coma en un restaurante, pida que preparen su comida con menos sal o, en lo posible, sin nada de sal. Qu alimentos se recomiendan? Los alimentos enumerados a continuacin no constituyen una lFurniture conservator/restorerle con el nutricionista sobre las mejores opciones alimenticias para usted. Cereales Pan de salvado o integral. Pasta de salvado o integral. Arroz integral. Avena. Quinua. Trigo burgol. Cereales integrales y con bajo contenido de sodio. Pan pita. Galletitas de agua con bajo contenido de grasaDjibouti  sodio. Tortillas de [place] integral. Verduras Verduras frescas o congeladas (crudas, al vapor, asadas o grilladas). Jugos de tomate y verduras con bajo contenido de sodio o reducidos en sodio. Salsa y pasta de tomate con bajo contenido de sodio o reducidas en sodio. Verduras enlatadas con bajo contenido de sodio o reducidas en sodio. Frutas Todas las frutas frescas, congeladas o disecadas. Frutas enlatadas en jugo natural (sin agregado de azcar). Carne y otros alimentos proteicos Pollo o pavo sin  piel. Carne de pollo o de [place]. Cerdo desgrasado. Pescado y [employer]. Claras de huevo. Porotos, guisantes o lentejas secos. Frutos secos, mantequilla de frutos secos y semillas sin sal. Frijoles enlatados sin sal. Cortes de carne vacuna magra, desgrasada. Embutidos magros, con bajo contenido de [place]. Lcteos Leche descremada (1%) o descremada. Quesos sin grasa, con bajo contenido de grasa o descremados. Queso blanco o ricota sin grasa, con bajo contenido de [place]. Yogur semidescremado o descremado. Queso con bajo contenido de [place] y [place]. Grasas y [employer] untables que no contengan grasas trans. Aceite vegetal. Lubertha Basque y aderezos para ensaladas livianos o con bajo contenido de grasas (reducidos en sodio). Aceite de canola, crtamo, oliva, soja y [place]. [place]. Condimentos y otros alimentos Hierbas. Especias. Mezclas de condimentos sin sal. Palomitas de maz y pretzels sin sal. Dulces con bajo contenido de grasas. Qu alimentos no se recomiendan? Los alimentos enumerados a continuacin no constituyen Furniture conservator/restorer. Hable con el nutricionista sobre las mejores opciones alimenticias para usted. Cereales Productos de panificacin hechos con grasa, como medialunas, magdalenas y algunos panes. Comidas con arroz o pasta seca listas para usar. Verduras Verduras con crema o fritas. Verduras en [place]. Verduras enlatadas regulares (que no sean con bajo contenido de sodio o reducidas en sodio). Pasta y salsa de tomates enlatadas regulares (que no sean con bajo contenido de sodio o reducidas en sodio). Jugos de tomate y verduras regulares (que no sean con bajo contenido de sodio o reducidos en sodio). Pepinillos. Aceitunas. Lambert Mody Fruta enlatada en almbar liviano o espeso. Frutas cocidas en aceite. Frutas con salsa de crema o [place]. Carne y otros alimentos proteicos Cortes de carne con grasa. Costillas. Carne frita. [place]. Salchichas. Mortadela y otras carnes  procesadas. Salame. Panceta. Perros calientes (hotdogs). [place]. Frutos secos y semillas con sal. Frijoles enlatados con agregado de sal. Pescado enlatado o ahumado. Huevos enteros o yemas. Pollo o pavo con piel. Lcteos Leche entera o al 2%, crema y mitad leche y mitad crema. Queso crema entero o con toda su grasa. Yogur entero o endulzado. Quesos con toda su grasa. Sustitutos de cremas no lcteas. Coberturas batidas. Quesos para untar y quesos procesados. Grasas y [employer]. Margarina en barra. [place]. Materia grasa. Mantequilla clarificada. Grasa de panceta. Aceites tropicales como aceite de coco, palmiste o palma. Condimentos y otros alimentos Palomitas de maz y pretzels con sal. Sal de cebolla, sal de ajo, sal condimentada, sal de mesa y sal marina. Salsa [place]. Salsa trtara. Salsa barbacoa. Salsa teriyaki. Salsa de soja, incluso la que tiene contenido reducido de [place]. Salsa de carne. Salsas en lata y envasadas. Salsa de pescado. Salsa de [place]. Salsa rosada. Rbano picante envasado. [place]. [place]. Saborizantes y tiernizantes para carne. Caldo en cubitos. Salsa picante y salsa tabasco. Escabeches envasados o ya preparados. Aderezos para tacos prefabricados o envasados. Salsas. Aderezos comunes para ensalada. Dnde encontrar ms informacin:  Cicero, los Pulmones y Herbalist (National Heart, Lung, and [employer]): https://wilson-eaton.com/  Asociacin Estadounidense del Corazn (American Heart Association): www.heart.org Resumen  El plan de alimentacin DASH ha demostrado bajar la presin arterial elevada (hipertensin). Tambin puede reducir UnitedHealth de diabetes tipo 2, enfermedad cardaca y accidente cerebrovascular.  Con el plan de alimentacin DASH, deber limitar el consumo de sal (sodio) a 2,300 mg por da. Si tiene hipertensin, es posible que necesite reducir la ingesta de sodio a 1,500 mg por da.  Cuando siga el  plan de alimentacin DASH, trate de comer ms frutas frescas y verduras, cereales integrales, carnes magras, lcteos descremados y grasas cardiosaludables.  Trabaje con su mdico o especialista en alimentacin y nutricin (nutricionista) para ajustar su plan alimentario a sus necesidades calricas personales. Esta informacin no tiene Marine scientist el consejo del mdico. Asegrese de hacerle al mdico cualquier pregunta que tenga. Document Released: 07/31/2011 Document Revised: 12/01/2016 Document Reviewed: 12/01/2016 Elsevier Patient Education  2020 Elsevier Inc.     Agustina Caroli, MD [facility]

## 2019-06-13 NOTE — Patient Instructions (Addendum)
If you have lab work done today you will be contacted with your lab results within the next 2 weeks.  If you have not heard from [place] then please contact us. The fastest way to get your results is to register for My Chart.   IF you received an x-ray today, you will receive an invoice from [facility]. Please contact [facility] at [phone] with questions or concerns regarding your invoice.   IF you received labwork today, you will receive an invoice from [place]. Please contact [facility] at [phone] with questions or concerns regarding your invoice.   Our billing staff will not be able to assist you with questions regarding bills from these companies.  You will be contacted with the lab results as soon as they are available. The fastest way to get your results is to activate your My Chart account. Instructions are located on the last page of this paperwork. If you have not heard from [place] regarding the results in 2 weeks, please contact this office.     Plan de alimentacin DASH DASH Eating Plan DASH es la sigla en ingls de "Enfoques Alimentarios para Detener la Hipertensin" (Dietary Approaches to Stop Hypertension). El plan de alimentacin DASH ha demostrado bajar la presin arterial elevada (hipertensin). Tambin puede reducir [employer] de diabetes tipo 2, enfermedad cardaca y accidente cerebrovascular. Este plan tambin puede ayudar a Horticulturist, commercial. Consejos para seguir este plan  Pautas generales  Evite ingerir ms de 2,300 mg (miligramos) de sal (sodio) por da. Si tiene hipertensin, es posible que necesite reducir la ingesta de sodio a 1,500 mg por da.  Limite el consumo de alcohol a no ms de 20mdida por da si es mujer y no est [place] y 269midas por da si es hombre. Una medida equivale a 12oz (35524mde cerveza, 5oz (148m75me vino o 1oz (44ml39m bebidas alcohlicas de alta graduacin.  Trabaje con su mdico para mantener un peso  saludable o perder peso.[place] Mediagntele cul es el peso recomendado para usted.  Realice al menos 30 minutos de ejercicio que haga que se acelere su corazn (ejercicio aerbiArboriculturistmayorHartford Financiala semanCasas Adobesas actividades pueden incluir caminar, nadar o andar en bicicleta.  Trabaje con su mdico o especialista en alimentacin y nutricin (nutricionista) para ajustar su plan alimentario a sus necesidades calricas personales. Lectura de las etiquetas de los alimentos   Verifique en las etiquetas de los alimentos, la cantidad de sodio por porcin. Elija alimentos con menos del 5 por ciento del valor diario de sodio. Generalmente, los alimentos con menos de 300 mg de sodio por porcin se encuadran dentro de este plan alimentario.  Para encontrar cereales integrales, busque la palabra "integral" como primera palabra en la lista de ingredientes. De compras  Compre productos en los que en su etiqueta diga: "bajo contenido de sodio" o "sin agregado de sal".  Compre alimentos frescos. Evite los alimentos enlatados y comidas precocidas o congeladas. Coccin  Evite agregar sal cuando cocine. Use hierbas o aderezos sin sal, en lugar de sal de mesa o sal marina. Consulte al mdico o farmacutico antes de usar sustitutos de la sal.  No fra los alimentos. A la hora de cocinar los alimentos opte por hornearlos, hervirlos, grillarlos y asarlos a la paAdministrator, artscine con aceites cardiosaludables, como oliva, canola, soja o girasol. Planificacin de las comidas  Consuma una dieta equilibrada, que incluya lo siguiente: ? 5o ms porciones de frutas y verduras por  da. Trate de que la mitad del plato de cada comida sean frutas y verduras. ? Hasta 6 u 8 porciones de cereales integrales por da. ? Menos de 6 onzas de carne, aves o pescado Games developer. Una porcin de 3 onzas de carne tiene casi el mismo tamao que un mazo de cartas. Un huevo equivale a 1 onza. ? Dos porciones de productos lcteos  descremados por Training and development officer. ? Una porcin de frutos secos, semillas o frijoles 5 veces por semana. ? Grasas cardiosaludables. Las grasas saludables llamadas cidos grasos omega-3 se encuentran en alimentos como semillas de lino y pescados de agua fra, como por ejemplo, sardinas, salmn y caballa.  Limite la cantidad que ingiere de los siguientes alimentos: ? Alimentos enlatados o envasados. ? Alimentos con alto contenido de grasa trans, como alimentos fritos. ? Alimentos con alto contenido de grasa saturada, como carne con grasa. ? Dulces, postres, bebidas azucaradas y otros alimentos con azcar agregada. ? Productos lcteos enteros.  No le agregue sal a los alimentos antes de probarlos.  Trate de comer al menos 2 comidas vegetarianas por semana.  Consuma ms comida casera y menos de restaurante, de bufs y comida rpida.  Cuando coma en un restaurante, pida que preparen su comida con menos sal o, en lo posible, sin nada de sal. Qu alimentos se recomiendan? Los alimentos enumerados a continuacin no constituyen Furniture conservator/restorer. Hable con el nutricionista sobre las mejores opciones alimenticias para usted. Cereales Pan de salvado o integral. Pasta de salvado o integral. Arroz integral. Avena. Quinua. Trigo burgol. Cereales integrales y con bajo contenido de sodio. Pan pita. Galletitas de [place] con bajo contenido de [place] y [place]. Tortillas de [place] integral. Verduras Verduras frescas o congeladas (crudas, al vapor, asadas o grilladas). Jugos de tomate y verduras con bajo contenido de sodio o reducidos en sodio. Salsa y pasta de tomate con bajo contenido de sodio o reducidas en sodio. Verduras enlatadas con bajo contenido de sodio o reducidas en sodio. Frutas Todas las frutas frescas, congeladas o disecadas. Frutas enlatadas en jugo natural (sin agregado de azcar). Carne y otros alimentos proteicos Pollo o pavo sin piel. Carne de pollo o de [place]. Cerdo desgrasado. Pescado y [employer].  Claras de huevo. Porotos, guisantes o lentejas secos. Frutos secos, mantequilla de frutos secos y semillas sin sal. Frijoles enlatados sin sal. Cortes de carne vacuna magra, desgrasada. Embutidos magros, con bajo contenido de [place]. Lcteos Leche descremada (1%) o descremada. Quesos sin grasa, con bajo contenido de grasa o descremados. Queso blanco o ricota sin grasa, con bajo contenido de [place]. Yogur semidescremado o descremado. Queso con bajo contenido de [place] y [place]. Grasas y [employer] untables que no contengan grasas trans. Aceite vegetal. Lubertha Basque y aderezos para ensaladas livianos o con bajo contenido de grasas (reducidos en sodio). Aceite de canola, crtamo, oliva, soja y [place]. [place]. Condimentos y otros alimentos Hierbas. Especias. Mezclas de condimentos sin sal. Palomitas de maz y pretzels sin sal. Dulces con bajo contenido de grasas. Qu alimentos no se recomiendan? Los alimentos enumerados a continuacin no constituyen Furniture conservator/restorer. Hable con el nutricionista sobre las mejores opciones alimenticias para usted. Cereales Productos de panificacin hechos con grasa, como medialunas, magdalenas y algunos panes. Comidas con arroz o pasta seca listas para usar. Verduras Verduras con crema o fritas. Verduras en [place]. Verduras enlatadas regulares (que no sean con bajo contenido de sodio o reducidas en sodio). Pasta y salsa de tomates enlatadas regulares (que no sean  con bajo contenido de sodio o reducidas en sodio). Jugos de tomate y verduras regulares (que no sean con bajo contenido de sodio o reducidos en sodio). Pepinillos. Aceitunas. Lambert Mody Fruta enlatada en almbar liviano o espeso. Frutas cocidas en aceite. Frutas con salsa de crema o [place]. Carne y otros alimentos proteicos Cortes de carne con grasa. Costillas. Carne frita. [place]. Salchichas. Mortadela y otras carnes procesadas. Salame. Panceta. Perros calientes (hotdogs). Ansted. Frutos  secos y semillas con sal. Frijoles enlatados con agregado de sal. Pescado enlatado o ahumado. Huevos enteros o yemas. Pollo o pavo con piel. Lcteos Leche entera o al 2%, crema y mitad leche y mitad crema. Queso crema entero o con toda su grasa. Yogur entero o endulzado. Quesos con toda su grasa. Sustitutos de cremas no lcteas. Coberturas batidas. Quesos para untar y quesos procesados. Grasas y [employer]. Margarina en barra. [place]. Materia grasa. Mantequilla clarificada. Grasa de panceta. Aceites tropicales como aceite de coco, palmiste o palma. Condimentos y otros alimentos Palomitas de maz y pretzels con sal. Sal de cebolla, sal de ajo, sal condimentada, sal de mesa y sal marina. Salsa [place]. Salsa trtara. Salsa barbacoa. Salsa teriyaki. Salsa de soja, incluso la que tiene contenido reducido de [place]. Salsa de carne. Salsas en lata y envasadas. Salsa de pescado. Salsa de [place]. Salsa rosada. Rbano picante envasado. [place]. [place]. Saborizantes y tiernizantes para carne. Caldo en cubitos. Salsa picante y salsa tabasco. Escabeches envasados o ya preparados. Aderezos para tacos prefabricados o envasados. Salsas. Aderezos comunes para ensalada. Dnde encontrar ms informacin:  [place], los Pulmones y Herbalist (National Heart, Lung, and [place]): https://wilson-eaton.com/  Asociacin [place]ounidense del [place] (American Heart Association): www.heart.org Resumen  El plan de alimentacin DASH ha demostrado bajar la presin arterial elevada (hipertensin). Tambin puede reducir [employer] de diabetes tipo 2, enfermedad cardaca y accidente cerebrovascular.  Con el plan de alimentacin DASH, deber limitar el consumo de sal (sodio) a 2,300 mg por da. Si tiene hipertensin, es posible que necesite reducir la ingesta de sodio a 1,500 mg por da.  Cuando siga el plan de alimentacin DASH, trate de comer ms frutas frescas y verduras, cereales  integrales, carnes magras, lcteos descremados y grasas cardiosaludables.  Trabaje con su mdico o especialista en alimentacin y nutricin (nutricionista) para ajustar su plan alimentario a sus necesidades calricas personales. Esta informacin no tiene Marine scientist el consejo del mdico. Asegrese de hacerle al mdico cualquier pregunta que tenga. Document Released: 07/31/2011 Document Revised: 12/01/2016 Document Reviewed: 12/01/2016 Elsevier Patient Education  2020 [employer].

## 2019-06-28 ENCOUNTER — Encounter: Payer: Self-pay | Admitting: Cardiology

## 2019-06-28 ENCOUNTER — Other Ambulatory Visit: Payer: Self-pay

## 2019-06-28 ENCOUNTER — Ambulatory Visit: Payer: BC Managed Care – PPO | Admitting: Cardiology

## 2019-06-28 VITALS — BP 142/86 | HR 55 | Temp 97.3°F | Ht 65.0 in | Wt 131.0 lb

## 2019-06-28 DIAGNOSIS — R079 Chest pain, unspecified: Secondary | ICD-10-CM

## 2019-06-28 DIAGNOSIS — Z01812 Encounter for preprocedural laboratory examination: Secondary | ICD-10-CM | POA: Diagnosis not present

## 2019-06-28 DIAGNOSIS — R7303 Prediabetes: Secondary | ICD-10-CM | POA: Diagnosis not present

## 2019-06-28 DIAGNOSIS — I10 Essential (primary) hypertension: Secondary | ICD-10-CM

## 2019-06-28 MED ORDER — METOPROLOL TARTRATE 25 MG PO TABS: ORAL_TABLET | ORAL | 0 refills | Status: DC

## 2019-06-28 NOTE — Patient Instructions (Signed)
Medication Instructions:  TAKE METOPROLOL 25 MG 2 HOURS PRIOR TO CARDIAC CT  *If you need a refill on your cardiac medications before your next appointment, please call your pharmacy*  Lab Work: BMET 1 WEEK PRIOR TO CT  If you have labs (blood work) drawn today and your tests are completely normal, you will receive your results only by: Marland Kitchen MyChart Message (if you have MyChart) OR . A paper copy in the mail If you have any lab test that is abnormal or we need to change your treatment, we will call you to review the results.  Testing/Procedures: Your physician has requested that you have cardiac CT. Cardiac computed tomography (CT) is a painless test that uses an x-ray machine to take clear, detailed pictures of your heart. For further information please visit [email or web address]. Please follow instruction sheet as given. THE OFFICE WILL CALL YOU TO SCHEDULE ONCE YOUR INSURANCE HAS APPROVED  Your physician has requested that you have an echocardiogram. Echocardiography is a painless test that uses sound waves to create images of your heart. It provides your doctor with information about the size and shape of your heart and how well your heart's chambers and valves are working. This procedure takes approximately one hour. There are no restrictions for this procedure. [address]  Follow-Up: At [facility], you and your health needs are our priority.  As part of our continuing mission to provide you with exceptional heart care, we have created designated Provider Care Teams.  These Care Teams include your primary Cardiologist (physician) and Advanced Practice Providers (APPs -  Physician Assistants and Nurse Practitioners) who all work together to provide you with the care you need, when you need it.  Your next appointment:   AS NEEDED   Other Instructions  Your cardiac CT will be scheduled at one of the below locations:   [facility] [address] [place], [place] [address] [phone]  [place] [address] [place],  [address] [phone]  If scheduled at [facility], please arrive at the [facility] main entrance of [facility] 30-45 minutes prior to test start time. Proceed to the [facility] Radiology Department (first floor) to check-in and test prep.  If scheduled at [facility], please arrive 15 mins early for check-in and test prep.  Please follow these instructions carefully (unless otherwise directed):  Hold all erectile dysfunction medications at least 3 days (72 hrs) prior to test.  On the Night Before the Test: . Be sure to Drink plenty of water. . Do not consume any caffeinated/decaffeinated beverages or chocolate 12 hours prior to your test. . Do not take any antihistamines 12 hours prior to your test. . If the patient has contrast allergy: ? Patient will need a prescription for Prednisone and very clear instructions (as follows): 1. Prednisone 50 mg - take 13 hours prior to test 2. Take another Prednisone 50 mg 7 hours prior to test 3. Take another Prednisone 50 mg 1 hour prior to test 4. Take Benadryl 50 mg 1 hour prior to test . Patient must complete all four doses of above prophylactic medications. . Patient will need a ride after test due to Benadryl.  On the Day of the Test: . Drink plenty of water. Do not drink any water within one hour of the test. . Do not eat any food 4 hours prior to  the test. . You may take your regular medications prior to the test.  . Take metoprolol (Lopressor) two hours prior to test. . HOLD Furosemide/Hydrochlorothiazide morning of the test. . FEMALES- please wear underwire-free bra if available   *For Clinical Staff only. Please instruct patient the following:*        -Drink plenty of water       -Hold Furosemide/hydrochlorothiazide morning of the test        -Take metoprolol (Lopressor) 2 hours prior to test (if applicable).                  -If HR is less than 55 BPM- No Beta Blocker                -IF HR is greater than 55 BPM and patient is less than or equal to 30 yrs old Lopressor 152m x1.                -If HR is greater than 55 BPM and patient is greater than 749yrs old Lopressor 50 mg x1.     Do not give Lopressor to patients with an allergy to lopressor or anyone with asthma or active COPD symptoms (currently taking steroids).       After the Test: . Drink plenty of water. . After receiving IV contrast, you may experience a mild flushed feeling. This is normal. . On occasion, you may experience a mild rash up to 24 hours after the test. This is not dangerous. If this occurs, you can take Benadryl 25 mg and increase your fluid intake. . If you experience trouble breathing, this can be serious. If it is severe call 911 IMMEDIATELY. If it is mild, please call our office. . If you take any of these medications: Glipizide/Metformin, Avandament, Glucavance, please do not take 48 hours after completing test unless otherwise instructed.   Once we have confirmed authorization from your insurance company, we will call you to set up a date and time for your test.   For non-scheduling related questions, please contact the cardiac imaging nurse navigator should you have any questions/concerns: SMarchia Bond RN Navigator Cardiac Imaging [facility] [phone]    Cardiac CT Angiogram  A cardiac CT angiogram is a procedure to look at the heart and the area around the heart. It may be done to help find the cause of chest pains or other symptoms of heart disease. During this procedure, a large X-ray machine, called a CT scanner, takes detailed pictures of the heart and the surrounding area after a dye (contrast material) has been injected into blood vessels in the area. The procedure is also sometimes called a  coronary CT angiogram, coronary artery scanning, or CTA. A cardiac CT angiogram allows the health care provider to see how well blood is flowing to and from the heart. The health care provider will be able to see if there are any problems, such as:  Blockage or narrowing of the coronary arteries in the heart.  Fluid around the heart.  Signs of weakness or disease in the muscles, valves, and tissues of the heart. Tell a health care provider about:  Any allergies you have. This is especially important if you have had a previous allergic reaction to contrast dye.  All medicines you are taking, including vitamins, herbs, eye drops, creams, and over-the-counter medicines.  Any blood disorders you have.  Any surgeries you have had.  Any medical conditions you  have.  Whether you are pregnant or may be pregnant.  Any anxiety disorders, chronic pain, or other conditions you have that may increase your stress or prevent you from lying still. What are the risks? Generally, this is a safe procedure. However, problems may occur, including:  Bleeding.  Infection.  Allergic reactions to medicines or dyes.  Damage to other structures or organs.  Kidney damage from the dye or contrast that is used.  Increased risk of cancer from radiation exposure. This risk is low. Talk with your health care provider about: ? The risks and benefits of testing. ? How you can receive the lowest dose of radiation. What happens before the procedure?  Wear comfortable clothing and remove any jewelry, glasses, dentures, and hearing aids.  Follow instructions from your health care provider about eating and drinking. This may include: ? For 12 hours before the test - avoid caffeine. This includes tea, coffee, soda, energy drinks, and diet pills. Drink plenty of water or other fluids that do not have caffeine in them. Being well-hydrated can prevent complications. ? For 4-6 hours before the test - stop eating and  drinking. The contrast dye can cause nausea, but this is less likely if your stomach is empty.  Ask your health care provider about changing or stopping your regular medicines. This is especially important if you are taking diabetes medicines, blood thinners, or medicines to treat erectile dysfunction. What happens during the procedure?  Hair on your chest may need to be removed so that small sticky patches called electrodes can be placed on your chest. These will transmit information that helps to monitor your heart during the test.  An IV tube will be inserted into one of your veins.  You might be given a medicine to control your heart rate during the test. This will help to ensure that good images are obtained.  You will be asked to lie on an exam table. This table will slide in and out of the CT machine during the procedure.  Contrast dye will be injected into the IV tube. You might feel warm, or you may get a metallic taste in your mouth.  You will be given a medicine (nitroglycerin) to relax (dilate) the arteries in your heart.  The table that you are lying on will move into the CT machine tunnel for the scan.  The person running the machine will give you instructions while the scans are being done. You may be asked to: ? Keep your arms above your head. ? Hold your breath. ? Stay very still, even if the table is moving.  When the scanning is complete, you will be moved out of the machine.  The IV tube will be removed. The procedure may vary among health care providers and hospitals. What happens after the procedure?  You might feel warm, or you may get a metallic taste in your mouth from the contrast dye.  You may have a headache from the nitroglycerin.  After the procedure, drink water or other fluids to wash (flush) the contrast material out of your body.  Contact a health care provider if you have any symptoms of allergy to the contrast. These symptoms include: ?  Shortness of breath. ? Rash or hives. ? A racing heartbeat.  Most people can return to their normal activities right after the procedure. Ask your health care provider what activities are safe for you.  It is up to you to get the results of your procedure. Ask  your health care provider, or the department that is doing the procedure, when your results will be ready. Summary  A cardiac CT angiogram is a procedure to look at the heart and the area around the heart. It may be done to help find the cause of chest pains or other symptoms of heart disease.  During this procedure, a large X-ray machine, called a CT scanner, takes detailed pictures of the heart and the surrounding area after a dye (contrast material) has been injected into blood vessels in the area.  Ask your health care provider about changing or stopping your regular medicines before the procedure. This is especially important if you are taking diabetes medicines, blood thinners, or medicines to treat erectile dysfunction.  After the procedure, drink water or other fluids to wash (flush) the contrast material out of your body. This information is not intended to replace advice given to you by your health care provider. Make sure you discuss any questions you have with your health care provider. Document Released: 07/24/2008 Document Revised: 07/24/2017 Document Reviewed: 06/30/2016 Elsevier Patient Education  [employer].   Echocardiogram An echocardiogram is a procedure that uses painless sound waves (ultrasound) to produce an image of the heart. Images from an echocardiogram can provide important information about:  Signs of coronary artery disease (CAD).  Aneurysm detection. An aneurysm is a weak or damaged part of an artery wall that bulges out from the normal force of blood pumping through the body.  Heart size and shape. Changes in the size or shape of the heart can be associated with certain conditions, including  heart failure, aneurysm, and CAD.  Heart muscle function.  Heart valve function.  Signs of a past heart attack.  Fluid buildup around the heart.  Thickening of the heart muscle.  A tumor or infectious growth around the heart valves. Tell a health care provider about:  Any allergies you have.  All medicines you are taking, including vitamins, herbs, eye drops, creams, and over-the-counter medicines.  Any blood disorders you have.  Any surgeries you have had.  Any medical conditions you have.  Whether you are pregnant or may be pregnant. What are the risks? Generally, this is a safe procedure. However, problems may occur, including:  Allergic reaction to dye (contrast) that may be used during the procedure. What happens before the procedure? No specific preparation is needed. You may eat and drink normally. What happens during the procedure?   An IV tube may be inserted into one of your veins.  You may receive contrast through this tube. A contrast is an injection that improves the quality of the pictures from your heart.  A gel will be applied to your chest.  A wand-like tool (transducer) will be moved over your chest. The gel will help to transmit the sound waves from the transducer.  The sound waves will harmlessly bounce off of your heart to allow the heart images to be captured in real-time motion. The images will be recorded on a computer. The procedure may vary among health care providers and hospitals. What happens after the procedure?  You may return to your normal, everyday life, including diet, activities, and medicines, unless your health care provider tells you not to do that. Summary  An echocardiogram is a procedure that uses painless sound waves (ultrasound) to produce an image of the heart.  Images from an echocardiogram can provide important information about the size and shape of your heart, heart muscle function, heart  valve function, and fluid  buildup around your heart.  You do not need to do anything to prepare before this procedure. You may eat and drink normally.  After the echocardiogram is completed, you may return to your normal, everyday life, unless your health care provider tells you not to do that. This information is not intended to replace advice given to you by your health care provider. Make sure you discuss any questions you have with your health care provider. Document Released: 08/08/2000 Document Revised: 12/02/2018 Document Reviewed: 09/13/2016 Elsevier Patient Education  2020 [employer].

## 2019-06-28 NOTE — Progress Notes (Signed)
Cardiology Office Note:    Date:  06/28/2019   ID:  Autumn Haley, DOB 1966-01-12, MRN [id number]  PCP:  Autumn Pollen, MD  Cardiologist:  No primary care provider on file.  Electrophysiologist:  None   Referring MD: Autumn Haley, *   Chief Complaint  Patient presents with  . New Patient (Initial Visit)  . Chest Pain    History of Present Illness:    Autumn Haley is a 53 y.o. female with a hx of osteoarthritis, hypertension who is referred by Dr. Mitchel Haley for evaluation of chest pain.  She reports that chest pain started 2 months ago.  Describes as pressure in the center of her chest.  Occurring 2-3 times per week, can last for hours.  She has not noted any relationship with exertion.  She reports that she walks on the treadmill for 20 minutes, denies any chest pain.  No dyspnea with exertion.  She has never smoked.  No history of heart disease in her immediate family.   Past Medical History:  Diagnosis Date  . Arthritis   . Chronic kidney disease    right kidney  . Fibroid    FIBROID UTERUS  . NSVD (normal spontaneous vaginal delivery)    X 2  . NSVD (normal spontaneous vaginal delivery)    X 2  . SAB (spontaneous abortion)   . SAB (spontaneous abortion)   . Vitamin D deficiency     History reviewed. No pertinent surgical history.  Current Medications: Current Meds  Medication Sig  . Ascorbic Acid (VITAMIN C) 1000 MG tablet Take 1,000 mg by mouth daily.  . Calcium Carb-Cholecalciferol (CALCIUM 600+D3 PO) Take by mouth daily.  . Cholecalciferol (VITAMIN D3 PO) Take 1,000 Units by mouth daily.   . diclofenac (VOLTAREN) 75 MG EC tablet Take 75 mg by mouth 2 (two) times daily.  Marland Kitchen lisinopril (ZESTRIL) 20 MG tablet Take 1 tablet (20 mg total) by mouth daily.  . Zinc 50 MG CAPS Take by mouth daily.     Allergies:   Levaquin [levofloxacin]   Social History   Socioeconomic History  . Marital status: Married    Spouse name: Autumn Haley  .  Number of children: 2  . Years of education: 65  . Highest education level: Not on file  Occupational History  . 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 Financial resource strain: Not on file  . Food insecurity    Worry: Not on file    Inability: Not on file  . Transportation needs    Medical: Not on file    Non-medical: Not on file  Tobacco Use  . Smoking status: Never Smoker  . Smokeless tobacco: Never Used  Substance and Sexual Activity  . Alcohol use: No    Comment: OCCASIONALLY  . Drug use: No  . Sexual activity: Not Currently    Partners: Male    Birth control/protection: Rhythm  Lifestyle  . Physical activity    Days per week: Not on file    Minutes per session: Not on file  . Stress: Not on file  Relationships  . Social Herbalist on phone: Not on file    Gets together: Not on file    Attends religious service: Not on file    Active member of club or organization: Not on file    Attends meetings of clubs or organizations: Not on file  Relationship status: Not on file  Other Topics Concern  . Not on file  Social History Narrative   ** Merged History Encounter **       From [place], [place].  Lives here with her husband and 2 children.     Family History:  No history of heart disease in her immediate family.  ROS:   Please see the history of present illness.     All other systems reviewed and are negative.  EKGs/Labs/Other Studies Reviewed:    The following studies were reviewed today:   EKG:  EKG is  ordered today.  The ekg ordered today demonstrates sinus bradycardia, rate 55, no ST/T wave abnormality  Recent Labs: 07/06/2018: Hemoglobin 12.8; Platelets 288; TSH 1.32 06/13/2019: ALT 15; BUN 11; Creatinine, Ser 0.58; Potassium 3.8; Sodium 142  Recent Lipid Panel    Component Value Date/Time   CHOL 197 06/13/2019 1000   TRIG 71 06/13/2019 1000   HDL 62 06/13/2019 1000    CHOLHDL 3.2 06/13/2019 1000   CHOLHDL 3.7 07/06/2018 1153   VLDL 11 12/08/2016 0848   LDLCALC 122 (H) 06/13/2019 1000   LDLCALC 137 (H) 07/06/2018 1153    Physical Exam:    VS:  BP (!) 142/86 (BP Location: Left Arm, Patient Position: Sitting, Cuff Size: Normal)   Pulse (!) 55   Temp (!) 97.3 F (36.3 C)   Ht 5' 5"  (1.651 m)   Wt 131 lb (59.4 kg)   LMP 02/19/2017   BMI 21.80 kg/m     Wt Readings from Last 3 Encounters:  06/28/19 131 lb (59.4 kg)  06/13/19 130 lb 12.8 oz (59.3 kg)  09/21/18 136 lb (61.7 kg)     GEN:  Well nourished, well developed in no acute distress HEENT: Normal NECK: No JVD; No carotid bruits LYMPHATICS: No lymphadenopathy CARDIAC: RRR, no murmurs, rubs, gallops RESPIRATORY:  Clear to auscultation without rales, wheezing or rhonchi  ABDOMEN: Soft, non-tender, non-distended MUSCULOSKELETAL:  No edema; No deformity  SKIN: Warm and dry NEUROLOGIC:  Alert and oriented x 3 PSYCHIATRIC:  Normal affect   ASSESSMENT:    1. Chest pain of uncertain etiology   2. Pre-procedure lab exam   3. Essential hypertension   4. Prediabetes    PLAN:    In order of problems listed above:  Chest pain: Atypical in description as describes substernal chest pressure, but does not appear to be related to exertion.  Given age and risk factors, would classify as intermediate risk for obstructive coronary disease and warrants further evaluation -Coronary CTA -TTE  Hypertension: Recently started on lisinopril 20 mg daily, continue to monitor BP  Prediabetes: A1c 6.2   Medication Adjustments/Labs and Tests Ordered: Current medicines are reviewed at length with the patient today.  Concerns regarding medicines are outlined above.  Orders Placed This Encounter  Procedures  . CT CORONARY MORPH W/CTA COR W/SCORE W/CA W/CM &/OR WO/CM  . CT CORONARY FRACTIONAL FLOW RESERVE DATA PREP  . CT CORONARY FRACTIONAL FLOW RESERVE FLUID ANALYSIS  . Basic metabolic panel  . EKG  12-Lead  . ECHOCARDIOGRAM COMPLETE   Meds ordered this encounter  Medications  . metoprolol tartrate (LOPRESSOR) 25 MG tablet    Sig: TAKE 1 TABLET 2 HOURS PRIOR TO CT    Dispense:  1 tablet    Refill:  0    Patient Instructions  Medication Instructions:  TAKE METOPROLOL 25 MG 2 HOURS PRIOR TO CARDIAC CT  *If you need a refill on your cardiac  medications before your next appointment, please call your pharmacy*  Lab Work: BMET 1 WEEK PRIOR TO CT  If you have labs (blood work) drawn today and your tests are completely normal, you will receive your results only by: Marland Kitchen MyChart Message (if you have MyChart) OR . A paper copy in the mail If you have any lab test that is abnormal or we need to change your treatment, we will call you to review the results.  Testing/Procedures: Your physician has requested that you have cardiac CT. Cardiac computed tomography (CT) is a painless test that uses an x-ray machine to take clear, detailed pictures of your heart. For further information please visit [email or web address]. Please follow instruction sheet as given. THE OFFICE WILL CALL YOU TO SCHEDULE ONCE YOUR INSURANCE HAS APPROVED  Your physician has requested that you have an echocardiogram. Echocardiography is a painless test that uses sound waves to create images of your heart. It provides your doctor with information about the size and shape of your heart and how well your heart's chambers and valves are working. This procedure takes approximately one hour. There are no restrictions for this procedure. [address]  Follow-Up: At [facility], you and your health needs are our priority.  As part of our continuing mission to provide you with exceptional heart care, we have created designated Provider Care Teams.  These Care Teams include your primary Cardiologist (physician) and Advanced Practice Providers (APPs -  Physician Assistants and Nurse Practitioners) who  all work together to provide you with the care you need, when you need it.  Your next appointment:   AS NEEDED   Other Instructions  Your cardiac CT will be scheduled at one of the below locations:   [facility] [address] [place], [place] [address] [phone]  [place] [address] [place], [place] [address] [phone]  If scheduled at [facility] [place], please arrive at the [facility] main entrance of [facility] 30-45 minutes prior to test start time. Proceed to the [facility] Radiology Department (first floor) to check-in and test prep.  If scheduled at [facility], please arrive 15 mins early for check-in and test prep.  Please follow these instructions carefully (unless otherwise directed):  Hold all erectile dysfunction medications at least 3 days (72 hrs) prior to test.  On the Night Before the Test: . Be sure to Drink plenty of water. . Do not consume any caffeinated/decaffeinated beverages or chocolate 12 hours prior to your test. . Do not take any antihistamines 12 hours prior to your test. . If the patient has contrast allergy: ? Patient will need a prescription for Prednisone and very clear instructions (as follows): 1. Prednisone 50 mg - take 13 hours prior to test 2. Take another Prednisone 50 mg 7 hours prior to test 3. Take another Prednisone 50 mg 1 hour prior to test 4. Take Benadryl 50 mg 1 hour prior to test . Patient must complete all four doses of above prophylactic medications. . Patient will need a ride after test due to Benadryl.  On the Day of the Test: . Drink plenty of water. Do not drink any water within one hour of the test. . Do not eat any food 4 hours prior to the test. . You may take your regular medications prior to the test.  . Take metoprolol (Lopressor) two hours prior to  test. . HOLD Furosemide/Hydrochlorothiazide  morning of the test. . FEMALES- please wear underwire-free bra if available   *For Clinical Staff only. Please instruct patient the following:*        -Drink plenty of water       -Hold Furosemide/hydrochlorothiazide morning of the test       -Take metoprolol (Lopressor) 2 hours prior to test (if applicable).                  -If HR is less than 55 BPM- No Beta Blocker                -IF HR is greater than 55 BPM and patient is less than or equal to 18 yrs old Lopressor 142m x1.                -If HR is greater than 55 BPM and patient is greater than 7109yrs old Lopressor 50 mg x1.     Do not give Lopressor to patients with an allergy to lopressor or anyone with asthma or active COPD symptoms (currently taking steroids).       After the Test: . Drink plenty of water. . After receiving IV contrast, you may experience a mild flushed feeling. This is normal. . On occasion, you may experience a mild rash up to 24 hours after the test. This is not dangerous. If this occurs, you can take Benadryl 25 mg and increase your fluid intake. . If you experience trouble breathing, this can be serious. If it is severe call 911 IMMEDIATELY. If it is mild, please call our office. . If you take any of these medications: Glipizide/Metformin, Avandament, Glucavance, please do not take 48 hours after completing test unless otherwise instructed.   Once we have confirmed authorization from your insurance company, we will call you to set up a date and time for your test.   For non-scheduling related questions, please contact the cardiac imaging nurse navigator should you have any questions/concerns: SMarchia Bond RN Navigator Cardiac Imaging [facility] [phone]    Cardiac CT Angiogram  A cardiac CT angiogram is a procedure to look at the heart and the area around the heart. It may be done to help find the cause of chest pains or other symptoms of heart disease. During  this procedure, a large X-ray machine, called a CT scanner, takes detailed pictures of the heart and the surrounding area after a dye (contrast material) has been injected into blood vessels in the area. The procedure is also sometimes called a coronary CT angiogram, coronary artery scanning, or CTA. A cardiac CT angiogram allows the health care provider to see how well blood is flowing to and from the heart. The health care provider will be able to see if there are any problems, such as:  Blockage or narrowing of the coronary arteries in the heart.  Fluid around the heart.  Signs of weakness or disease in the muscles, valves, and tissues of the heart. Tell a health care provider about:  Any allergies you have. This is especially important if you have had a previous allergic reaction to contrast dye.  All medicines you are taking, including vitamins, herbs, eye drops, creams, and over-the-counter medicines.  Any blood disorders you have.  Any surgeries you have had.  Any medical conditions you have.  Whether you are pregnant or may be pregnant.  Any anxiety disorders, chronic pain, or other conditions you have that  may increase your stress or prevent you from lying still. What are the risks? Generally, this is a safe procedure. However, problems may occur, including:  Bleeding.  Infection.  Allergic reactions to medicines or dyes.  Damage to other structures or organs.  Kidney damage from the dye or contrast that is used.  Increased risk of cancer from radiation exposure. This risk is low. Talk with your health care provider about: ? The risks and benefits of testing. ? How you can receive the lowest dose of radiation. What happens before the procedure?  Wear comfortable clothing and remove any jewelry, glasses, dentures, and hearing aids.  Follow instructions from your health care provider about eating and drinking. This may include: ? For 12 hours before the test - avoid  caffeine. This includes tea, coffee, soda, energy drinks, and diet pills. Drink plenty of water or other fluids that do not have caffeine in them. Being well-hydrated can prevent complications. ? For 4-6 hours before the test - stop eating and drinking. The contrast dye can cause nausea, but this is less likely if your stomach is empty.  Ask your health care provider about changing or stopping your regular medicines. This is especially important if you are taking diabetes medicines, blood thinners, or medicines to treat erectile dysfunction. What happens during the procedure?  Hair on your chest may need to be removed so that small sticky patches called electrodes can be placed on your chest. These will transmit information that helps to monitor your heart during the test.  An IV tube will be inserted into one of your veins.  You might be given a medicine to control your heart rate during the test. This will help to ensure that good images are obtained.  You will be asked to lie on an exam table. This table will slide in and out of the CT machine during the procedure.  Contrast dye will be injected into the IV tube. You might feel warm, or you may get a metallic taste in your mouth.  You will be given a medicine (nitroglycerin) to relax (dilate) the arteries in your heart.  The table that you are lying on will move into the CT machine tunnel for the scan.  The person running the machine will give you instructions while the scans are being done. You may be asked to: ? Keep your arms above your head. ? Hold your breath. ? Stay very still, even if the table is moving.  When the scanning is complete, you will be moved out of the machine.  The IV tube will be removed. The procedure may vary among health care providers and hospitals. What happens after the procedure?  You might feel warm, or you may get a metallic taste in your mouth from the contrast dye.  You may have a headache from the  nitroglycerin.  After the procedure, drink water or other fluids to wash (flush) the contrast material out of your body.  Contact a health care provider if you have any symptoms of allergy to the contrast. These symptoms include: ? Shortness of breath. ? Rash or hives. ? A racing heartbeat.  Most people can return to their normal activities right after the procedure. Ask your health care provider what activities are safe for you.  It is up to you to get the results of your procedure. Ask your health care provider, or the department that is doing the procedure, when your results will be ready. Summary  A cardiac  CT angiogram is a procedure to look at the heart and the area around the heart. It may be done to help find the cause of chest pains or other symptoms of heart disease.  During this procedure, a large X-ray machine, called a CT scanner, takes detailed pictures of the heart and the surrounding area after a dye (contrast material) has been injected into blood vessels in the area.  Ask your health care provider about changing or stopping your regular medicines before the procedure. This is especially important if you are taking diabetes medicines, blood thinners, or medicines to treat erectile dysfunction.  After the procedure, drink water or other fluids to wash (flush) the contrast material out of your body. This information is not intended to replace advice given to you by your health care provider. Make sure you discuss any questions you have with your health care provider. Document Released: 07/24/2008 Document Revised: 07/24/2017 Document Reviewed: 06/30/2016 Elsevier Patient Education  [place].   Echocardiogram An echocardiogram is a procedure that uses painless sound waves (ultrasound) to produce an image of the heart. Images from an echocardiogram can provide important information about:  Signs of coronary artery disease (CAD).  Aneurysm detection. An aneurysm  is a weak or damaged part of an artery wall that bulges out from the normal force of blood pumping through the body.  Heart size and shape. Changes in the size or shape of the heart can be associated with certain conditions, including heart failure, aneurysm, and CAD.  Heart muscle function.  Heart valve function.  Signs of a past heart attack.  Fluid buildup around the heart.  Thickening of the heart muscle.  A tumor or infectious growth around the heart valves. Tell a health care provider about:  Any allergies you have.  All medicines you are taking, including vitamins, herbs, eye drops, creams, and over-the-counter medicines.  Any blood disorders you have.  Any surgeries you have had.  Any medical conditions you have.  Whether you are pregnant or may be pregnant. What are the risks? Generally, this is a safe procedure. However, problems may occur, including:  Allergic reaction to dye (contrast) that may be used during the procedure. What happens before the procedure? No specific preparation is needed. You may eat and drink normally. What happens during the procedure?   An IV tube may be inserted into one of your veins.  You may receive contrast through this tube. A contrast is an injection that improves the quality of the pictures from your heart.  A gel will be applied to your chest.  A wand-like tool (transducer) will be moved over your chest. The gel will help to transmit the sound waves from the transducer.  The sound waves will harmlessly bounce off of your heart to allow the heart images to be captured in real-time motion. The images will be recorded on a computer. The procedure may vary among health care providers and hospitals. What happens after the procedure?  You may return to your normal, everyday life, including diet, activities, and medicines, unless your health care provider tells you not to do that. Summary  An echocardiogram is a procedure that  uses painless sound waves (ultrasound) to produce an image of the heart.  Images from an echocardiogram can provide important information about the size and shape of your heart, heart muscle function, heart valve function, and fluid buildup around your heart.  You do not need to do anything to prepare before this procedure.  You may eat and drink normally.  After the echocardiogram is completed, you may return to your normal, everyday life, unless your health care provider tells you not to do that. This information is not intended to replace advice given to you by your health care provider. Make sure you discuss any questions you have with your health care provider. Document Released: 08/08/2000 Document Revised: 12/02/2018 Document Reviewed: 09/13/2016 Elsevier Patient Education  2020 [place], Donato Heinz, MD  06/28/2019 10:29 AM    [place]

## 2019-07-05 ENCOUNTER — Other Ambulatory Visit: Payer: Self-pay

## 2019-07-05 ENCOUNTER — Ambulatory Visit (HOSPITAL_COMMUNITY): Payer: BC Managed Care – PPO | Attending: Internal Medicine

## 2019-07-05 DIAGNOSIS — Z01812 Encounter for preprocedural laboratory examination: Secondary | ICD-10-CM

## 2019-08-03 ENCOUNTER — Encounter: Payer: Self-pay | Admitting: Obstetrics & Gynecology

## 2019-08-03 ENCOUNTER — Other Ambulatory Visit: Payer: Self-pay

## 2019-08-03 ENCOUNTER — Ambulatory Visit (INDEPENDENT_AMBULATORY_CARE_PROVIDER_SITE_OTHER): Payer: BC Managed Care – PPO | Admitting: Obstetrics & Gynecology

## 2019-08-03 VITALS — BP 134/78 | Ht 60.0 in | Wt 131.0 lb

## 2019-08-03 DIAGNOSIS — Z01419 Encounter for gynecological examination (general) (routine) without abnormal findings: Secondary | ICD-10-CM | POA: Diagnosis not present

## 2019-08-03 DIAGNOSIS — Z780 Asymptomatic menopausal state: Secondary | ICD-10-CM | POA: Diagnosis not present

## 2019-08-03 DIAGNOSIS — R87615 Unsatisfactory cytologic smear of cervix: Secondary | ICD-10-CM

## 2019-08-03 NOTE — Addendum Note (Signed)
Addended by: Thurnell Garbe A on: 08/03/2019 10:00 AM   Modules accepted: Orders

## 2019-08-03 NOTE — Patient Instructions (Signed)
1. Encounter for routine gynecological examination with Papanicolaou smear of cervix Normal gynecologic exam.  Pap test November 2019 was negative but insufficient transitional cells, Pap reflex repeated this year.  Breast exam normal.  Screening mammogram - January 2020.  Colonoscopy to schedule, referring to gastroenterologist.  Health labs with family physician.  Body mass index improved to 25.58 this year.  Continue with healthy nutrition and aerobic activities 5 times a week with weightlifting every 2 days.    2. Postmenopause Well on no hormone replacement therapy.  No postmenopausal bleeding.  Recommend vitamin D supplements, calcium intake of 1200 mg daily and regular weightbearing physical activities.  3. Encounter for repeat Pap smear due to previous insuff cervical cells Pap reflex done today.  [place], fue un placer verle hoy!  Voy a informarle de sus [employer].

## 2019-08-03 NOTE — Progress Notes (Signed)
Autumn Haley March 24, 1966 [id number]   History:    53 y.o. G3P2A1L2 Separated, husband back in [place].  Son is 69 yo.  Daughter is 58 yo, recent Dx of skin cancer.  RP:  Established patient presenting for annual gyn exam   HPI: Menopause at age 47 yo, well on no HRT.  No postmenopausal bleeding.  No pelvic pain.  Currently abstinent.  Urine and bowel movements normal.  Breasts normal.  Body mass index improved x last yr, now at 25.58  Will do fasting health labs here today.  Past medical history,surgical history, family history and social history were all reviewed and documented in the EPIC chart.  Gynecologic History Patient's last menstrual period was 02/19/2017.  Obstetric History OB History  Gravida Para Term Preterm AB Living  3 2 2  0 1 2  SAB TAB Ectopic Multiple Live Births  1 0 0   2    # Outcome Date GA Lbr Len/2nd Weight Sex Delivery Anes PTL Lv  3 SAB           2 Term     F Vag-Spont  N LIV  1 Term     M Vag-Spont  N LIV     ROS: A ROS was performed and pertinent positives and negatives are included in the history.  GENERAL: No fevers or chills. HEENT: No change in vision, no earache, sore throat or sinus congestion. NECK: No pain or stiffness. CARDIOVASCULAR: No chest pain or pressure. No palpitations. PULMONARY: No shortness of breath, cough or wheeze. GASTROINTESTINAL: No abdominal pain, nausea, vomiting or diarrhea, melena or bright red blood per rectum. GENITOURINARY: No urinary frequency, urgency, hesitancy or dysuria. MUSCULOSKELETAL: No joint or muscle pain, no back pain, no recent trauma. DERMATOLOGIC: No rash, no itching, no lesions. ENDOCRINE: No polyuria, polydipsia, no heat or cold intolerance. No recent change in weight. HEMATOLOGICAL: No anemia or easy bruising or bleeding. NEUROLOGIC: No headache, seizures, numbness, tingling or weakness. PSYCHIATRIC: No depression, no loss of interest in normal activity or change in sleep pattern.     Exam:    BP 134/78   Ht 5' (1.524 m)   Wt 131 lb (59.4 kg)   LMP 02/19/2017   BMI 25.58 kg/m   Body mass index is 25.58 kg/m.  General appearance : Well developed well nourished female. No acute distress HEENT: Eyes: no retinal hemorrhage or exudates,  Neck supple, trachea midline, no carotid bruits, no thyroidmegaly Lungs: Clear to auscultation, no rhonchi or wheezes, or rib retractions  Heart: Regular rate and rhythm, no murmurs or gallops Breast:Examined in sitting and supine position were symmetrical in appearance, no palpable masses or tenderness,  no skin retraction, no nipple inversion, no nipple discharge, no skin discoloration, no axillary or supraclavicular lymphadenopathy Abdomen: no palpable masses or tenderness, no rebound or guarding Extremities: no edema or skin discoloration or tenderness  Pelvic: Vulva: Normal             Vagina: No gross lesions or discharge  Cervix: No gross lesions or discharge.  Pap reflex done.  Uterus  AV, normal size, shape and consistency, non-tender and mobile  Adnexa  Without masses or tenderness  Anus: Normal   Assessment/Plan:  53 y.o. female for annual exam   1. Encounter for routine gynecological examination with Papanicolaou smear of cervix Normal gynecologic exam.  Pap test November 2019 was negative but insufficient transitional cells, Pap reflex repeated this year.  Breast exam normal.  Screening mammogram - January  2020.  Colonoscopy to schedule, referring to gastroenterologist.  Health labs with family physician.  Body mass index improved to 25.58 this year.  Continue with healthy nutrition and aerobic activities 5 times a week with weightlifting every 2 days.    2. Postmenopause Well on no hormone replacement therapy.  No postmenopausal bleeding.  Recommend vitamin D supplements, calcium intake of 1200 mg daily and regular weightbearing physical activities.  3. Encounter for repeat Pap smear due to previous insuff cervical cells Pap  reflex done today.  Princess Bruins MD, 9:19 AM 08/03/2019

## 2019-08-04 LAB — PAP IG W/ RFLX HPV ASCU

## 2019-08-22 DIAGNOSIS — R079 Chest pain, unspecified: Secondary | ICD-10-CM | POA: Diagnosis not present

## 2019-08-22 DIAGNOSIS — Z01812 Encounter for preprocedural laboratory examination: Secondary | ICD-10-CM | POA: Diagnosis not present

## 2019-08-23 ENCOUNTER — Telehealth (HOSPITAL_COMMUNITY): Payer: Self-pay | Admitting: Emergency Medicine

## 2019-08-23 LAB — BASIC METABOLIC PANEL
BUN/Creatinine Ratio: 21 (ref 9–23)
BUN: 11 mg/dL (ref 6–24)
CO2: 24 mmol/L (ref 20–29)
Calcium: 8.5 mg/dL — ABNORMAL LOW (ref 8.7–10.2)
Chloride: 104 mmol/L (ref 96–106)
Creatinine, Ser: 0.53 mg/dL — ABNORMAL LOW (ref 0.57–1.00)
GFR calc Af Amer: 127 mL/min/{1.73_m2} (ref >59)
GFR calc non Af Amer: 110 mL/min/{1.73_m2} (ref >59)
Glucose: 82 mg/dL (ref 65–99)
Potassium: 5 mmol/L (ref 3.5–5.2)
Sodium: 140 mmol/L (ref 134–144)

## 2019-08-23 NOTE — Telephone Encounter (Signed)
Left message on voicemail with name and callback number Tyler Robidoux RN Navigator Cardiac [facility] [phone] Office [phone] Cell  

## 2019-08-24 ENCOUNTER — Ambulatory Visit (HOSPITAL_COMMUNITY)
Admission: RE | Admit: 2019-08-24 | Discharge: 2019-08-24 | Disposition: A | Discharge status: Home / Self Care | Payer: BC Managed Care – PPO | Source: Ambulatory Visit | Attending: Cardiology | Admitting: Cardiology

## 2019-08-24 ENCOUNTER — Other Ambulatory Visit: Payer: Self-pay

## 2019-08-24 DIAGNOSIS — Z01812 Encounter for preprocedural laboratory examination: Secondary | ICD-10-CM

## 2019-08-24 DIAGNOSIS — R079 Chest pain, unspecified: Secondary | ICD-10-CM | POA: Diagnosis not present

## 2019-08-24 MED ORDER — NITROGLYCERIN 0.4 MG SL SUBL
0.8000 mg | SUBLINGUAL_TABLET | Freq: Once | SUBLINGUAL | Status: AC
  Administered 2019-08-24: 0.8 mg via SUBLINGUAL

## 2019-08-24 MED ORDER — NITROGLYCERIN 0.4 MG SL SUBL
SUBLINGUAL_TABLET | SUBLINGUAL | Status: AC
  Filled 2019-08-24: qty 2

## 2019-08-24 MED ORDER — IOHEXOL 350 MG/ML SOLN
100.0000 mL | Freq: Once | INTRAVENOUS | Status: AC | PRN
  Administered 2019-08-24: 100 mL via INTRAVENOUS

## 2019-08-29 ENCOUNTER — Other Ambulatory Visit: Payer: Self-pay

## 2019-08-29 MED ORDER — ATORVASTATIN CALCIUM 20 MG PO TABS: 20.0000 mg | ORAL_TABLET | Freq: Every day | ORAL | 11 refills | Status: DC

## 2020-03-13 ENCOUNTER — Ambulatory Visit (INDEPENDENT_AMBULATORY_CARE_PROVIDER_SITE_OTHER): Payer: Self-pay | Admitting: Nurse Practitioner

## 2020-03-13 ENCOUNTER — Encounter: Payer: Self-pay | Admitting: Nurse Practitioner

## 2020-03-13 VITALS — BP 118/76

## 2020-03-13 DIAGNOSIS — Z711 Person with feared health complaint in whom no diagnosis is made: Secondary | ICD-10-CM

## 2020-03-13 NOTE — Progress Notes (Signed)
   Acute Office Visit  Subjective:    Patient ID: Brycelyn Gambino, female    DOB: 08-18-1966, 54 y.o.   MRN: [id number]   HPI 54 y.o. presents today because she is worried a bug may have went into her vagina a few nights ago. She felt something crawling in her underwear but did not see anything. Denies bite, discomfort, or discharge. She used a douche twice to see if there was anything in there. Spanish-speaking, interpretor present.    Review of Systems  Constitutional: Negative.   Genitourinary: Negative.        Objective:    Physical Exam Constitutional:      Appearance: Normal appearance.  Genitourinary:    General: Normal vulva.     Vagina: Normal.     Cervix: Normal.     Uterus: Normal.      BP 118/76   LMP 02/19/2017  Wt Readings from Last 3 Encounters:  08/03/19 131 lb (59.4 kg)  06/28/19 131 lb (59.4 kg)  06/13/19 130 lb 12.8 oz (59.3 kg)        Assessment & Plan:   Problem List Items Addressed This Visit    None    Visit Diagnoses    Physically well but worried    -  Primary     Plan: Reassurance provided on normal exam. Recommend continuing to sleep with underwear on to prevent future worry. Do not continue to douche. She is agreeable to plan.      Tamela Gammon [facility], 2:47 PM 03/13/2020

## 2020-07-05 ENCOUNTER — Encounter: Payer: Self-pay | Admitting: Family Medicine

## 2020-07-05 ENCOUNTER — Other Ambulatory Visit: Payer: Self-pay

## 2020-07-05 ENCOUNTER — Ambulatory Visit (INDEPENDENT_AMBULATORY_CARE_PROVIDER_SITE_OTHER): Payer: Self-pay | Admitting: Family Medicine

## 2020-07-05 VITALS — BP 170/94 | HR 57 | Temp 98.0°F | Ht 60.0 in | Wt 132.0 lb

## 2020-07-05 DIAGNOSIS — I10 Essential (primary) hypertension: Secondary | ICD-10-CM

## 2020-07-05 DIAGNOSIS — Z8639 Personal history of other endocrine, nutritional and metabolic disease: Secondary | ICD-10-CM

## 2020-07-05 DIAGNOSIS — J309 Allergic rhinitis, unspecified: Secondary | ICD-10-CM

## 2020-07-05 MED ORDER — CETIRIZINE HCL 10 MG PO TABS: 10.0000 mg | ORAL_TABLET | Freq: Every day | ORAL | 4 refills | Status: DC

## 2020-07-05 MED ORDER — ATORVASTATIN CALCIUM 20 MG PO TABS: 20.0000 mg | ORAL_TABLET | Freq: Every day | ORAL | 3 refills | Status: DC

## 2020-07-05 MED ORDER — FLUTICASONE PROPIONATE 50 MCG/ACT NA SUSP: 2.0000 | Freq: Every day | NASAL | 1 refills | Status: DC

## 2020-07-05 MED ORDER — LISINOPRIL 30 MG PO TABS: 30.0000 mg | ORAL_TABLET | Freq: Every day | ORAL | 3 refills | Status: DC

## 2020-07-05 NOTE — Progress Notes (Signed)
11/11/202111:28 AM  Autumn Haley 10-30-1965, 54 y.o., female [id number]  Chief Complaint  Patient presents with  . Hypertension    headaches at times / medication refills     HPI:   Patient is a 54 y.o. 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, academic at [employer] is much improved  Hyperlipidemia Never started atorvastatin Lab Results  Component Value Date   CHOL 197 06/13/2019   HDL 62 06/13/2019   [place] 122 (H) 06/13/2019   TRIG 71 06/13/2019   CHOLHDL 3.2 06/13/2019   The 10-year ASCVD risk score Mikey Bussing DC Jr., et al., 2013) is: 3%   Values used to calculate the score:     Age: 70 years     Sex: Female     Is Non-Hispanic African American: No     Diabetic: No     Tobacco smoker: No     Systolic Blood Pressure: 010 mmHg     Is BP treated: Yes     HDL Cholesterol: 62 mg/dL     Total Cholesterol: 197 mg/dL  Screening Pap test November 2019 was negative but insufficient transitional cells, Pap reflex repeated this year.  Breast exam normal.  Screening mammogram - January 2020.  Colonoscopy to schedule, referring to gastroenterologist.  Health labs with family physician.  Body mass index improved to 25.58 this year.  Continue with healthy nutrition and aerobic activities 5 times a week with weightlifting every 2 days.   Depression screen [facility] 2/9 07/05/2020 06/13/2019 08/12/2018  Decreased Interest 0 0 0  Down, Depressed, Hopeless 0 0 0  PHQ - 2 Score 0 0 0  Altered sleeping - - -  Tired, decreased energy - - -  Change in  appetite - - -  Feeling bad or failure about yourself  - - -  Trouble concentrating - - -  Moving slowly or fidgety/restless - - -  Suicidal thoughts - - -  PHQ-9 Score - - -    Fall Risk  07/05/2020 06/13/2019 08/12/2018 03/17/2018 11/20/2017  Falls in the past year? 0 1 0 No No  Number falls in past yr: 0 0 - - -  Injury with Fall? 0 1 - - -  Comment - left foot - - -  Follow up Falls evaluation completed Falls evaluation completed - - -     Allergies  Allergen Reactions  . Levaquin [Levofloxacin] Rash    Prior to Admission medications   Medication Sig Start Date End Date Taking? Authorizing Provider  Ascorbic Acid (VITAMIN C) 1000 MG tablet Take 1,000 mg by mouth daily.   Yes [provider]  Calcium Carb-Cholecalciferol (CALCIUM 600+D3 PO) Take by mouth daily.   Yes [provider]  Cholecalciferol (VITAMIN D3 PO) Take 1,000 Units by mouth daily.    Yes [provider]  lisinopril (ZESTRIL) 20 MG tablet Take 1 tablet (20 mg total) by mouth daily. 06/13/19  Yes SagardiaInes Bloomer, MD  Zinc 50 MG CAPS Take by mouth daily.    Yes [provider]  atorvastatin (LIPITOR) 20 MG tablet Take 1 tablet (20 mg total) by mouth daily. Patient not taking: Reported on 07/05/2020 08/29/19 11/27/19  Donato Heinz, MD  diclofenac (VOLTAREN) 75 MG EC tablet Take 75 mg by mouth 2 (two) times daily. Patient not taking: Reported on 03/13/2020    [provider]  metoprolol tartrate (LOPRESSOR) 25 MG tablet TAKE 1 TABLET 2 HOURS PRIOR TO CT Patient not taking: Reported on 07/05/2020 06/28/19   Donato Heinz, MD    Past Medical History:  Diagnosis Date  . Arthritis   . Chronic kidney disease    right kidney  . Fibroid    FIBROID UTERUS  . NSVD (normal spontaneous vaginal delivery)    X 2  . NSVD (normal spontaneous vaginal delivery)    X 2  . SAB (spontaneous abortion)   . SAB (spontaneous abortion)   . Vitamin D deficiency      History reviewed. No pertinent surgical history.  Social History   Tobacco Use  . Smoking status: Never Smoker  . Smokeless tobacco: Never Used  Substance Use Topics  . Alcohol use: No    Comment: OCCASIONALLY    History reviewed. No pertinent family history.  Review of Systems  Constitutional: Negative for chills, fever and malaise/fatigue.  HENT: Positive for congestion.   Eyes: Negative for blurred vision and double vision.  Respiratory: Negative for cough, shortness of breath and wheezing.   Cardiovascular: Negative for chest pain, palpitations and leg swelling.  Gastrointestinal: Negative for abdominal pain, blood in stool, constipation, diarrhea, heartburn, nausea and vomiting.  Genitourinary: Negative for dysuria, frequency and hematuria.  Musculoskeletal: Negative for back pain and joint pain.  Skin: Negative for rash.  Neurological: Positive for headaches. Negative for dizziness, tingling, sensory change and weakness.     OBJECTIVE:  Today's Vitals   07/05/20 1022  BP: (!) 170/94  Pulse: (!) 57  Temp: 98 F (36.7 C)  SpO2: 99%  Weight: 132 lb (59.9 kg)  Height: 5' (1.524 m)   Body mass index is 25.78 kg/m.   Physical Exam Constitutional:      General: She is not in acute distress.    Appearance: Normal appearance. She is not ill-appearing.  HENT:     Head: Normocephalic.     Nose: Congestion present.  Cardiovascular:     Rate and Rhythm: Normal rate and regular rhythm.     Pulses: Normal pulses.     Heart sounds: Normal heart sounds. No murmur heard.  No friction rub. No gallop.   Pulmonary:     Effort: Pulmonary effort is normal. No respiratory distress.     Breath sounds: Normal breath sounds. No stridor. No wheezing, rhonchi or rales.  Abdominal:     General: Bowel sounds are normal.     Palpations: Abdomen is soft.     Tenderness: There is no abdominal tenderness.  Musculoskeletal:     Right lower leg: No edema.     Left lower leg:  No edema.  Skin:    General: Skin is warm and dry.  Neurological:     General: No focal deficit present.     Mental Status: She is alert and oriented to person, place, and time.  Psychiatric:        Mood and Affect: Mood normal.        Behavior: Behavior normal.     No results found for this or any previous visit (from the past 24 hour(s)).  No results found.   ASSESSMENT and PLAN  Problem List Items Addressed This Visit    None    Visit Diagnoses    History of high cholesterol    -  Primary   Relevant Medications   atorvastatin (LIPITOR) 20 MG tablet Ordered per Cardiology   Essential hypertension       Relevant Medications   lisinopril (ZESTRIL) 30 MG tablet   atorvastatin (LIPITOR) 20 MG tablet Increased Lisinopril from 20 to 77m Discussed goal < 140/90 Continues to work on LFM Discussed checking BP at home BP Readings from Last 3 Encounters:  07/05/20 (!) 170/94  03/13/20 118/76  08/24/19 (!) 150/93      Allergic rhinitis, unspecified seasonality, unspecified trigger       Relevant Medications   fluticasone (FLONASE) 50 MCG/ACT nasal spray daily   cetirizine (ZYRTEC) 10 MG tablet qhs Waking up with headaches possibly due to congestion or HTN Will follow up next visit     Discussed getting labs, will do at annual physical  Return in about 6 months (around 01/02/2021).    KHuston FoleyJust, FNP-BC [facility] [place] [id number].  [phone] [phone]

## 2020-07-05 NOTE — Patient Instructions (Addendum)
BP goal < 140/90 Take Blood pressure at home daily Cholesterol LDL goal < 100  Increased lisinopril dose sent to pharmacy:  Atorvastatin daily per cardiology  Flonase daily Cetrizine as night for allergies, will make you drowsy   Rinitis alrgica en adultos Allergic Rhinitis, Adult La rinitis alrgica es una reaccin a los alrgenos que se encuentran en el aire. Los alrgenos son las partculas minsculas que estn en el aire y que hacen que el cuerpo tenga una reaccin IT consultant. Esta afeccin no se puede transmitir de [place] persona a otra (no es contagiosa). La rinitis alrgica no se puede curar, pero puede controlarse. [employer] tipos de rinitis alrgica:  Psychologist, counselling. Este tipo tambin se denomina fiebre del heno. Sucede nicamente durante ciertas pocas del ao.  Perenne. Este tipo puede ocurrir en cualquier momento del ao. Cules son las causas? Esta afeccin puede ser causada por lo siguiente:  El polen que proviene de los rboles, el pasto y las [place].  caros del polvo en Engineer, mining.  Caspa de las [employer].  Moho. Cules son los signos o los sntomas? Los sntomas de esta afeccin incluyen:  Estornudos.  Nariz tapada o que gotea (congestin nasal).  Abundante mucosidad en la parte posterior de la garganta (goteo posnasal).  Escozor en la Lawyer.  Lagrimeo.  Dificultad para dormir.  Estar somnoliento Agricultural consultant. Cmo se trata? No hay cura para esta afeccin. Debe evitar las cosas que desencadenan sus sntomas (alrgenos). El tratamiento puede ayudar a [employer] sntomas. Puede incluir:  Medicamentos que [employer] sntomas de la [place], [place] antihistamnicos. Estos pueden administrarse en forma de inyeccin, aerosol nasal o comprimidos.  Vacunas que se administran hasta que el cuerpo se vuelve menos sensible al alrgeno (desensibilizacin).  Medicamentos ms potentes, si todos los dems tratamientos no han sido eficaces. Siga estas  indicaciones en su casa: Evite los alrgenos   Conozca a qu es Air cabin crew. Los alrgenos comunes incluyen el humo, polvo y [place].  Evtelos si puede. Hay algunas medidas que puede tomar para evitar los alrgenos: ? [employer] alfombras por pisos de [place], baldosas o vinilo. Las alfombras pueden retener la caspa de los animales y [place]. ? Limpie cualquier moho que encuentre en la casa. ? No fume. No permita que fumen en su casa. ? Cambie el filtro de la calefaccin y del aire acondicionado al menos una vez al mes. ? Durante la temporada de alergias:  Mantenga las ventanas cerradas todo el tiempo posible. Si es posible, use aire acondicionado cuando hay mucho polen en el aire.  Use un filtro especial para alergias con la caldera y el aire acondicionado.  Planee actividades al aire libre cuando las concentraciones de polen estn en su nivel ms bajo. Normalmente, esto es por la maana temprano o durante las horas de la noche.  Si sale al aire libre cuando la concentracin de polen es [place], use una mscara especial para personas con Set designer.  Cuando vuelva al interior, dese una ducha y [place] de ropa antes de sentarse en los muebles o en la cama. Indicaciones generales  No use ventiladores en su hogar.  No cuelgue ropa en el exterior para que se seque.  Use gafas para el sol para mantener el polen alejado de los ojos.  Lvese las manos enseguida despus de tocar a las [employer].  Tome los medicamentos de venta libre y los recetados solamente como se lo haya indicado el mdico.  Concurra a todas las visitas de control  como se lo haya indicado el mdico. Esto es importante. Comunquese con un mdico si:  Tiene fiebre.  Tiene tos que no desaparece (es persistente).  Comienza a emitir un sonido agudo al respirar (sibilancias).  Los sntomas no mejoran con Dispensing optician.  Le sale lquido espeso por la Lawyer.  Comienza a tener hemorragia nasal. Solicite  ayuda inmediatamente si:  Tiene la boca o los labios hinchados.  Tiene dificultad para respirar.  Se siente mareado o como si se fuera a desmayar.  Tiene transpiracin fra. Resumen  La rinitis alrgica es una reaccin a los alrgenos que se encuentran en el aire.  Esta afeccin puede ser causada por alrgenos. Estos [employer], los caros del polvo, la caspa de las mascotas y Building services engineer.  Los sntomas son goteo y picazn nasal, estornudos o Industrial/product designer. Tambin es posible que tenga dificultad para dormir o que sienta sueo Agricultural consultant.  El tratamiento incluye tomar medicamentos y Management consultant. Tambin es posible que deba recibir vacunas o tomar medicamentos ms potentes.  Solicite ayuda si tiene fiebre o tos que no se detiene. Solicite ayuda de inmediato si le falta el aire. Esta informacin no tiene Marine scientist el consejo del mdico. Asegrese de hacerle al mdico cualquier pregunta que tenga. Document Revised: 04/14/2018 Document Reviewed: 04/14/2018 Elsevier Patient Education  2020 [place] Maintenance, Female Adoptar un estilo de vida saludable y recibir atencin preventiva son importantes para promover la salud y Musician. Consulte al mdico sobre:  El esquema adecuado para hacerse pruebas y exmenes peridicos.  Cosas que puede hacer por su cuenta para prevenir enfermedades y [employer]. Qu debo saber sobre la dieta, el peso y el ejercicio? Consuma una dieta saludable   Consuma una dieta que incluya muchas verduras, frutas, productos lcteos con bajo contenido de [place] y Advertising account planner.  No consuma muchos alimentos ricos en grasas slidas, azcares agregados o sodio. Mantenga un peso saludable El ndice de masa muscular [facility]) se [place] para identificar problemas de [place]. Proporciona una estimacin de la grasa corporal basndose en el peso y la altura. Su mdico puede ayudarle a  Radiation protection practitioner [place] y a Scientist, forensic o Theatre manager un peso saludable. Haga ejercicio con regularidad Haga ejercicio con regularidad. Esta es una de las prcticas ms importantes que puede hacer por su salud. La mayora de los adultos deben seguir estas pautas:  Optometrist, al menos, 175mnutos de actividad fsica por semana. El ejercicio debe aumentar la frecuencia cardaca y hNature conservation officertranspirar (ejercicio de intensidad moderada).  Hacer ejercicios de fortalecimiento por lo mHalliburton Companypor semana. Agregue esto a su plan de ejercicio de intensidad moderada.  Pasar menos tiempo sentados. Incluso la actividad fsica ligera puede ser beneficiosa. Controle sus niveles de colesterol y lpidos en la sangre Comience a realizarse anlisis de lpidos y cResearch officer, trade unionen la sangre a los 20aos y luego reptalos cada 5aos. Hgase controlar los niveles de colesterol con mayor frecuencia si:  Sus niveles de lpidos y colesterol son altos.  Es mayor de 40aos.  Presenta un alto riesgo de padecer enfermedades cardacas. Qu debo saber sobre las pruebas de deteccin del cncer? Segn su historia clnica y sus antecedentes familiares, es posible que deba realizarse pruebas de deteccin del cncer en diferentes edades. Esto puede incluir pruebas de deteccin de lo siguiente:  Cncer de mama.  Cncer de cuello uterino.  Cncer colorrectal.  Cncer de piel.  Cncer de  pulmn. Qu debo saber sobre la enfermedad cardaca, la diabetes y la hipertensin arterial? Presin arterial y enfermedad cardaca  La hipertensin arterial causa enfermedades cardacas y [place] el riesgo de accidente cerebrovascular. Es ms probable que esto se manifieste en las personas que tienen lecturas de presin arterial alta, tienen ascendencia africana o tienen sobrepeso.  Hgase controlar la presin arterial: ? Cada 3 a 5 aos si tiene entre 18 y 76 aos. ? Todos los aos si es mayor de [place]. Diabetes Realcese exmenes de  deteccin de la diabetes con regularidad. Este anlisis revisa el nivel de azcar en la sangre en [place]. Hgase las pruebas de deteccin:  Cada tresaos despus de los 28aos de edad si tiene un peso normal y un bajo riesgo de padecer diabetes.  Con ms frecuencia y a partir de [place] edad inferior si tiene sobrepeso o un alto riesgo de padecer diabetes. Qu debo saber sobre la prevencin de infecciones? Hepatitis B Si tiene un riesgo ms alto de contraer hepatitis B, debe someterse a un examen de deteccin de este virus. Hable con el mdico para averiguar si tiene riesgo de contraer la infeccin por hepatitis B. Hepatitis C Se recomienda el anlisis a:  [employer] 1945 y 1965.  Todas las personas que tengan un riesgo de haber contrado hepatitis C. Enfermedades de transmisin sexual (ETS)  Hgase las pruebas de Programme researcher, broadcasting/film/video de ITS, incluidas la gonorrea y la clamidia, si: ? Es sexualmente activa y es menor de [place]. ? Es mayor de 24aos, y Investment banker, operational informa que corre riesgo de tener este tipo de infecciones. ? La actividad sexual ha cambiado desde que le hicieron la ltima prueba de deteccin y tiene un riesgo mayor de Best boy clamidia o Radio broadcast assistant. Pregntele al mdico si usted tiene riesgo.  Pregntele al mdico si usted tiene un alto riesgo de Museum/gallery curator VIH. El mdico tambin puede recomendarle un medicamento recetado para ayudar a evitar la infeccin por el VIH. Si elige tomar medicamentos para prevenir el VIH, primero debe [employer] de deteccin del VIH. Luego debe hacerse anlisis cada 48mses mientras est tomando los medicamentos. Embarazo  Si est por dejar de mLibrarian, academic(fase premenopusica) y usted puede quedar eDunk[place] busque asesoramiento antes de qBotswana  Tome de 400 a [id number](mcg) de cido fAnheuser-Buschsi qIreland  Pida mtodos de control de la natalidad (anticonceptivos) si desea evitar un embarazo no  deseado. Osteoporosis y mBrazilLa osteoporosis es una enfermedad en la que los huesos pierden los minerales y la fuerza por el avance de la edad. El resultado pueden ser fracturas en los hSublette Si tiene 65aos o ms, o si est en riesgo de sufrir osteoporosis y fracturas, pregunte a su mdico si debe:  Hacerse pruebas de deteccin de prdida sea.  Tomar un suplemento de calcio o de vitamina D para reducir el riesgo de fracturas.  Recibir terapia de reemplazo hormonal (TRH) para tratar los sntomas de la menopausia. Siga estas instrucciones en su casa: Estilo de vida  No consuma ningn producto que contenga nicotina o tabaco, como cigarrillos, cigarrillos electrnicos y tabaco de mHigher education careers adviser Si necesita ayuda para dejar de fumar, consulte al mdico.  No consuma drogas.  No comparta agujas.  Solicite ayuda a su mdico si necesita apoyo o informacin para abandonar las drogas. Consumo de alcohol  No beba alcohol si: ? Su mdico le indica no hacerlo. ? Est embarazada, puede estar embarazada o est tratando de quedar embarazada.  Si bebe alcohol: ? Limite la cantidad que consume de 0 a 1 medida por da. ? Limite la ingesta si est amamantando.  Est atento a la cantidad de alcohol que hay en las bebidas que toma. En los [place], una medida equivale a una botella de cerveza de 12oz (345m), un vaso de vino de 5oz (1449m o un vaso de una bebida alcohlica de alta graduacin de 1oz (4458m Instrucciones generales  Realcese los estudios de rutina de la salud, dentales y de la Public librarianManBoundaryInfrmele a su mdico si: ? Se siente deprimida con frecuencia. ? Alguna vez ha sido vctima de malLafeno se siente segura en su casa. Resumen  Adoptar un estilo de vida saludable y recibir atencin preventiva son importantes para promover la salud y el MusicianSiga las instrucciones del mdico acerca de una dieta saludable, el ejercicio y  la realizacin de pruebas o exmenes para detEngineer, building servicesSiga las instrucciones del mdico con respecto al control del colesterol y la presin arterial. Esta informacin no tiene comMarine scientist consejo del mdico. Asegrese de hacerle al mdico cualquier pregunta que tenga. Document Revised: 09/01/2018 Document Reviewed: 09/01/2018 Elsevier Patient Education  202WelchHipertensin en los adultos Hypertension, Adult El trmino hipertensin es otra forma de denominar a la presin arterial elevada. La presin arterial elevada fuerza al corazn a trabajar ms para bombear la sangre. Esto puede causar problemas con el paso del tieCherry Valleyna lectura de presin arterial est compuesta por 2 nmeros. Hay un nmero superior (sistlico) sobre un nmero inferior (diastlico). Lo ideal es tener la presin arterial por debajo de 120/80. Las elecciones saludables pueden ayudar a bajEngineer, materialsesin arterial, o tal vez necesite medicamentos para bajarla. Cules son las causas? Se desconoce la causa de esta afeccin. Algunas afecciones pueden estar relacionadas con la presin arterial alta. Qu incrementa el riesgo?  Fumar.  Tener diabetes mellitus tipo 2, colesterol alto, o ambos.  No hacer la cantidad suficiente de actividad fsica o ejercicio.  Tener sobrepeso.  Consumir mucha grasa, azcar, caloras o sal (sodio) en su dieta.  Beber alcohol en exceso.  Tener una enfermedad renal a largo plazo (crnica).  Tener antecedentes familiares de presin arterial alta.  Edad. Los riesgos aumentan con la edad.  [place]aza. El riesgo es mayor para las perRetail bankerSexo. Antes de los 45aos, los hombres corren ms rieEcolabespus de los 65aos, las mujeres corren ms rie3M CompanyTener apnea obstructiva del sueo.  Estrs. Cules son los signos o los sntomas?  Es posible que la presin arterial alta puede no cause sntomas. La presin  arterial muy alta (crisis hipertensiva) puede provocar: ? Dolor de cabNetherlands Sensaciones de preocupacin o nerviosismo (ansiedad). ? Falta de aire. ? Hemorragia nasal. ? Sensacin de malEngineer, siteuseas). ? Vmitos. ? Cambios en la forma de ver. ? Dolor muy intenso en el pecho. ? Convulsiones. Cmo se trata?  Esta afeccin se trata haciendo cambios saludables en el estilo de vida, por ejemplo: ? Consumir alimentos saludables. ? Hacer ms ejercicio. ? Beber menos alcohol.  El mdico puede recetarle medicamentos si los cambios en el estilo de vida no son suficientes para logChild psychotherapist presin arterial y si: ? El nmero de arriba est por encima de 130. ? El nmero de abajo est por encima de 80.  Su presin arterial personal ideal  puede variar. Siga estas instrucciones en su casa: Comida y bebida   Si se lo dicen, siga el plan de alimentacin de DASH (Dietary Approaches to Stop Hypertension, Maneras de alimentarse para detener la hipertensin). Para seguir este plan: ? Llene la mitad del plato de cada comida con frutas y verduras. ? Llene un cuarto del plato de cada comida con cereales integrales. Los cereales integrales incluyen pasta integral, arroz integral y pan integral. ? Coma y beba productos lcteos con bajo contenido de grasa, como leche descremada o yogur bajo en grasas. ? Llene un cuarto del plato de cada comida con protenas bajas en grasa (magras). Las protenas bajas en grasa incluyen pescado, pollo sin piel, huevos, frijoles y tofu. ? Evite consumir carne grasa, carne curada y procesada, o pollo con piel. ? Evite consumir alimentos prehechos o procesados.  Consuma menos de 1500 mg de sal por da.  No beba alcohol si: ? El mdico le indica que no lo haga. ? Est embarazada, puede estar embarazada o est tratando de quedar embarazada.  Si bebe alcohol: ? Limite la cantidad que bebe a lo siguiente:  De 0 a 1 medida por da para las  mujeres.  De 0 a 2 medidas por da para los hombres. ? Est atento a la cantidad de alcohol que hay en las bebidas que toma. En los [place], una medida equivale a una botella de cerveza de 12oz (348m), un vaso de vino de 5oz (1418m o un vaso de una bebida alcohlica de alta graduacin de 1oz (4437m Estilo de vida   Trabaje con su mdico para mantenerse en un peso saludable o para perder peso. Pregntele a su mdico cul es el peso recomendable para usted.  Haga al menos 15m39mos de ejercicio la mayoHartford Financialla semaTeatos pueden incluir caminar, nadar o andar en bicicleta.  Realice al menos 30 minutos de ejercicio que fortalezca sus msculos (ejercicios de resistencia) al menos 3 das a la semaHarmonytos pueden incluir levantar pesas o hacer [place].  No consuma ningn producto que contenga nicotina o tabaco, como cigarrillos, cigarrillos electrnicos y tabaco de mascHigher education careers adviser necesita ayuda para dejar de fumar, consulte al mdicMeadWestvacoontrole su presin arterial en su casa tal como le indic el mdico.  Concurra a todas las visitas de seguimiento como se lo haya indicado el mdico. Esto es importante. Medicamentos  TomeDelphiventa libre y los recetados solamente como se lo haya indicado el mdico. Siga cuidadosamente las indicaciones.  No omita las dosis de medicamentos para la presin arterial. Los medicamentos pierden eficacia si omite dosis. El hecho de omitir las dosis tambin aumeSerbiariesgo de otros problemas.  Pregntele a su mdico a qu efectos secundarios o reacciones a los mediCareers information officermunquese con un mdico si:  Piensa que tiene una Mexicoccin a los medicamentos que est tomando.  Tiene dolores de cabeza frecuentes (recurrentes).  Se siente mareado.  Tiene hinchazn en los tobillos.  Tiene problemas de visin. Solicite ayuda inmediatamente si:  Siente un dolor de cabeza muy intenso.  Empieza a  sentirse desorientado (confundido).  Se siente dbil o adormecido.  Siente que va a desmayarse.  Tiene un dolor muy intenso en las siguientes zonas: ? Pecho. ? Vientre (abdomen).  Vomita ms de una vez.  Tiene dificultad para respirar. Resumen  El trmino hipertensin es otra forma de denominar a la presin arterial elevada.  La presin arterial elevada fuerza al corazn  a trabajar ms para bombear la sangre.  Para la [employer], una presin arterial normal es menor que 120/80.  Las decisiones saludables pueden ayudarle a disminuir su presin arterial. Si no puede bajar su presin arterial mediante decisiones saludables, es posible que deba tomar medicamentos. Esta informacin no tiene Marine scientist el consejo del mdico. Asegrese de hacerle al mdico cualquier pregunta que tenga. Document Revised: 05/27/2018 Document Reviewed: 05/27/2018 Elsevier Patient Education  [employer] [place] [employer].   If you have lab work done today you will be contacted with your lab results within the next 2 weeks.  If you have not heard from [place] then please contact us. The fastest way to get your results is to register for My Chart.   IF you received an x-ray today, you will receive an invoice from [facility]. Please contact [facility] at [phone] with questions or concerns regarding your invoice.   IF you received labwork today, you will receive an invoice from [facility]. Please contact [facility] at [phone] with questions or concerns regarding your invoice.   Our billing staff will not be able to assist you with questions regarding bills from these companies.  You will be contacted with the lab results as soon as they are available. The fastest way to get your results is to activate your My Chart account. Instructions are located on the last page of this paperwork. If you have not heard from [place] regarding the results in 2 weeks, please contact this office.

## 2020-10-02 ENCOUNTER — Other Ambulatory Visit: Payer: Self-pay

## 2020-10-02 ENCOUNTER — Ambulatory Visit (INDEPENDENT_AMBULATORY_CARE_PROVIDER_SITE_OTHER): Payer: BC Managed Care – PPO | Admitting: Emergency Medicine

## 2020-10-02 ENCOUNTER — Encounter: Payer: Self-pay | Admitting: Emergency Medicine

## 2020-10-02 VITALS — BP 127/82 | HR 61 | Temp 98.2°F | Resp 16 | Ht 60.0 in | Wt 134.0 lb

## 2020-10-02 DIAGNOSIS — E785 Hyperlipidemia, unspecified: Secondary | ICD-10-CM

## 2020-10-02 DIAGNOSIS — Z0001 Encounter for general adult medical examination with abnormal findings: Secondary | ICD-10-CM

## 2020-10-02 DIAGNOSIS — I10 Essential (primary) hypertension: Secondary | ICD-10-CM

## 2020-10-02 DIAGNOSIS — Z0000 Encounter for general adult medical examination without abnormal findings: Secondary | ICD-10-CM

## 2020-10-02 DIAGNOSIS — Z1211 Encounter for screening for malignant neoplasm of colon: Secondary | ICD-10-CM

## 2020-10-02 MED ORDER — LISINOPRIL 30 MG PO TABS: 30.0000 mg | ORAL_TABLET | Freq: Every day | ORAL | 3 refills | Status: DC

## 2020-10-02 NOTE — Progress Notes (Signed)
Autumn Haley 55 y.o.   No chief complaint on file.   HISTORY OF PRESENT ILLNESS: This is a 55 y.o. female here for annual exam. Recently diagnosed with hypertension and dyslipidemia. Started on lisinopril 30 mg daily and atorvastatin 20 mg daily. Fully vaccinated against Covid. Needs referral for colonoscopy. Has no complaints or medical concerns today.  HPI   Prior to Admission medications   Medication Sig Start Date End Date Taking? Authorizing Provider  Ascorbic Acid (VITAMIN C) 1000 MG tablet Take 1,000 mg by mouth daily.    [provider]  atorvastatin (LIPITOR) 20 MG tablet Take 1 tablet (20 mg total) by mouth daily. 07/05/20 06/30/21  Just, Laurita Quint, FNP  Calcium Carb-Cholecalciferol (CALCIUM 600+D3 PO) Take by mouth daily.    [provider]  cetirizine (ZYRTEC) 10 MG tablet Take 1 tablet (10 mg total) by mouth at bedtime. 07/05/20   Just, Laurita Quint, FNP  Cholecalciferol (VITAMIN D3 PO) Take 1,000 Units by mouth daily.     [provider]  fluticasone (FLONASE) 50 MCG/ACT nasal spray Place 2 sprays into both nostrils daily. 07/05/20   Just, Laurita Quint, FNP  lisinopril (ZESTRIL) 30 MG tablet Take 1 tablet (30 mg total) by mouth daily. 07/05/20   Just, Laurita Quint, FNP  Zinc 50 MG CAPS Take by mouth daily.     [provider]    Allergies  Allergen Reactions  . Levaquin [Levofloxacin] Rash    Patient Active Problem List   Diagnosis Date Noted  . Chronic pain of left knee 03/17/2018  . Primary osteoarthritis of left knee 03/17/2018  . Pain in finger of both hands 06/17/2017  . Osteoarthritis of fingers of hands, bilateral 06/17/2017  . Arthralgia 02/23/2017  . Vitamin D deficiency 02/11/2017  . Hypocalcemia 02/11/2017  . Renal dysfunction 07/26/2012  . Fibroid uterus 12/26/2011    Past Medical History:  Diagnosis Date  . Arthritis   . Chronic kidney disease    right kidney  . Fibroid    FIBROID UTERUS  . NSVD (normal  spontaneous vaginal delivery)    X 2  . NSVD (normal spontaneous vaginal delivery)    X 2  . SAB (spontaneous abortion)   . SAB (spontaneous abortion)   . Vitamin D deficiency     History reviewed. No pertinent surgical history.  Social History   Socioeconomic History  . Marital status: Married    Spouse name: Donnie Aho  . Number of children: 2  . Years of education: 58  . Highest education level: Not on file  Occupational History  . Occupation: Educational psychologist: [employer]    Comment: Housekeeping  Tobacco Use  . Smoking status: Never Smoker  . Smokeless tobacco: Never Used  Vaping Use  . Vaping Use: Never used  Substance and Sexual Activity  . Alcohol use: No    Comment: OCCASIONALLY  . Drug use: No  . Sexual activity: Not Currently    Partners: Male    Birth control/protection: Rhythm  Other Topics Concern  . Not on file  Social History Narrative   ** Merged History Encounter **       From [place], [place].  Lives here with her husband and 2 children.   Social Determinants of Health   Financial Resource Strain: Not on file  Food Insecurity: Not on file  Transportation Needs: Not on file  Physical Activity: Not on file  Stress: Not on file  Social Connections: Not on file  Intimate Partner Violence: Not on file    History reviewed. No pertinent family history.   Review of Systems  Constitutional: Negative.  Negative for chills and fever.  HENT: Negative.  Negative for congestion and sore throat.   Eyes: Negative.   Respiratory: Negative.  Negative for cough and shortness of breath.   Cardiovascular: Negative.  Negative for chest pain and palpitations.  Gastrointestinal: Negative.  Negative for abdominal pain, blood in stool, diarrhea, melena, nausea and vomiting.  Genitourinary: Negative.  Negative for dysuria and hematuria.  Musculoskeletal: Negative.  Negative for back pain, myalgias and neck pain.  Skin: Negative.  Negative for  rash.  Neurological: Negative.  Negative for dizziness and headaches.  All other systems reviewed and are negative.     Today's Vitals   10/02/20 1020  BP: 127/82  Pulse: 61  Resp: 16  Temp: 98.2 F (36.8 C)  TempSrc: Temporal  SpO2: 100%  Weight: 134 lb (60.8 kg)  Height: 5' (1.524 m)   Body mass index is 26.17 kg/m. Wt Readings from Last 3 Encounters:  10/02/20 134 lb (60.8 kg)  07/05/20 132 lb (59.9 kg)  08/03/19 131 lb (59.4 kg)    Physical Exam Vitals reviewed.  Constitutional:      Appearance: Normal appearance.  HENT:     Head: Normocephalic.     Mouth/Throat:     Mouth: Mucous membranes are moist.     Pharynx: Oropharynx is clear.  Eyes:     Extraocular Movements: Extraocular movements intact.     Conjunctiva/sclera: Conjunctivae normal.     Pupils: Pupils are equal, round, and reactive to light.  Cardiovascular:     Rate and Rhythm: Normal rate and regular rhythm.     Pulses: Normal pulses.     Heart sounds: Normal heart sounds.  Pulmonary:     Effort: Pulmonary effort is normal.     Breath sounds: Normal breath sounds.  Abdominal:     General: Bowel sounds are normal. There is no distension.     Palpations: Abdomen is soft.     Tenderness: There is no abdominal tenderness.  Musculoskeletal:        General: Normal range of motion.     Cervical back: Normal range of motion and neck supple. No tenderness.  Lymphadenopathy:     Cervical: No cervical adenopathy.  Skin:    General: Skin is warm and dry.     Capillary Refill: Capillary refill takes less than 2 seconds.  Neurological:     General: No focal deficit present.     Mental Status: She is alert and oriented to person, place, and time.  Psychiatric:        Mood and Affect: Mood normal.        Behavior: Behavior normal.      ASSESSMENT & PLAN: [name] was seen today for annual exam.  Diagnoses and all orders for this visit:  Routine general medical examination at a health care  facility  Essential hypertension -     Comprehensive metabolic panel  Dyslipidemia -     Hemoglobin A1c -     Lipid panel  Screening for colon cancer -     Ambulatory referral to Gastroenterology    Patient Instructions       If you have lab work done today you will be contacted with your lab results within the next 2 weeks.  If you have not heard from [place] then please contact us. The fastest way to get  your results is to register for My Chart.   IF you received an x-ray today, you will receive an invoice from [facility]. Please contact [facility] at [phone] with questions or concerns regarding your invoice.   IF you received labwork today, you will receive an invoice from [facility]. Please contact [facility] at [phone] with questions or concerns regarding your invoice.   Our billing staff will not be able to assist you with questions regarding bills from these companies.  You will be contacted with the lab results as soon as they are available. The fastest way to get your results is to activate your My Chart account. Instructions are located on the last page of this paperwork. If you have not heard from [place] regarding the results in 2 weeks, please contact this office.      Mantenimiento de Technical sales engineer en [place] Maintenance, Female Adoptar un estilo de vida saludable y recibir atencin preventiva son importantes para promover la salud y Musician. Consulte al mdico sobre:  El esquema adecuado para hacerse pruebas y exmenes peridicos.  Cosas que puede hacer por su cuenta para prevenir enfermedades y [employer]. Qu debo saber sobre la dieta, el peso y el ejercicio? Consuma una dieta saludable  Consuma una dieta que incluya muchas verduras, frutas, productos lcteos con bajo contenido de [place] y Advertising account planner.  No consuma muchos alimentos ricos en grasas slidas, azcares agregados o sodio.   Mantenga un peso saludable El  ndice de masa muscular [facility]) se [place] para identificar problemas de [place]. Proporciona una estimacin de la grasa corporal basndose en el peso y la altura. Su mdico puede ayudarle a Radiation protection practitioner [place] y a Scientist, forensic o Theatre manager un peso saludable. Haga ejercicio con regularidad Haga ejercicio con regularidad. Esta es una de las prcticas ms importantes que puede hacer por su salud. La mayora de los adultos deben seguir estas pautas:  Optometrist, al menos, 160mnutos de actividad fsica por semana. El ejercicio debe aumentar la frecuencia cardaca y hNature conservation officertranspirar (ejercicio de intensidad moderada).  Hacer ejercicios de fortalecimiento por lo mHalliburton Companypor semana. Agregue esto a su plan de ejercicio de intensidad moderada.  Pasar menos tiempo sentados. Incluso la actividad fsica ligera puede ser beneficiosa. Controle sus niveles de colesterol y lpidos en la sangre Comience a realizarse anlisis de lpidos y cResearch officer, trade unionen la sangre a los 20aos y luego reptalos cada 5aos. Hgase controlar los niveles de colesterol con mayor frecuencia si:  Sus niveles de lpidos y colesterol son altos.  Es mayor de 40aos.  Presenta un alto riesgo de padecer enfermedades cardacas. Qu debo saber sobre las pruebas de deteccin del cncer? Segn su historia clnica y sus antecedentes familiares, es posible que deba realizarse pruebas de deteccin del cncer en diferentes edades. Esto puede incluir pruebas de deteccin de lo siguiente:  Cncer de mama.  Cncer de cuello uterino.  Cncer colorrectal.  Cncer de piel.  Cncer de pulmn. Qu debo saber sobre la enfermedad cardaca, la diabetes y la hipertensin arterial? Presin arterial y enfermedad cardaca  La hipertensin arterial causa enfermedades cardacas y aSerbiael riesgo de accidente cerebrovascular. Es ms probable que esto se manifieste en las personas que tienen lecturas de presin arterial alta, tienen ascendencia africana o  tienen sobrepeso.  Hgase controlar la presin arterial: ? Cada 3 a 5 aos si tiene entre 18 y 320aos. ? Todos los aos si es mayor de 4[place] Diabetes Realcese exmenes de deteccin de la  diabetes con regularidad. Este anlisis revisa el nivel de azcar en la sangre en [place]. Hgase las pruebas de deteccin:  Cada tresaos despus de los 39aos de edad si tiene un peso normal y un bajo riesgo de padecer diabetes.  Con ms frecuencia y a partir de [place] edad inferior si tiene sobrepeso o un alto riesgo de padecer diabetes. Qu debo saber sobre la prevencin de infecciones? Hepatitis B Si tiene un riesgo ms alto de contraer hepatitis B, debe someterse a un examen de deteccin de este virus. Hable con el mdico para averiguar si tiene riesgo de contraer la infeccin por hepatitis B. Hepatitis C Se recomienda el anlisis a:  [employer] 1945 y 1965.  Todas las personas que tengan un riesgo de haber contrado hepatitis C. Enfermedades de transmisin sexual (ETS)  Hgase las pruebas de Programme researcher, broadcasting/film/video de ITS, incluidas la gonorrea y la clamidia, si: ? Es sexualmente activa y es menor de [place]. ? Es mayor de 24aos, y Investment banker, operational informa que corre riesgo de tener este tipo de infecciones. ? La actividad sexual ha cambiado desde que le hicieron la ltima prueba de deteccin y tiene un riesgo mayor de Best boy clamidia o Radio broadcast assistant. Pregntele al mdico si usted tiene riesgo.  Pregntele al mdico si usted tiene un alto riesgo de Museum/gallery curator VIH. El mdico tambin puede recomendarle un medicamento recetado para ayudar a evitar la infeccin por el VIH. Si elige tomar medicamentos para prevenir el VIH, primero debe Pil[employer] Pride de deteccin del VIH. Luego debe hacerse anlisis cada 39mses mientras est tomando los medicamentos. Embarazo  Si est por dejar de mLibrarian, academic(fase premenopusica) y usted puede quedar e[place] busque asesoramiento antes de q[place]  Tome de 400 a [id number](mcg) de cido fAnheuser-Buschsi qI[place]  Pida mtodos de control de la natalidad (anticonceptivos) si desea evitar un embarazo no deseado. Osteoporosis y mBrazilLa osteoporosis es una enfermedad en la que los huesos pierden los minerales y la fuerza por el avance de la edad. El resultado pueden ser fracturas en los hShelter [place] Si tiene 65aos o ms, o si est en riesgo de sufrir osteoporosis y fracturas, pregunte a su mdico si debe:  Hacerse pruebas de deteccin de prdida sea.  Tomar un suplemento de calcio o de vitamina D para reducir el riesgo de fracturas.  Recibir terapia de reemplazo hormonal (TRH) para tratar los sntomas de la menopausia. Siga estas instrucciones en su casa: Estilo de vida  No consuma ningn producto que contenga nicotina o tabaco, como cigarrillos, cigarrillos electrnicos y tabaco de mHigher education careers adviser Si necesita ayuda para dejar de fumar, consulte al mdico.  No consuma drogas.  No comparta agujas.  Solicite ayuda a su mdico si necesita apoyo o informacin para abandonar las drogas. Consumo de alcohol  No beba alcohol si: ? Su mdico le indica no hacerlo. ? Est embarazada, puede estar embarazada o est tratando de quedar embarazada.  Si bebe alcohol: ? Limite la cantidad que consume de 0 a 1 medida por da. ? Limite la ingesta si est amamantando.  Est atento a la cantidad de alcohol que hay en las bebidas que toma. En los [place] una medida equivale a una botella de cerveza de 12oz (3510m, un vaso de vino de 5oz (14823mo un vaso de una bebida alcohlica de alta graduacin de 1oz (13m97mInstrucciones generales  Realcese los estudios de rutina de la salud, dentales y de la vPublic librarianantngase  al da con las vacunas.  Infrmele a su mdico si: ? Se siente deprimida con frecuencia. ? Alguna vez ha sido vctima de [place] o no se siente segura en su casa. Resumen  Adoptar  un estilo de vida saludable y recibir atencin preventiva son importantes para promover la salud y Musician.  Siga las instrucciones del mdico acerca de una dieta saludable, el ejercicio y la realizacin de pruebas o exmenes para Engineer, building services.  Siga las instrucciones del mdico con respecto al control del colesterol y la presin arterial. Esta informacin no tiene Marine scientist el consejo del mdico. Asegrese de hacerle al mdico cualquier pregunta que tenga. Document Revised: 09/01/2018 Document Reviewed: 09/01/2018 Elsevier Patient Education  2021 Elsevier Inc.      Agustina Caroli, MD [facility]

## 2020-10-02 NOTE — Patient Instructions (Addendum)
If you have lab work done today you will be contacted with your lab results within the next 2 weeks.  If you have not heard from [place] then please contact us. The fastest way to get your results is to register for My Chart.   IF you received an x-ray today, you will receive an invoice from [facility]. Please contact [facility] at [phone] with questions or concerns regarding your invoice.   IF you received labwork today, you will receive an invoice from [facility]. Please contact [facility] at [phone] with questions or concerns regarding your invoice.   Our billing staff will not be able to assist you with questions regarding bills from these companies.  You will be contacted with the lab results as soon as they are available. The fastest way to get your results is to activate your My Chart account. Instructions are located on the last page of this paperwork. If you have not heard from [place] regarding the results in 2 weeks, please contact this office.      Mantenimiento de Technical sales engineer en [employer], Female Adoptar un estilo de vida saludable y recibir atencin preventiva son importantes para promover la salud y Musician. Consulte al mdico sobre:  El esquema adecuado para hacerse pruebas y exmenes peridicos.  Cosas que puede hacer por su cuenta para prevenir enfermedades y [employer]. Qu debo saber sobre la dieta, el peso y el ejercicio? Consuma una dieta saludable  Consuma una dieta que incluya muchas verduras, frutas, productos lcteos con bajo contenido de [place] y Advertising account planner.  No consuma muchos alimentos ricos en grasas slidas, azcares agregados o sodio.   Mantenga un peso saludable El ndice de masa muscular [facility]) se [place] and [place] para identificar problemas de [place]. Proporciona una estimacin de la grasa corporal basndose en el peso y la altura. Su mdico puede ayudarle a Radiation protection practitioner [place] y a Scientist, forensic o Theatre manager un peso  saludable. Haga ejercicio con regularidad Haga ejercicio con regularidad. Esta es una de las prcticas ms importantes que puede hacer por su salud. La mayora de los adultos deben seguir estas pautas:  Optometrist, al menos, 14mnutos de actividad fsica por semana. El ejercicio debe aumentar la frecuencia cardaca y hNature conservation officertranspirar (ejercicio de intensidad moderada).  Hacer ejercicios de fortalecimiento por lo mHalliburton Companypor semana. Agregue esto a su plan de ejercicio de intensidad moderada.  Pasar menos tiempo sentados. Incluso la actividad fsica ligera puede ser beneficiosa. Controle sus niveles de colesterol y lpidos en la sangre Comience a realizarse anlisis de lpidos y cResearch officer, trade unionen la sangre a los 20aos y luego reptalos cada 5aos. Hgase controlar los niveles de colesterol con mayor frecuencia si:  Sus niveles de lpidos y colesterol son altos.  Es mayor de 40aos.  Presenta un alto riesgo de padecer enfermedades cardacas. Qu debo saber sobre las pruebas de deteccin del cncer? Segn su historia clnica y sus antecedentes familiares, es posible que deba realizarse pruebas de deteccin del cncer en diferentes edades. Esto puede incluir pruebas de deteccin de lo siguiente:  Cncer de mama.  Cncer de cuello uterino.  Cncer colorrectal.  Cncer de piel.  Cncer de pulmn. Qu debo saber sobre la enfermedad cardaca, la diabetes y la hipertensin arterial? Presin arterial y enfermedad cardaca  La hipertensin arterial causa enfermedades cardacas y aSerbiael riesgo de accidente cerebrovascular. Es ms probable que esto se manifieste en las personas que tienen lecturas de presin arterial alta, tienen ascendencia  africana o tienen sobrepeso.  Hgase controlar la presin arterial: ? Cada 3 a 5 aos si tiene entre 18 y 79 aos. ? Todos los aos si es mayor de [place]. Diabetes Realcese exmenes de deteccin de la diabetes con regularidad. Este  anlisis revisa el nivel de azcar en la sangre en [place]. Hgase las pruebas de deteccin:  Cada tresaos despus de los 80aos de edad si tiene un peso normal y un bajo riesgo de padecer diabetes.  Con ms frecuencia y a partir de [place] edad inferior si tiene sobrepeso o un alto riesgo de padecer diabetes. Qu debo saber sobre la prevencin de infecciones? Hepatitis B Si tiene un riesgo ms alto de contraer hepatitis B, debe someterse a un examen de deteccin de este virus. Hable con el mdico para averiguar si tiene riesgo de contraer la infeccin por hepatitis B. Hepatitis C Se recomienda el anlisis a:  [employer] 1945 y 1965.  Todas las personas que tengan un riesgo de haber contrado hepatitis C. Enfermedades de transmisin sexual (ETS)  Hgase las pruebas de Programme researcher, broadcasting/film/video de ITS, incluidas la gonorrea y la clamidia, si: ? Es sexualmente activa y es menor de [place]. ? Es mayor de 24aos, y Investment banker, operational informa que corre riesgo de tener este tipo de infecciones. ? La actividad sexual ha cambiado desde que le hicieron la ltima prueba de deteccin y tiene un riesgo mayor de Best boy clamidia o Radio broadcast assistant. Pregntele al mdico si usted tiene riesgo.  Pregntele al mdico si usted tiene un alto riesgo de Museum/gallery curator VIH. El mdico tambin puede recomendarle un medicamento recetado para ayudar a evitar la infeccin por el VIH. Si elige tomar medicamentos para prevenir el VIH, primero debe Pilgrim's Pride de deteccin del VIH. Luego debe hacerse anlisis cada 44mses mientras est tomando los medicamentos. Embarazo  Si est por dejar de mLibrarian, academic(fase premenopusica) y usted puede quedar eVista busque asesoramiento antes de qBotsw[place]  Tome de 400 a [id number](mcg) de cido fAnheuser-Buschsi qI[place]  Pida mtodos de control de la natalidad (anticonceptivos) si desea evitar un embarazo no deseado. Osteoporosis y mBrazilLa  osteoporosis es una enfermedad en la que los huesos pierden los minerales y la fuerza por el avance de la edad. El resultado pueden ser fracturas en los hGold [place] Si tiene 65aos o ms, o si est en riesgo de sufrir osteoporosis y fracturas, pregunte a su mdico si debe:  Hacerse pruebas de deteccin de prdida sea.  Tomar un suplemento de calcio o de vitamina D para reducir el riesgo de fracturas.  Recibir terapia de reemplazo hormonal (TRH) para tratar los sntomas de la menopausia. Siga estas instrucciones en su casa: Estilo de vida  No consuma ningn producto que contenga nicotina o tabaco, como cigarrillos, cigarrillos electrnicos y tabaco de mHigher education careers adviser Si necesita ayuda para dejar de fumar, consulte al mdico.  No consuma drogas.  No comparta agujas.  Solicite ayuda a su mdico si necesita apoyo o informacin para abandonar las drogas. Consumo de alcohol  No beba alcohol si: ? Su mdico le indica no hacerlo. ? Est embarazada, puede estar embarazada o est tratando de quedar embarazada.  Si bebe alcohol: ? Limite la cantidad que consume de 0 a 1 medida por da. ? Limite la ingesta si est amamantando.  Est atento a la cantidad de alcohol que hay en las bebidas que toma. En los [place], una medida eCottonwooda una botella de cChartered certified accountantde  12oz (346m), un vaso de vino de 5oz (1461m o un vaso de una bebida alcohlica de alta graduacin de 1oz (4432m Instrucciones generales  Realcese los estudios de rutina de la salud, dentales y de la Public librarianManPageInfrmele a su mdico si: ? Se siente deprimida con frecuencia. ? Alguna vez ha sido vctima de malSouth Templeno se siente segura en su casa. Resumen  Adoptar un estilo de vida saludable y recibir atencin preventiva son importantes para promover la salud y el MusicianSiga las instrucciones del mdico acerca de una dieta saludable, el ejercicio y la realizacin de pruebas o exmenes  para detEngineer, building servicesSiga las instrucciones del mdico con respecto al control del colesterol y la presin arterial. Esta informacin no tiene comMarine scientist consejo del mdico. Asegrese de hacerle al mdico cualquier pregunta que tenga. Document Revised: 09/01/2018 Document Reviewed: 09/01/2018 Elsevier Patient Education  202[place]

## 2020-10-03 LAB — COMPREHENSIVE METABOLIC PANEL
ALT: 17 IU/L (ref 0–32)
AST: 19 IU/L (ref 0–40)
Albumin/Globulin Ratio: 1.6 (ref 1.2–2.2)
Albumin: 4.1 g/dL (ref 3.8–4.9)
Alkaline Phosphatase: 78 IU/L (ref 44–121)
BUN/Creatinine Ratio: 27 — ABNORMAL HIGH (ref 9–23)
BUN: 19 mg/dL (ref 6–24)
Bilirubin Total: 0.6 mg/dL (ref 0.0–1.2)
CO2: 22 mmol/L (ref 20–29)
Calcium: 8.6 mg/dL — ABNORMAL LOW (ref 8.7–10.2)
Chloride: 104 mmol/L (ref 96–106)
Creatinine, Ser: 0.7 mg/dL (ref 0.57–1.00)
GFR calc Af Amer: 115 mL/min/{1.73_m2} (ref >59)
GFR calc non Af Amer: 100 mL/min/{1.73_m2} (ref >59)
Globulin, Total: 2.5 g/dL (ref 1.5–4.5)
Glucose: 97 mg/dL (ref 65–99)
Potassium: 4.1 mmol/L (ref 3.5–5.2)
Sodium: 138 mmol/L (ref 134–144)
Total Protein: 6.6 g/dL (ref 6.0–8.5)

## 2020-10-03 LAB — HEMOGLOBIN A1C
Est. average glucose Bld gHb Est-mCnc: 137 mg/dL
Hgb A1c MFr Bld: 6.4 % — ABNORMAL HIGH (ref 4.8–5.6)

## 2020-10-03 LAB — LIPID PANEL
Chol/HDL Ratio: 2 ratio (ref 0.0–4.4)
Cholesterol, Total: 133 mg/dL (ref 100–199)
HDL: 65 mg/dL (ref >39)
LDL Chol Calc (NIH): 57 mg/dL (ref 0–99)
Triglycerides: 45 mg/dL (ref 0–149)
VLDL Cholesterol Cal: 11 mg/dL (ref 5–40)

## 2021-04-02 ENCOUNTER — Ambulatory Visit: Payer: Self-pay | Admitting: Emergency Medicine

## 2021-04-09 ENCOUNTER — Other Ambulatory Visit: Payer: Self-pay

## 2021-04-09 ENCOUNTER — Ambulatory Visit (INDEPENDENT_AMBULATORY_CARE_PROVIDER_SITE_OTHER): Payer: BC Managed Care – PPO | Admitting: Nurse Practitioner

## 2021-04-09 VITALS — BP 118/78

## 2021-04-09 DIAGNOSIS — M5431 Sciatica, right side: Secondary | ICD-10-CM

## 2021-04-09 MED ORDER — PREDNISONE 10 MG (21) PO TBPK: ORAL_TABLET | ORAL | 0 refills | Status: DC

## 2021-04-09 NOTE — Progress Notes (Signed)
   Acute Office Visit  Subjective:    Patient ID: Autumn Haley, female    DOB: Jan 10, 1966, 55 y.o.   MRN: [id number]   HPI 55 y.o. presents today for right-sided pain x 3 weeks. Pain radiates from back, along buttock, and down her right leg. Pain is worse when she goes from sitting to standing and when she puts weight on her right leg. Extended walking helps it. Denies numbness or weakness. PCP office closed and she has not been able to find where he is practicing. Interpreter present during visit.    Review of Systems  Constitutional: Negative.   Genitourinary: Negative.   Musculoskeletal:  Positive for back pain.       Posterior leg pain  Neurological:  Negative for weakness and numbness.      Objective:    Physical Exam Constitutional:      Appearance: Normal appearance.  Musculoskeletal:     Lumbar back: Normal.     Right upper leg: Normal.    BP 118/78   LMP 02/19/2017  Wt Readings from Last 3 Encounters:  10/02/20 134 lb (60.8 kg)  07/05/20 132 lb (59.9 kg)  08/03/19 131 lb (59.4 kg)        Assessment & Plan:   Problem List Items Addressed This Visit   None Visit Diagnoses     Right sciatic nerve pain    -  Primary   Relevant Medications   predniSONE (STERAPRED UNI-PAK 21 TAB) 10 MG (21) TBPK tablet      Plan: Pain likely related to sciatic nerve inflammation. Provided 21-day Prednisone pack and recommend Ibuprofen every 8 hours. Recommend following up with PCP or urgent care if no improvement and provided her with new practice contact information. All questions answered.      Autumn Gammon DNP, 11:03 AM 04/09/2021

## 2021-05-14 ENCOUNTER — Ambulatory Visit (INDEPENDENT_AMBULATORY_CARE_PROVIDER_SITE_OTHER): Payer: BC Managed Care – PPO | Admitting: Obstetrics & Gynecology

## 2021-05-14 ENCOUNTER — Other Ambulatory Visit: Payer: Self-pay

## 2021-05-14 ENCOUNTER — Encounter: Payer: Self-pay | Admitting: Obstetrics & Gynecology

## 2021-05-14 VITALS — BP 154/80 | HR 62 | Resp 14 | Ht 59.5 in | Wt 136.8 lb

## 2021-05-14 DIAGNOSIS — Z780 Asymptomatic menopausal state: Secondary | ICD-10-CM | POA: Diagnosis not present

## 2021-05-14 DIAGNOSIS — Z01419 Encounter for gynecological examination (general) (routine) without abnormal findings: Secondary | ICD-10-CM

## 2021-05-14 NOTE — Progress Notes (Signed)
Autumn Haley [birth year] [id number]   History:    55 y.o.  [id number] Separated, husband back in [place].  Son is 35 yo.  Daughter is 65 yo, Dx of skin cancer.   RP:  Established patient presenting for annual gyn exam    HPI: Postmenopause since age 5 yo, well on no HRT.  No postmenopausal bleeding.  No pelvic pain.  Currently abstinent. Pap Neg in 07/2019.  Urine and bowel movements normal.  Breasts normal.  Overdue for Mammo.  Body mass index 27.17.  Health labs with Fam MD.  Needs to schedule [place].  Past medical history,surgical history, family history and social history were all reviewed and documented in the EPIC chart.  Gynecologic History Patient's last menstrual period was 02/19/2017.  Obstetric History OB History  Gravida Para Term Preterm AB Living  3 2 2  0 1 2  SAB IAB Ectopic Multiple Live Births  1 0 0   2    # Outcome Date GA Lbr Len/2nd Weight Sex Delivery Anes PTL Lv  3 SAB           2 Term     F Vag-Spont  N LIV  1 Term     M Vag-Spont  N LIV     ROS: A ROS was performed and pertinent positives and negatives are included in the history.  GENERAL: No fevers or chills. HEENT: No change in vision, no earache, sore throat or sinus congestion. NECK: No pain or stiffness. CARDIOVASCULAR: No chest pain or pressure. No palpitations. PULMONARY: No shortness of breath, cough or wheeze. GASTROINTESTINAL: No abdominal pain, nausea, vomiting or diarrhea, melena or bright red blood per rectum. GENITOURINARY: No urinary frequency, urgency, hesitancy or dysuria. MUSCULOSKELETAL: No joint or muscle pain, no back pain, no recent trauma. DERMATOLOGIC: No rash, no itching, no lesions. ENDOCRINE: No polyuria, polydipsia, no heat or cold intolerance. No recent change in weight. HEMATOLOGICAL: No anemia or easy bruising or bleeding. NEUROLOGIC: No headache, seizures, numbness, tingling or weakness. PSYCHIATRIC: No depression, no loss of interest in normal activity or change in sleep  pattern.     Exam:   BP (!) 154/80 (BP Location: Right Arm, Patient Position: Sitting, Cuff Size: Normal)   Pulse 62   Resp 14   Ht 4' 11.5" (1.511 m)   Wt 136 lb 12.8 oz (62.1 kg)   LMP 02/19/2017   BMI 27.17 kg/m   Body mass index is 27.17 kg/m.  General appearance : Well developed well nourished female. No acute distress HEENT: Eyes: no retinal hemorrhage or exudates,  Neck supple, trachea midline, no carotid bruits, no thyroidmegaly Lungs: Clear to auscultation, no rhonchi or wheezes, or rib retractions  Heart: Regular rate and rhythm, no murmurs or gallops Breast:Examined in sitting and supine position were symmetrical in appearance, no palpable masses or tenderness,  no skin retraction, no nipple inversion, no nipple discharge, no skin Discoloration, no axillary or supraclavicular lymphadenopathy Abdomen: no palpable masses or tenderness, no rebound or guarding Extremities: no edema or skin discoloration or tenderness  Pelvic: Vulva: Normal             Vagina: No gross lesions or discharge  Cervix: No gross lesions or discharge  Uterus  AV, normal size, shape and consistency, non-tender and mobile  Adnexa  Without masses or tenderness  Anus: Normal   Assessment/Plan:  55 y.o. female for annual exam   1. Well female exam with routine gynecological exam Normal gynecologic exam in postmenopausal.  Pap test negative in 2020, will repeat next year.  Breast exam normal.  Last screening mammogram negative in 2020, will schedule a repeat mammogram now.  Needs to schedule her first colonoscopy.  Health labs with family physician.  Body mass index 27.17.  Mild decrease in calories/carb and nutrition.  Improved fitness with aerobic activities 5 times a week and light weightlifting every 2 days.  2. Postmenopause  Well on no hormone replacement therapy.  No postmenopausal bleeding.  Recommend vitamin D supplements, calcium intake of 1.5 g/day total and regular weightbearing physical  activities.  Princess Bruins MD, 11:32 AM 05/14/2021

## 2021-05-16 ENCOUNTER — Encounter: Payer: Self-pay | Admitting: Obstetrics & Gynecology

## 2021-07-29 ENCOUNTER — Other Ambulatory Visit: Payer: Self-pay | Admitting: Emergency Medicine

## 2021-07-29 DIAGNOSIS — Z1231 Encounter for screening mammogram for malignant neoplasm of breast: Secondary | ICD-10-CM

## 2021-07-30 ENCOUNTER — Ambulatory Visit
Admission: RE | Admit: 2021-07-30 | Discharge: 2021-07-30 | Disposition: A | Discharge status: Home / Self Care | Payer: BC Managed Care – PPO | Source: Ambulatory Visit | Attending: Emergency Medicine | Admitting: Emergency Medicine

## 2021-07-30 DIAGNOSIS — Z1231 Encounter for screening mammogram for malignant neoplasm of breast: Secondary | ICD-10-CM | POA: Diagnosis not present

## 2021-08-22 ENCOUNTER — Ambulatory Visit (INDEPENDENT_AMBULATORY_CARE_PROVIDER_SITE_OTHER): Payer: BC Managed Care – PPO | Admitting: Emergency Medicine

## 2021-08-22 ENCOUNTER — Other Ambulatory Visit: Payer: Self-pay

## 2021-08-22 ENCOUNTER — Encounter: Payer: Self-pay | Admitting: Emergency Medicine

## 2021-08-22 VITALS — BP 126/70 | HR 83 | Wt 143.0 lb

## 2021-08-22 DIAGNOSIS — Z1211 Encounter for screening for malignant neoplasm of colon: Secondary | ICD-10-CM | POA: Diagnosis not present

## 2021-08-22 DIAGNOSIS — I10 Essential (primary) hypertension: Secondary | ICD-10-CM

## 2021-08-22 DIAGNOSIS — E785 Hyperlipidemia, unspecified: Secondary | ICD-10-CM | POA: Insufficient documentation

## 2021-08-22 DIAGNOSIS — L989 Disorder of the skin and subcutaneous tissue, unspecified: Secondary | ICD-10-CM | POA: Insufficient documentation

## 2021-08-22 DIAGNOSIS — Z23 Encounter for immunization: Secondary | ICD-10-CM

## 2021-08-22 DIAGNOSIS — I152 Hypertension secondary to endocrine disorders: Secondary | ICD-10-CM | POA: Insufficient documentation

## 2021-08-22 LAB — COMPREHENSIVE METABOLIC PANEL
ALT: 17 U/L (ref 0–35)
AST: 19 U/L (ref 0–37)
Albumin: 4 g/dL (ref 3.5–5.2)
Alkaline Phosphatase: 64 U/L (ref 39–117)
BUN: 15 mg/dL (ref 6–23)
CO2: 29 mEq/L (ref 19–32)
Calcium: 8.9 mg/dL (ref 8.4–10.5)
Chloride: 103 mEq/L (ref 96–112)
Creatinine, Ser: 0.66 mg/dL (ref 0.40–1.20)
GFR: 100.14 mL/min (ref >60.00)
Glucose, Bld: 94 mg/dL (ref 70–99)
Potassium: 4 mEq/L (ref 3.5–5.1)
Sodium: 137 mEq/L (ref 135–145)
Total Bilirubin: 0.4 mg/dL (ref 0.2–1.2)
Total Protein: 7 g/dL (ref 6.0–8.3)

## 2021-08-22 LAB — HEMOGLOBIN A1C: Hgb A1c MFr Bld: 6.6 % — ABNORMAL HIGH (ref 4.6–6.5)

## 2021-08-22 LAB — CBC WITH DIFFERENTIAL/PLATELET
Basophils Absolute: 0 10*3/uL (ref 0.0–0.1)
Basophils Relative: 0.6 % (ref 0.0–3.0)
Eosinophils Absolute: 0.1 10*3/uL (ref 0.0–0.7)
Eosinophils Relative: 1.4 % (ref 0.0–5.0)
HCT: 38.5 % (ref 36.0–46.0)
Hemoglobin: 12.6 g/dL (ref 12.0–15.0)
Lymphocytes Relative: 39.1 % (ref 12.0–46.0)
Lymphs Abs: 2.8 10*3/uL (ref 0.7–4.0)
MCHC: 32.7 g/dL (ref 30.0–36.0)
MCV: 87.9 fl (ref 78.0–100.0)
Monocytes Absolute: 0.6 10*3/uL (ref 0.1–1.0)
Monocytes Relative: 8.1 % (ref 3.0–12.0)
Neutro Abs: 3.6 10*3/uL (ref 1.4–7.7)
Neutrophils Relative %: 50.8 % (ref 43.0–77.0)
Platelets: 255 10*3/uL (ref 150.0–400.0)
RBC: 4.38 Mil/uL (ref 3.87–5.11)
RDW: 13.5 % (ref 11.5–15.5)
WBC: 7 10*3/uL (ref 4.0–10.5)

## 2021-08-22 LAB — LIPID PANEL
Cholesterol: 203 mg/dL — ABNORMAL HIGH (ref 0–200)
HDL: 61.4 mg/dL (ref >39.00)
LDL Cholesterol: 123 mg/dL — ABNORMAL HIGH (ref 0–99)
NonHDL: 141.27
Total CHOL/HDL Ratio: 3
Triglycerides: 89 mg/dL (ref 0.0–149.0)
VLDL: 17.8 mg/dL (ref 0.0–40.0)

## 2021-08-22 MED ORDER — ATORVASTATIN CALCIUM 20 MG PO TABS: 20.0000 mg | ORAL_TABLET | Freq: Every day | ORAL | 3 refills | Status: DC

## 2021-08-22 MED ORDER — LISINOPRIL 20 MG PO TABS
20.0000 mg | ORAL_TABLET | Freq: Every day | ORAL | 3 refills | Status: DC
Start: 2021-08-22 — End: 2022-08-14

## 2021-08-22 NOTE — Assessment & Plan Note (Signed)
Well-controlled hypertension.  Continue lisinopril 20 mg daily. BP Readings from Last 3 Encounters:  08/22/21 126/70  05/14/21 (!) 154/80  04/09/21 118/78

## 2021-08-22 NOTE — Patient Instructions (Signed)
Mantenimiento de Technical sales engineer en [place] Maintenance, Female Adoptar un estilo de vida saludable y recibir atencin preventiva son importantes para promover la salud y Musician. Consulte al mdico sobre: El esquema adecuado para hacerse pruebas y exmenes peridicos. Cosas que puede hacer por su cuenta para prevenir enfermedades y Mina sano. Qu debo saber sobre la dieta, el peso y el ejercicio? Consuma una dieta saludable  Consuma una dieta que incluya muchas verduras, frutas, productos lcteos con bajo contenido de [place] y Advertising account planner. No consuma muchos alimentos ricos en grasas slidas, azcares agregados o sodio. Mantenga un peso saludable El ndice de masa muscular [facility]) se [place] para identificar problemas de [place]. Proporciona una estimacin de la grasa corporal basndose en el peso y la altura. Su mdico puede ayudarle a Radiation protection practitioner [place] y a Scientist, forensic o Theatre manager un peso saludable. Haga ejercicio con regularidad Haga ejercicio con regularidad. Esta es una de las prcticas ms importantes que puede hacer por su salud. La [employer] de los adultos deben seguir estas pautas: Optometrist, al menos, 150 minutos de actividad fsica por semana. El ejercicio debe aumentar la frecuencia cardaca y Nature conservation officer transpirar (ejercicio de intensidad moderada). Hacer ejercicios de fortalecimiento por lo [employer] por semana. Agregue esto a su plan de ejercicio de intensidad moderada. Pase menos tiempo sentada. Incluso la actividad fsica ligera puede ser beneficiosa. Controle sus niveles de colesterol y lpidos en la sangre Comience a realizarse anlisis de lpidos y Research officer, trade union en la sangre a los 47 aos y luego reptalos cada 5 aos. Hgase controlar los niveles de colesterol con mayor frecuencia si: Sus niveles de lpidos y colesterol son altos. Es mayor de 12 aos. Presenta un alto riesgo de padecer enfermedades cardacas. Qu debo saber sobre las pruebas de deteccin del  cncer? Segn su historia clnica y sus antecedentes familiares, es posible que deba realizarse pruebas de deteccin del cncer en diferentes edades. Esto puede incluir pruebas de deteccin de lo siguiente: Cncer de mama. Cncer de cuello uterino. Cncer colorrectal. Cncer de piel. Cncer de pulmn. Qu debo saber sobre la enfermedad cardaca, la diabetes y la hipertensin arterial? Presin arterial y enfermedad cardaca La hipertensin arterial causa enfermedades cardacas y [place] el riesgo de accidente cerebrovascular. Es ms probable que esto se manifieste en las personas que tienen lecturas de presin arterial alta o tienen sobrepeso. Hgase controlar la presin arterial: Cada 3 a 5 aos si tiene entre 18 y 45 aos. Todos los aos si es mayor de 40 aos. Diabetes Realcese exmenes de deteccin de la diabetes con regularidad. Este anlisis revisa el nivel de azcar en la sangre en [place]. Hgase las pruebas de deteccin: Cada tres aos despus de los 45 aos de edad si tiene un peso normal y un bajo riesgo de padecer diabetes. Con ms frecuencia y a partir de [place] edad inferior si tiene sobrepeso o un alto riesgo de padecer diabetes. Qu debo saber sobre la prevencin de infecciones? Hepatitis B Si tiene un riesgo ms alto de contraer hepatitis B, debe someterse a un examen de deteccin de este virus. Hable con el mdico para averiguar si tiene riesgo de contraer la infeccin por hepatitis B. Hepatitis C Se recomienda el anlisis a: [employer] 1945 y 1965. Todas las personas que tengan un riesgo de haber contrado hepatitis C. Enfermedades de transmisin sexual (ETS) Hgase las pruebas de Programme researcher, broadcasting/film/video de ITS, incluidas la gonorrea y la clamidia, si: Es sexualmente activa y es Garment/textile technologist de 24  aos. Es mayor de 107 aos, y el mdico le informa que corre riesgo de tener este tipo de infecciones. La actividad sexual ha cambiado desde que le hicieron la ltima prueba de  deteccin y tiene un riesgo mayor de Best boy clamidia o Radio broadcast assistant. Pregntele al mdico si usted tiene riesgo. Pregntele al mdico si usted tiene un alto riesgo de Museum/gallery curator VIH. El mdico tambin puede recomendarle un medicamento recetado para ayudar a evitar la infeccin por el VIH. Si elige tomar medicamentos para prevenir el VIH, primero debe [employer] de deteccin del VIH. Luego debe hacerse anlisis cada 3 meses mientras est tomando los medicamentos. Embarazo Si est por dejar de Librarian, academic (fase premenopusica) y usted puede quedar [place], busque asesoramiento antes de [place]. Tome de 400 a 800 microgramos (mcg) de cido [employer] si [place]. Pida mtodos de control de la natalidad (anticonceptivos) si desea evitar un embarazo no deseado. Osteoporosis y [place] La osteoporosis es una enfermedad en la que los huesos pierden los minerales y la fuerza por el avance de la edad. El resultado pueden ser fracturas en los [place]. Si tiene 44 aos o ms, o si est en riesgo de sufrir osteoporosis y fracturas, pregunte a su mdico si debe: Hacerse pruebas de deteccin de prdida sea. Tomar un suplemento de calcio o de vitamina D para reducir el riesgo de fracturas. Recibir terapia de reemplazo hormonal (TRH) para tratar los sntomas de la menopausia. Siga estas indicaciones en su casa: Consumo de alcohol No beba alcohol si: Su mdico le indica no hacerlo. Est embarazada, puede estar embarazada o est tratando de [place]. Si bebe alcohol: Limite la cantidad que bebe a lo siguiente: De 0 a 1 bebida por da. Sepa cunta cantidad de alcohol hay en las bebidas que toma. En los [place], una medida equivale a una botella de cerveza de 12 oz (355 ml), un vaso de vino de 5 oz (148 ml) o un vaso de una bebida alcohlica de alta graduacin de 1 oz (44 ml). Estilo de vida No consuma ningn producto que contenga nicotina o tabaco. Estos  productos incluyen cigarrillos, tabaco para Higher education careers adviser y aparatos de vapeo, como los Psychologist, sport and exercise. Si necesita ayuda para dejar de consumir estos productos, consulte al mdico. No consuma drogas. No comparta agujas. Solicite ayuda a su mdico si necesita apoyo o informacin para abandonar las drogas. Indicaciones generales Realcese los estudios de rutina de la salud, dentales y de Public librarian. [place]. Infrmele a su mdico si: Se siente deprimida con frecuencia. Alguna vez ha sido vctima de [place] o no se siente seguro en su casa. Resumen Adoptar un estilo de vida saludable y recibir atencin preventiva son importantes para promover la salud y Musician. Siga las instrucciones del mdico acerca de una dieta saludable, el ejercicio y la realizacin de pruebas o exmenes para Engineer, building services. Siga las instrucciones del mdico con respecto al control del colesterol y la presin arterial. Esta informacin no tiene Marine scientist el consejo del mdico. Asegrese de hacerle al mdico cualquier pregunta que tenga. Document Revised: 01/17/2021 Document Reviewed: 01/17/2021 Elsevier Patient Education  [place].

## 2021-08-22 NOTE — Assessment & Plan Note (Signed)
Diet and nutrition discussed.  Continue atorvastatin 20 mg daily. The 10-year ASCVD risk score (Arnett DK, et al., 2019) is: 1.1%   Values used to calculate the score:     Age: 55 years     Sex: Female     Is Non-Hispanic African American: No     Diabetic: No     Tobacco smoker: No     Systolic Blood Pressure: 060 mmHg     Is BP treated: Yes     HDL Cholesterol: 65 mg/dL     Total Cholesterol: 133 mg/dL

## 2021-08-22 NOTE — Progress Notes (Signed)
Autumn Haley 55 y.o.   Chief Complaint  Patient presents with   Hypertension    Medication refill of lisinopril and crestor   New Patient (Initial Visit)    Needing pcp for BP and cholesterol    HISTORY OF PRESENT ILLNESS: This is a 55 y.o. female with history of hypertension and dyslipidemia here for follow-up. Last seen by me last February. #1 hypertension on lisinopril 30 mg daily. #2 dyslipidemia on atorvastatin 20 mg daily Needs medications refills Needs referral for colonoscopy. Mammogram done on 07/30/2021 within normal limits. Needs flu vaccine. Also has rash/skin lesions to right lower extremity that needs dermatology referral.  Daughter has history of skin cancer. No other complaints or medical concerns today.  Hypertension Pertinent negatives include no chest pain, headaches, palpitations or shortness of breath.    Prior to Admission medications   Medication Sig Start Date End Date Taking? Authorizing Provider  Ascorbic Acid (VITAMIN C) 1000 MG tablet Take 1,000 mg by mouth daily.   Yes [provider]  Calcium Carb-Cholecalciferol (CALCIUM 600+D3 PO) Take by mouth daily.   Yes [provider]  cetirizine (ZYRTEC) 10 MG tablet Take 1 tablet (10 mg total) by mouth at bedtime. 07/05/20  Yes Just, Laurita Quint, FNP  lisinopril (ZESTRIL) 20 MG tablet Take 1 tablet (20 mg total) by mouth daily. 08/22/21  Yes Toleen Lachapelle, Ines Bloomer, MD  atorvastatin (LIPITOR) 20 MG tablet Take 1 tablet (20 mg total) by mouth daily. 08/22/21 08/17/22  Horald Pollen, MD  Cholecalciferol (VITAMIN D3 PO) Take 1,000 Units by mouth daily.     [provider]  fluticasone (FLONASE) 50 MCG/ACT nasal spray Place 2 sprays into both nostrils daily. 07/05/20   Just, Laurita Quint, FNP    Allergies  Allergen Reactions   Levaquin [Levofloxacin] Rash    Patient Active Problem List   Diagnosis Date Noted   Chronic pain of left knee 03/17/2018   Primary osteoarthritis  of left knee 03/17/2018   Pain in finger of both hands 06/17/2017   Osteoarthritis of fingers of hands, bilateral 06/17/2017   Arthralgia 02/23/2017   Vitamin D deficiency 02/11/2017   Hypocalcemia 02/11/2017   Renal dysfunction 07/26/2012   Fibroid uterus 12/26/2011    Past Medical History:  Diagnosis Date   Arthritis    Chronic kidney disease    right kidney   Fibroid    FIBROID UTERUS   NSVD (normal spontaneous vaginal delivery)    X 2   NSVD (normal spontaneous vaginal delivery)    X 2   SAB (spontaneous abortion)    SAB (spontaneous abortion)    Vitamin D deficiency     No past surgical history on file.  Social History   Socioeconomic History   Marital status: Married    Spouse name: Donnie Aho   Number of children: 2   Years of education: 12   Highest education level: Not on file  Occupational History   Occupation: Educational psychologist: Black    Comment: Housekeeping  Tobacco Use   Smoking status: Never   Smokeless tobacco: Never  Vaping Use   Vaping Use: Never used  Substance and Sexual Activity   Alcohol use: No    Comment: OCCASIONALLY   Drug use: No   Sexual activity: Not Currently    Partners: Male    Birth control/protection: Rhythm  Other Topics Concern   Not on file  Social History Narrative   ** Merged History Encounter **  From [place], [place].  Lives here with her husband and 2 children.   Social Determinants of Health   Financial Resource Strain: Not on file  Food Insecurity: Not on file  Transportation Needs: Not on file  Physical Activity: Not on file  Stress: Not on file  Social Connections: Not on file  Intimate Partner Violence: Not on file    No family history on file.   Review of Systems  Constitutional: Negative.  Negative for chills and fever.  HENT: Negative.  Negative for congestion and sore throat.   Respiratory: Negative.  Negative for cough and shortness of breath.   Cardiovascular:  Negative.  Negative for chest pain and palpitations.  Gastrointestinal: Negative.  Negative for abdominal pain, diarrhea, nausea and vomiting.  Genitourinary: Negative.  Negative for dysuria and hematuria.  Skin: Negative.   Neurological: Negative.  Negative for dizziness and headaches.  All other systems reviewed and are negative.  Today's Vitals   08/22/21 1141  BP: 126/70  Pulse: 83  SpO2: 96%  Weight: 143 lb (64.9 kg)   Body mass index is 28.4 kg/m.  Physical Exam Vitals reviewed.  Constitutional:      Appearance: Normal appearance.  HENT:     Head: Normocephalic.     Mouth/Throat:     Mouth: Mucous membranes are moist.     Pharynx: Oropharynx is clear.  Eyes:     Extraocular Movements: Extraocular movements intact.     Conjunctiva/sclera: Conjunctivae normal.     Pupils: Pupils are equal, round, and reactive to light.  Cardiovascular:     Rate and Rhythm: Normal rate and regular rhythm.     Pulses: Normal pulses.     Heart sounds: Normal heart sounds.  Pulmonary:     Effort: Pulmonary effort is normal.     Breath sounds: Normal breath sounds.  Abdominal:     General: There is no distension.     Palpations: Abdomen is soft.     Tenderness: There is no abdominal tenderness.  Musculoskeletal:        General: Normal range of motion.     Cervical back: Normal range of motion and neck supple.  Skin:    General: Skin is warm and dry.     Capillary Refill: Capillary refill takes less than 2 seconds.     Findings: Lesion (Right lower extremity, see picture below) present.  Neurological:     General: No focal deficit present.     Mental Status: She is alert and oriented to person, place, and time.  Psychiatric:        Mood and Affect: Mood normal.        Behavior: Behavior normal.      ASSESSMENT & PLAN: Problem List Items Addressed This Visit       Cardiovascular and Mediastinum   Essential hypertension - Primary    Well-controlled hypertension.  Continue  lisinopril 20 mg daily. BP Readings from Last 3 Encounters:  08/22/21 126/70  05/14/21 (!) 154/80  04/09/21 118/78         Relevant Medications   atorvastatin (LIPITOR) 20 MG tablet   lisinopril (ZESTRIL) 20 MG tablet   Other Relevant Orders   Comprehensive metabolic panel   Hemoglobin A1c   CBC with Differential/Platelet     Musculoskeletal and Integument   Skin lesion of right lower extremity    Needs dermatology evaluation.  Referral placed today.      Relevant Orders   Ambulatory referral to Dermatology  Other   Dyslipidemia    Diet and nutrition discussed.  Continue atorvastatin 20 mg daily. The 10-year ASCVD risk score (Arnett DK, et al., 2019) is: 1.1%   Values used to calculate the score:     Age: 10 years     Sex: Female     Is Non-Hispanic African American: No     Diabetic: No     Tobacco smoker: No     Systolic Blood Pressure: 794 mmHg     Is BP treated: Yes     HDL Cholesterol: 65 mg/dL     Total Cholesterol: 133 mg/dL       Relevant Medications   atorvastatin (LIPITOR) 20 MG tablet   Other Relevant Orders   Hemoglobin A1c   Lipid panel   CBC with Differential/Platelet   Other Visit Diagnoses     Flu vaccine need       Relevant Orders   Flu Vaccine QUAD 66moIM (Fluarix, Fluzone & Alfiuria Quad PF)   Colon cancer screening       Relevant Orders   Ambulatory referral to Gastroenterology      Patient Instructions  Mantenimiento de la salud en lNew LondonMaintenance, Female Adoptar un estilo de vida saludable y recibir atencin preventiva son importantes para promover la salud y eMusician Consulte al mdico sobre: El esquema adecuado para hacerse pruebas y exmenes peridicos. Cosas que puede hacer por su cuenta para prevenir enfermedades y mEustissano. Qu debo saber sobre la dieta, el peso y el ejercicio? Consuma una dieta saludable  Consuma una dieta que incluya muchas verduras, frutas, productos lcteos con bajo  contenido de gDjiboutiy pAdvertising account planner No consuma muchos alimentos ricos en grasas slidas, azcares agregados o sodio. Mantenga un peso saludable El ndice de masa muscular ([facility] se uSouth Georgia and the South Sandwich Islandspara identificar problemas de pMoraga Proporciona una estimacin de la grasa corporal basndose en el peso y la altura. Su mdico puede ayudarle a dRadiation protection practitionerICharles Mixy a lScientist, forensico mTheatre managerun peso saludable. Haga ejercicio con regularidad Haga ejercicio con regularidad. Esta es una de las prcticas ms importantes que puede hacer por su salud. La mState Farmde los adultos deben seguir estas pautas: ROptometrist al menos, 150 minutos de actividad fsica por semana. El ejercicio debe aumentar la frecuencia cardaca y hNature conservation officertranspirar (ejercicio de intensidad moderada). Hacer ejercicios de fortalecimiento por lo mHalliburton Companypor semana. Agregue esto a su plan de ejercicio de intensidad moderada. Pase menos tiempo sentada. Incluso la actividad fsica ligera puede ser beneficiosa. Controle sus niveles de colesterol y lpidos en la sangre Comience a realizarse anlisis de lpidos y cResearch officer, trade unionen la sangre a los 275aos y luego reptalos cada 5 aos. Hgase controlar los niveles de colesterol con mayor frecuencia si: Sus niveles de lpidos y colesterol son altos. Es mayor de 460aos. Presenta un alto riesgo de padecer enfermedades cardacas. Qu debo saber sobre las pruebas de deteccin del cncer? Segn su historia clnica y sus antecedentes familiares, es posible que deba realizarse pruebas de deteccin del cncer en diferentes edades. Esto puede incluir pruebas de deteccin de lo siguiente: Cncer de mama. Cncer de cuello uterino. Cncer colorrectal. Cncer de piel. Cncer de pulmn. Qu debo saber sobre la enfermedad cardaca, la diabetes y la hipertensin arterial? Presin arterial y enfermedad cardaca La hipertensin arterial causa enfermedades cardacas y aSerbiael riesgo de accidente cerebrovascular. Es  ms probable que esto se manifieste en las personas que tienen lecturas de presin arterial alta  o tienen sobrepeso. Hgase controlar la presin arterial: Cada 3 a 5 aos si tiene entre 18 y 68 aos. Todos los aos si es mayor de 40 aos. Diabetes Realcese exmenes de deteccin de la diabetes con regularidad. Este anlisis revisa el nivel de azcar en la sangre en [place]. Hgase las pruebas de deteccin: Cada tres aos despus de los 85 aos de edad si tiene un peso normal y un bajo riesgo de padecer diabetes. Con ms frecuencia y a partir de Conrad edad inferior si tiene sobrepeso o un alto riesgo de padecer diabetes. Qu debo saber sobre la prevencin de infecciones? Hepatitis B Si tiene un riesgo ms alto de contraer hepatitis B, debe someterse a un examen de deteccin de este virus. Hable con el mdico para averiguar si tiene riesgo de contraer la infeccin por hepatitis B. Hepatitis C Se recomienda el anlisis a: [employer] 1945 y 1965. Todas las personas que tengan un riesgo de haber contrado hepatitis C. Enfermedades de transmisin sexual (ETS) Hgase las pruebas de Programme researcher, broadcasting/film/video de ITS, incluidas la gonorrea y la clamidia, si: Es sexualmente activa y es menor de 22 aos. Es mayor de 22 aos, y Investment banker, operational informa que corre riesgo de tener este tipo de infecciones. La actividad sexual ha cambiado desde que le hicieron la ltima prueba de deteccin y tiene un riesgo mayor de Best boy clamidia o Radio broadcast assistant. Pregntele al mdico si usted tiene riesgo. Pregntele al mdico si usted tiene un alto riesgo de Museum/gallery curator VIH. El mdico tambin puede recomendarle un medicamento recetado para ayudar a evitar la infeccin por el VIH. Si elige tomar medicamentos para prevenir el VIH, primero debe [employer] de deteccin del VIH. Luego debe hacerse anlisis cada 3 meses mientras est tomando los medicamentos. Embarazo Si est por dejar de Librarian, academic (fase premenopusica) y usted puede  quedar [place], busque asesoramiento antes de [place]. Tome de 400 a 800 microgramos (mcg) de cido [employer] si [place]. Pida mtodos de control de la natalidad (anticonceptivos) si desea evitar un embarazo no deseado. Osteoporosis y [place] La osteoporosis es una enfermedad en la que los huesos pierden los minerales y la fuerza por el avance de la edad. El resultado pueden ser fracturas en los [place]. Si tiene 37 aos o ms, o si est en riesgo de sufrir osteoporosis y fracturas, pregunte a su mdico si debe: Hacerse pruebas de deteccin de prdida sea. Tomar un suplemento de calcio o de vitamina D para reducir el riesgo de fracturas. Recibir terapia de reemplazo hormonal (TRH) para tratar los sntomas de la menopausia. Siga estas indicaciones en su casa: Consumo de alcohol No beba alcohol si: Su mdico le indica no hacerlo. Est embarazada, puede estar embarazada o est tratando de [place]. Si bebe alcohol: Limite la cantidad que bebe a lo siguiente: De 0 a 1 bebida por da. Sepa cunta cantidad de alcohol hay en las bebidas que toma. En los [place], una medida equivale a una botella de cerveza de 12 oz (355 ml), un vaso de vino de 5 oz (148 ml) o un vaso de una bebida alcohlica de alta graduacin de 1 oz (44 ml). Estilo de vida No consuma ningn producto que contenga nicotina o tabaco. Estos productos incluyen cigarrillos, tabaco para Higher education careers adviser y aparatos de vapeo, como los Psychologist, sport and exercise. Si necesita ayuda para dejar de consumir estos productos, consulte al mdico. No consuma drogas. No comparta agujas. Solicite ayuda a su mdico si  necesita apoyo o informacin para abandonar las drogas. Indicaciones generales Realcese los estudios de rutina de la salud, dentales y de Public librarian. [place]. Infrmele a su mdico si: Se siente deprimida con frecuencia. Alguna vez ha sido vctima de [place] o no se  siente seguro en su casa. Resumen Adoptar un estilo de vida saludable y recibir atencin preventiva son importantes para promover la salud y Musician. Siga las instrucciones del mdico acerca de una dieta saludable, el ejercicio y la realizacin de pruebas o exmenes para Engineer, building services. Siga las instrucciones del mdico con respecto al control del colesterol y la presin arterial. Esta informacin no tiene Marine scientist el consejo del mdico. Asegrese de hacerle al mdico cualquier pregunta que tenga. Document Revised: 01/17/2021 Document Reviewed: 01/17/2021 Elsevier Patient Education  2022 [place], MD [facility]

## 2021-08-22 NOTE — Assessment & Plan Note (Signed)
Needs dermatology evaluation.  Referral placed today.

## 2021-08-25 ENCOUNTER — Other Ambulatory Visit: Payer: Self-pay | Admitting: Emergency Medicine

## 2021-08-25 DIAGNOSIS — E1165 Type 2 diabetes mellitus with hyperglycemia: Secondary | ICD-10-CM

## 2021-08-25 MED ORDER — METFORMIN HCL 500 MG PO TABS: 500.0000 mg | ORAL_TABLET | Freq: Two times a day (BID) | ORAL | 3 refills | Status: DC

## 2021-10-29 DIAGNOSIS — I872 Venous insufficiency (chronic) (peripheral): Secondary | ICD-10-CM | POA: Diagnosis not present

## 2021-11-26 ENCOUNTER — Ambulatory Visit (INDEPENDENT_AMBULATORY_CARE_PROVIDER_SITE_OTHER): Payer: BC Managed Care – PPO | Admitting: Emergency Medicine

## 2021-11-26 ENCOUNTER — Telehealth: Payer: Self-pay | Admitting: *Deleted

## 2021-11-26 ENCOUNTER — Encounter: Payer: Self-pay | Admitting: Emergency Medicine

## 2021-11-26 VITALS — BP 130/70 | HR 71 | Temp 97.8°F | Ht 59.5 in | Wt 144.4 lb

## 2021-11-26 DIAGNOSIS — E1169 Type 2 diabetes mellitus with other specified complication: Secondary | ICD-10-CM

## 2021-11-26 DIAGNOSIS — E1159 Type 2 diabetes mellitus with other circulatory complications: Secondary | ICD-10-CM

## 2021-11-26 DIAGNOSIS — I10 Essential (primary) hypertension: Secondary | ICD-10-CM | POA: Diagnosis not present

## 2021-11-26 DIAGNOSIS — E785 Hyperlipidemia, unspecified: Secondary | ICD-10-CM | POA: Diagnosis not present

## 2021-11-26 DIAGNOSIS — I152 Hypertension secondary to endocrine disorders: Secondary | ICD-10-CM

## 2021-11-26 DIAGNOSIS — M778 Other enthesopathies, not elsewhere classified: Secondary | ICD-10-CM | POA: Diagnosis not present

## 2021-11-26 DIAGNOSIS — I872 Venous insufficiency (chronic) (peripheral): Secondary | ICD-10-CM | POA: Diagnosis not present

## 2021-11-26 DIAGNOSIS — Z1211 Encounter for screening for malignant neoplasm of colon: Secondary | ICD-10-CM

## 2021-11-26 MED ORDER — MELOXICAM 15 MG PO TABS: ORAL_TABLET | ORAL | 0 refills | Status: DC

## 2021-11-26 NOTE — Assessment & Plan Note (Signed)
Stable.  Continue atorvastatin 20 mg daily. ?The 10-year ASCVD risk score (Arnett DK, et al., 2019) is: 4% ?  Values used to calculate the score: ?    Age: 56 years ?    Sex: Female ?    Is Non-Hispanic African American: No ?    Diabetic: Yes ?    Tobacco smoker: No ?    Systolic Blood Pressure: 829 mmHg ?    Is BP treated: Yes ?    HDL Cholesterol: 61.4 mg/dL ?    Total Cholesterol: 203 mg/dL ? ?

## 2021-11-26 NOTE — Telephone Encounter (Signed)
Just once a day.  Thanks.

## 2021-11-26 NOTE — Assessment & Plan Note (Signed)
Stable and asymptomatic.  Stasis dermatitis changes of the skin. ?Does not recommend to start vasculera. ?

## 2021-11-26 NOTE — Patient Instructions (Signed)
Tendinitis ?Tendinitis ?La tendinitis es la irritaci?n e hinchaz?n (inflamaci?n) de un tend?n. Un tend?n es un cord?n de tejido que conecta el m?sculo con el hueso. La tendinitis es m?s frecuente en el hombro, el tobillo, el codo o la [place]. ??Cu?les son las causas? ?Usar demasiado un tend?n o m?sculo (uso excesivo). Esta es la causa m?s frecuente. ?El desgaste normal que ocurre con la edad. ?Lesiones. ?Algunas afecciones m?dicas, como la artritis. ?Algunos medicamentos. ??Qu? incrementa el riesgo? ?Tiene m?s probabilidades de padecer esta afecci?n si usted realiza actividades que [employer] mismos movimientos una y Elmon Kirschner (repetitivos). ??Cu?les son los signos o los s?ntomas? ?Dolor. ?Sensibilidad. ?Inflamaci?n leve. ?Disminuci?n de la amplitud de movimientos. ??C?mo se trata? ?Por lo general, esta afecci?n se trata con terapia RICE. La sigla RICE significa lo siguiente: ?Reposo. ?Hielo. ?Compresi?n. Esto implica ejercer presi?n sobre la zona afectada. ?Elevaci?n. Esto significa elevar la zona afectada por encima del nivel del coraz?n. ?El tratamiento tambi?n puede incluir lo siguiente: ?Medicamentos para Conservation officer, historic buildings o la hinchaz?n. ?Ejercicios o fisioterapia para ayudar a que el tend?n se mueva mejor y se fortalezca. ?Un dispositivo ortop?dico o una f?rula. ?Una inyecci?n de un tipo de medicamento llamado corticoesteroide. ?Una cirug?a. Esto debe realizarse en contadas ocasiones. ?Siga estas instrucciones en su casa: ?Si tiene una f?rula o un dispositivo ortop?dico que se puede retirar: ?Use la f?rula o el dispositivo ortop?dico como se lo haya indicado el m?dico. Qu?teselos solamente como se lo haya indicado el m?dico. ?Controle todos los d?as la piel alrededor de la f?rula o el dispositivo ortop?dico. Comun?quese con su m?dico si ve alg?n problema. ?Afloje la f?rula o el dispositivo ortop?dico si los dedos de la mano o de los pies: ?Hormiguean. ?Se adormecen. ?Se tornan fr?os y de [employer]. ?Mantenga la  f?rula o el dispositivo ortop?dico limpios. ?Si la f?rula o el dispositivo ortop?dico no son impermeables: ?No deje que se mojen. ?C?bralos con un envoltorio impermeable cuando tome un ba?o de inmersi?n o [place], o qu?teselos como se lo haya indicado el m?dico. ?Control del dolor, la rigidez y la hinchaz?n ?  ?Si se lo indican, aplique hielo en la zona afectada. Para hacer esto: ?Si tiene una f?rula desmontable o dispositivo ortop?dico, qu?teselos como se lo haya indicado el m?dico. ?Ponga el hielo en una bolsa pl?stica. ?Coloque una [employer] piel y Therapist, nutritional. ?Aplique el hielo durante 20 minutos, 2 a 3 veces por d?a. ?Retire el hielo si la piel se le pone de color rojo brillante. Esto es [employer]. Si no puede sentir dolor, calor o fr?o, tiene un mayor riesgo de que se da?e la zona. ?Mueva los dedos de las manos o de los pies del brazo o la pierna afectados con frecuencia, si esto corresponde. ?Si se lo indican, levante la zona afectada por encima del nivel del coraz?n mientras est? sentado o acostado. ?Si se lo indican, aplique calor en la zona afectada antes de hacer ejercicio. Use la fuente de calor que el m?dico le recomiende, como una compresa de calor h?medo o una almohadilla t?rmica. ?Coloque una [employer] piel y la fuente de Freight forwarder. ?Aplique calor durante 20 a 30 minutos. ?Retire la fuente de calor si la piel se le pone de color rojo brillante. Esto es [employer]. Si no puede sentir dolor, calor o fr?o, tiene un mayor riesgo de [place]. ?Actividad ?Haga reposo a fin de Paramedic zona afectada, como se lo haya indicado el m?dico. ?Preg?ntele al m?dico cu?ndo puede  volver a conducir si tiene una f?rula o un dispositivo ortop?dico en alg?n lugar del brazo o la pierna. ?Retome sus actividades normales cuando el m?dico le diga que es seguro. ?Evite usar la zona afectada mientras presenta s?ntomas. ?Haga los [employer] se lo haya indicado el m?dico. ?Instrucciones generales ?Use  una venda el?stica o que ejerza presi?n (de compresi?n) ?nicamente como se lo haya indicado el m?dico. ?Use los medicamentos de venta libre y los recetados solamente como se lo haya indicado el m?dico. ?Concurra a [place]. ?Comun?quese con un m?dico si: ?No se siente mejor. ?Tiene nuevos problemas, como entumecimiento en las manos o los pies, y no sabe por qu?Marland Kitchen ?Resumen ?La tendinitis es la irritaci?n e hinchaz?n (inflamaci?n) de un tend?n. ?Tiene m?s probabilidades de padecer esta afecci?n si usted realiza actividades que [employer] mismos movimientos una y [place]. ?Por lo general, esta afecci?n se trata con terapia RICE. RICE significa reposo, hielo, compresi?n y elevaci?n. ?Evite usar la zona afectada mientras presenta s?ntomas. ?Esta informaci?n no tiene Marine scientist el consejo del m?dico. Aseg?rese de hacerle al m?dico cualquier pregunta que tenga. ?Document Revised: 05/08/2021 Document Reviewed: 05/08/2021 ?Elsevier Patient Education ? [place]. ? ?

## 2021-11-26 NOTE — Assessment & Plan Note (Addendum)
Well-controlled hypertension with normal blood pressure readings at home.  Continue lisinopril 20 mg daily.  Dietary approaches to stop hypertension discussed. ?Well-controlled diabetes with hemoglobin A1c at 6.6. ?Continue metformin 500 mg twice a day. ?Diet and nutrition discussed. ?

## 2021-11-26 NOTE — Progress Notes (Signed)
Autumn Haley ?56 y.o. ? ? ?Chief Complaint  ?Patient presents with  ? Follow-up  ? Medication Management  ?  Questions about vasculera rx   ? ? ?HISTORY OF PRESENT ILLNESS: ?This is a 56 y.o. female seen by me last on 08/22/2021 for hypertension diabetes and dyslipidemia. ?Found to have rash to lower extremities secondary to chronic venous insufficiency ?Patient was seen by dermatologist who recommended to start vasculera ?Patient did not start new medication.  States that she does not have any pain, swelling, discomfort, numbness or tingling.  Only skin discoloration secondary to stasis dermatitis.  Does not want to start taking medication if she does not really need it. ?Also complaining of right elbow pain for the past 3 weeks.  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 ? ?HPI ? ? ?Prior to Admission medications   ?Medication Sig Start Date End Date Taking? Authorizing Provider  ?Ascorbic Acid (VITAMIN C) 1000 MG tablet Take 1,000 mg by mouth daily.   Yes [provider]  ?atorvastatin (LIPITOR) 20 MG tablet Take 1 tablet (20 mg total) by mouth daily. 08/22/21 08/17/22 Yes Autumn Bloomer, MD  ?Calcium Carb-Cholecalciferol (CALCIUM 600+D3 PO) Take by mouth daily.   Yes [provider]  ?cetirizine (ZYRTEC) 10 MG tablet Take 1 tablet (10 mg total) by mouth at bedtime. 07/05/20  Yes Just, Laurita Quint, FNP  ?Cholecalciferol (VITAMIN D3 PO) Take 1,000 Units by mouth daily.    Yes [provider]  ?fluticasone (FLONASE) 50 MCG/ACT nasal spray Place 2 sprays into both nostrils daily. 07/05/20  Yes Just, Laurita Quint, FNP  ?lisinopril (ZESTRIL) 20 MG tablet Take 1 tablet (20 mg total) by mouth daily. 08/22/21  Yes Leshaun Biebel, Autumn Bloomer, MD  ?meloxicam (MOBIC) 15 MG tablet Take daily for 2 weeks and then as needed. 11/26/21  Yes Manraj Yeo, Autumn Bloomer, MD  ?metFORMIN (GLUCOPHAGE) 500 MG tablet Take 1 tablet (500 mg total) by mouth 2 (two) times daily with a meal.  08/25/21  Yes Lavi Sheehan, Autumn Bloomer, MD  ?triamcinolone ointment (KENALOG) 0.1 % Apply topically. 10/29/21  Yes [provider]  ? ? ?Allergies  ?Allergen Reactions  ? Levaquin [Levofloxacin] Rash  ? ? ?Patient Active Problem List  ? Diagnosis Date Noted  ? Skin lesion of right lower extremity 08/22/2021  ? Essential hypertension 08/22/2021  ? Dyslipidemia 08/22/2021  ? Chronic pain of left knee 03/17/2018  ? Primary osteoarthritis of left knee 03/17/2018  ? Osteoarthritis of fingers of hands, bilateral 06/17/2017  ? Arthralgia 02/23/2017  ? Vitamin D deficiency 02/11/2017  ? Fibroid uterus 12/26/2011  ? ? ?Past Medical History:  ?Diagnosis Date  ? Arthritis   ? Chronic kidney disease   ? right kidney  ? Fibroid   ? FIBROID UTERUS  ? NSVD (normal spontaneous vaginal delivery)   ? X 2  ? NSVD (normal spontaneous vaginal delivery)   ? X 2  ? SAB (spontaneous abortion)   ? SAB (spontaneous abortion)   ? Vitamin D deficiency   ? ? ?No past surgical history on file. ? ?Social History  ? ?Socioeconomic History  ? Marital status: Married  ?  Spouse name: Donnie Aho  ? Number of children: 2  ? Years of education: 57  ? Highest education level: Not on file  ?Occupational History  ? Occupation: ASSISTANT SUPERVISOR  ?  Employer: [employer]  ?  Comment: Housekeeping  ?Tobacco Use  ? Smoking status: Never  ? Smokeless tobacco: Never  ?  Vaping Use  ? Vaping Use: Never used  ?Substance and Sexual Activity  ? Alcohol use: No  ?  Comment: OCCASIONALLY  ? Drug use: No  ? Sexual activity: Not Currently  ?  Partners: Male  ?  Birth control/protection: Rhythm  ?Other Topics Concern  ? Not on file  ?Social History Narrative  ? ** Merged History Encounter **  ?    ? From [place], [place].  Lives here with her husband and 2 children.  ? ?Social Determinants of Health  ? ?Financial Resource Strain: Not on file  ?Food Insecurity: Not on file  ?Transportation Needs: Not on file  ?Physical Activity: Not on file  ?Stress: Not on file   ?Social Connections: Not on file  ?Intimate Partner Violence: Not on file  ? ? ?No family history on file. ? ? ?Review of Systems  ?Constitutional: Negative.  Negative for chills and fever.  ?HENT: Negative.  Negative for congestion.   ?Respiratory: Negative.  Negative for cough and shortness of breath.   ?Cardiovascular:  Negative for chest pain and palpitations.  ?Gastrointestinal: Negative.  Negative for abdominal pain, nausea and vomiting.  ?Genitourinary: Negative.  Negative for dysuria and hematuria.  ?Musculoskeletal: Negative.   ?Skin: Negative.   ?All other systems reviewed and are negative. ?Today's Vitals  ? 11/26/21 1024 11/26/21 1128  ?BP: (!) 144/90 130/70  ?Pulse: 71   ?Temp: 97.8 ?F (36.6 ?C)   ?TempSrc: Oral   ?SpO2: 99%   ?Weight: 144 lb 6 oz (65.5 kg)   ?Height: 4' 11.5" (1.511 m)   ? ?Body mass index is 28.67 kg/m?. ? ? ?Physical Exam ?Vitals reviewed.  ?Constitutional:   ?   Appearance: Normal appearance.  ?HENT:  ?   Head: Normocephalic.  ?Eyes:  ?   Extraocular Movements: Extraocular movements intact.  ?   Pupils: Pupils are equal, round, and reactive to light.  ?Cardiovascular:  ?   Rate and Rhythm: Normal rate and regular rhythm.  ?Pulmonary:  ?   Effort: Pulmonary effort is normal.  ?   Breath sounds: Normal breath sounds.  ?Musculoskeletal:  ?   Comments: Right elbow: Full range of motion.  Positive tenderness to proximal forearm tendon ?No tenderness at the epicondyles  ?Skin: ?   General: Skin is warm and dry.  ?   Capillary Refill: Capillary refill takes less than 2 seconds.  ?   Comments: Stasis dermatitis skin changes to both ankle areas right more than left ?No swelling to lower extremities, no erythema, no tenderness.  Excellent distal pulses and sensation.  ?Neurological:  ?   General: No focal deficit present.  ?   Mental Status: She is alert and oriented to person, place, and time.  ?Psychiatric:     ?   Mood and Affect: Mood normal.     ?   Behavior: Behavior normal.   ? ? ? ?ASSESSMENT & PLAN: ?Problem List Items Addressed This Visit   ? ?  ? Cardiovascular and Mediastinum  ? Hypertension associated with diabetes ([place])  ?  Well-controlled hypertension with normal blood pressure readings at home.  Continue lisinopril 20 mg daily.  Dietary approaches to stop hypertension discussed. ?Well-controlled diabetes with hemoglobin A1c at 6.6. ?Continue metformin 500 mg twice a day. ?Diet and nutrition discussed. ?  ?  ? Chronic venous insufficiency - Primary  ?  Stable and asymptomatic.  Stasis dermatitis changes of the skin. ?Does not recommend to start vasculera. ?  ?  ?  ?  Endocrine  ? Dyslipidemia associated with type 2 diabetes mellitus ([place])  ?  Stable.  Continue atorvastatin 20 mg daily. ?The 10-year ASCVD risk score (Arnett DK, et al., 2019) is: 4% ?  Values used to calculate the score: ?    Age: 88 years ?    Sex: Female ?    Is Non-Hispanic African American: No ?    Diabetic: Yes ?    Tobacco smoker: No ?    Systolic Blood Pressure: 505 mmHg ?    Is BP treated: Yes ?    HDL Cholesterol: 61.4 mg/dL ?    Total Cholesterol: 203 mg/dL ? ?  ?  ?  ? Musculoskeletal and Integument  ? Right elbow tendinitis  ?  Related to activities of daily life.  May take meloxicam for 2 weeks and then as needed. ?  ?  ? Relevant Medications  ? meloxicam (MOBIC) 15 MG tablet  ?  ? Other  ? Dyslipidemia  ? ?Other Visit Diagnoses   ? ? Colon cancer screening      ? Relevant Orders  ? Ambulatory referral to Gastroenterology  ? ?  ? ?Patient Instructions  ?Tendinitis ?Tendinitis ?La tendinitis es la irritaci?n e hinchaz?n (inflamaci?n) de un tend?n. Un tend?n es un cord?n de tejido que conecta el m?sculo con el hueso. La tendinitis es m?s frecuente en el hombro, el tobillo, el codo o la [place]. ??Cu?les son las causas? ?Usar demasiado un tend?n o m?sculo (uso excesivo). Esta es la causa m?s frecuente. ?El desgaste normal que ocurre con la edad. ?Lesiones. ?Algunas afecciones m?dicas, como la  artritis. ?Algunos medicamentos. ??Qu? incrementa el riesgo? ?Tiene m?s probabilidades de padecer esta afecci?n si usted realiza actividades que [employer] mismos movimientos una y Elmon Kirschner (repetitivos). ??Cu?les son los signos o

## 2021-11-26 NOTE — Telephone Encounter (Signed)
Called pharmacy to provider verbal clarification regarding Meloxicam rx. ?

## 2021-11-26 NOTE — Telephone Encounter (Signed)
Pharmacy is needing verification on how many tabs the patient should take daily of the meloxicam. Please advise  ? ? ?

## 2021-11-26 NOTE — Assessment & Plan Note (Signed)
Related to activities of daily life.  May take meloxicam for 2 weeks and then as needed. ?

## 2022-04-20 DIAGNOSIS — L97919 Non-pressure chronic ulcer of unspecified part of right lower leg with unspecified severity: Secondary | ICD-10-CM | POA: Diagnosis not present

## 2022-04-20 DIAGNOSIS — I872 Venous insufficiency (chronic) (peripheral): Secondary | ICD-10-CM | POA: Diagnosis not present

## 2022-04-20 DIAGNOSIS — L03115 Cellulitis of right lower limb: Secondary | ICD-10-CM | POA: Diagnosis not present

## 2022-04-22 DIAGNOSIS — I8311 Varicose veins of right lower extremity with inflammation: Secondary | ICD-10-CM | POA: Diagnosis not present

## 2022-04-22 DIAGNOSIS — I83203 Varicose veins of unspecified lower extremity with both ulcer of ankle and inflammation: Secondary | ICD-10-CM | POA: Diagnosis not present

## 2022-04-22 DIAGNOSIS — I8312 Varicose veins of left lower extremity with inflammation: Secondary | ICD-10-CM | POA: Diagnosis not present

## 2022-04-22 DIAGNOSIS — L97211 Non-pressure chronic ulcer of right calf limited to breakdown of skin: Secondary | ICD-10-CM | POA: Diagnosis not present

## 2022-04-30 DIAGNOSIS — I83203 Varicose veins of unspecified lower extremity with both ulcer of ankle and inflammation: Secondary | ICD-10-CM | POA: Diagnosis not present

## 2022-04-30 DIAGNOSIS — I8311 Varicose veins of right lower extremity with inflammation: Secondary | ICD-10-CM | POA: Diagnosis not present

## 2022-04-30 DIAGNOSIS — L97211 Non-pressure chronic ulcer of right calf limited to breakdown of skin: Secondary | ICD-10-CM | POA: Diagnosis not present

## 2022-04-30 DIAGNOSIS — I8312 Varicose veins of left lower extremity with inflammation: Secondary | ICD-10-CM | POA: Diagnosis not present

## 2022-05-06 DIAGNOSIS — L97211 Non-pressure chronic ulcer of right calf limited to breakdown of skin: Secondary | ICD-10-CM | POA: Diagnosis not present

## 2022-05-06 DIAGNOSIS — I83203 Varicose veins of unspecified lower extremity with both ulcer of ankle and inflammation: Secondary | ICD-10-CM | POA: Diagnosis not present

## 2022-05-06 DIAGNOSIS — I8311 Varicose veins of right lower extremity with inflammation: Secondary | ICD-10-CM | POA: Diagnosis not present

## 2022-06-30 DIAGNOSIS — L97211 Non-pressure chronic ulcer of right calf limited to breakdown of skin: Secondary | ICD-10-CM | POA: Diagnosis not present

## 2022-06-30 DIAGNOSIS — I83203 Varicose veins of unspecified lower extremity with both ulcer of ankle and inflammation: Secondary | ICD-10-CM | POA: Diagnosis not present

## 2022-06-30 DIAGNOSIS — I8311 Varicose veins of right lower extremity with inflammation: Secondary | ICD-10-CM | POA: Diagnosis not present

## 2022-07-04 DIAGNOSIS — I8311 Varicose veins of right lower extremity with inflammation: Secondary | ICD-10-CM | POA: Diagnosis not present

## 2022-07-29 DIAGNOSIS — I8312 Varicose veins of left lower extremity with inflammation: Secondary | ICD-10-CM | POA: Diagnosis not present

## 2022-08-05 DIAGNOSIS — I83203 Varicose veins of unspecified lower extremity with both ulcer of ankle and inflammation: Secondary | ICD-10-CM | POA: Diagnosis not present

## 2022-08-05 DIAGNOSIS — I8312 Varicose veins of left lower extremity with inflammation: Secondary | ICD-10-CM | POA: Diagnosis not present

## 2022-08-05 DIAGNOSIS — I8311 Varicose veins of right lower extremity with inflammation: Secondary | ICD-10-CM | POA: Diagnosis not present

## 2022-08-05 DIAGNOSIS — L97211 Non-pressure chronic ulcer of right calf limited to breakdown of skin: Secondary | ICD-10-CM | POA: Diagnosis not present

## 2022-08-06 DIAGNOSIS — I8311 Varicose veins of right lower extremity with inflammation: Secondary | ICD-10-CM | POA: Diagnosis not present

## 2022-08-06 DIAGNOSIS — I8312 Varicose veins of left lower extremity with inflammation: Secondary | ICD-10-CM | POA: Diagnosis not present

## 2022-08-14 ENCOUNTER — Other Ambulatory Visit: Payer: Self-pay | Admitting: Emergency Medicine

## 2022-08-14 DIAGNOSIS — E785 Hyperlipidemia, unspecified: Secondary | ICD-10-CM

## 2022-08-14 DIAGNOSIS — I10 Essential (primary) hypertension: Secondary | ICD-10-CM

## 2022-09-02 DIAGNOSIS — I83203 Varicose veins of unspecified lower extremity with both ulcer of ankle and inflammation: Secondary | ICD-10-CM | POA: Diagnosis not present

## 2022-09-02 DIAGNOSIS — L97211 Non-pressure chronic ulcer of right calf limited to breakdown of skin: Secondary | ICD-10-CM | POA: Diagnosis not present

## 2022-09-16 ENCOUNTER — Encounter: Payer: Self-pay | Admitting: Emergency Medicine

## 2022-09-16 ENCOUNTER — Ambulatory Visit (INDEPENDENT_AMBULATORY_CARE_PROVIDER_SITE_OTHER): Payer: BC Managed Care – PPO | Admitting: Emergency Medicine

## 2022-09-16 VITALS — BP 138/88 | HR 57 | Temp 98.1°F | Ht 59.5 in | Wt 142.1 lb

## 2022-09-16 DIAGNOSIS — Z130 Encounter for screening for diseases of the blood and blood-forming organs and certain disorders involving the immune mechanism: Secondary | ICD-10-CM

## 2022-09-16 DIAGNOSIS — I10 Essential (primary) hypertension: Secondary | ICD-10-CM

## 2022-09-16 DIAGNOSIS — I872 Venous insufficiency (chronic) (peripheral): Secondary | ICD-10-CM

## 2022-09-16 DIAGNOSIS — E785 Hyperlipidemia, unspecified: Secondary | ICD-10-CM

## 2022-09-16 DIAGNOSIS — E1159 Type 2 diabetes mellitus with other circulatory complications: Secondary | ICD-10-CM

## 2022-09-16 DIAGNOSIS — Z13228 Encounter for screening for other metabolic disorders: Secondary | ICD-10-CM

## 2022-09-16 DIAGNOSIS — Z1329 Encounter for screening for other suspected endocrine disorder: Secondary | ICD-10-CM

## 2022-09-16 DIAGNOSIS — Z0000 Encounter for general adult medical examination without abnormal findings: Secondary | ICD-10-CM | POA: Diagnosis not present

## 2022-09-16 DIAGNOSIS — Z1211 Encounter for screening for malignant neoplasm of colon: Secondary | ICD-10-CM

## 2022-09-16 DIAGNOSIS — I152 Hypertension secondary to endocrine disorders: Secondary | ICD-10-CM | POA: Diagnosis not present

## 2022-09-16 DIAGNOSIS — Z13220 Encounter for screening for lipoid disorders: Secondary | ICD-10-CM

## 2022-09-16 DIAGNOSIS — E1169 Type 2 diabetes mellitus with other specified complication: Secondary | ICD-10-CM | POA: Diagnosis not present

## 2022-09-16 LAB — CBC WITH DIFFERENTIAL/PLATELET
Basophils Absolute: 0 10*3/uL (ref 0.0–0.1)
Basophils Relative: 0.6 % (ref 0.0–3.0)
Eosinophils Absolute: 0.1 10*3/uL (ref 0.0–0.7)
Eosinophils Relative: 1.1 % (ref 0.0–5.0)
HCT: 40.3 % (ref 36.0–46.0)
Hemoglobin: 13.4 g/dL (ref 12.0–15.0)
Lymphocytes Relative: 36.4 % (ref 12.0–46.0)
Lymphs Abs: 3.1 10*3/uL (ref 0.7–4.0)
MCHC: 33.3 g/dL (ref 30.0–36.0)
MCV: 89.3 fl (ref 78.0–100.0)
Monocytes Absolute: 0.7 10*3/uL (ref 0.1–1.0)
Monocytes Relative: 8.1 % (ref 3.0–12.0)
Neutro Abs: 4.6 10*3/uL (ref 1.4–7.7)
Neutrophils Relative %: 53.8 % (ref 43.0–77.0)
Platelets: 288 10*3/uL (ref 150.0–400.0)
RBC: 4.52 Mil/uL (ref 3.87–5.11)
RDW: 13.6 % (ref 11.5–15.5)
WBC: 8.6 10*3/uL (ref 4.0–10.5)

## 2022-09-16 LAB — LIPID PANEL
Cholesterol: 135 mg/dL (ref 0–200)
HDL: 61.9 mg/dL (ref >39.00)
LDL Cholesterol: 58 mg/dL (ref 0–99)
NonHDL: 72.87
Total CHOL/HDL Ratio: 2
Triglycerides: 74 mg/dL (ref 0.0–149.0)
VLDL: 14.8 mg/dL (ref 0.0–40.0)

## 2022-09-16 LAB — COMPREHENSIVE METABOLIC PANEL
ALT: 17 U/L (ref 0–35)
AST: 20 U/L (ref 0–37)
Albumin: 4.2 g/dL (ref 3.5–5.2)
Alkaline Phosphatase: 67 U/L (ref 39–117)
BUN: 19 mg/dL (ref 6–23)
CO2: 28 mEq/L (ref 19–32)
Calcium: 8.9 mg/dL (ref 8.4–10.5)
Chloride: 104 mEq/L (ref 96–112)
Creatinine, Ser: 0.6 mg/dL (ref 0.40–1.20)
GFR: 101.7 mL/min (ref >60.00)
Glucose, Bld: 100 mg/dL — ABNORMAL HIGH (ref 70–99)
Potassium: 4.3 mEq/L (ref 3.5–5.1)
Sodium: 138 mEq/L (ref 135–145)
Total Bilirubin: 0.4 mg/dL (ref 0.2–1.2)
Total Protein: 7.3 g/dL (ref 6.0–8.3)

## 2022-09-16 LAB — HEMOGLOBIN A1C: Hgb A1c MFr Bld: 6.7 % — ABNORMAL HIGH (ref 4.6–6.5)

## 2022-09-16 MED ORDER — LISINOPRIL 20 MG PO TABS: 20.0000 mg | ORAL_TABLET | Freq: Every day | ORAL | 3 refills | Status: DC

## 2022-09-16 NOTE — Assessment & Plan Note (Signed)
Stable.  Diet and nutrition discussed. Continue atorvastatin 20 mg daily.

## 2022-09-16 NOTE — Progress Notes (Signed)
Autumn Haley 57 y.o.   Chief Complaint  Patient presents with   Annual Exam    No concerns , patient is not taking any DM medications     HISTORY OF PRESENT ILLNESS: This is a 57 y.o. female A1A here for annual exam. Overall doing well.  Has no complaints or medical concerns today. History of hypertension, dyslipidemia, and diabetes.  Takes lisinopril and atorvastatin.  Diet controlled diabetes. Needs colonoscopy referral. History of chronic venous insufficiency with bilateral lower extremity varices.  Last year developed right ankle ulcer which got infected.  Presently seen [place] [facility] specialists. Ulcer healing well.  Condition has improved.  HPI   Prior to Admission medications   Medication Sig Start Date End Date Taking? Authorizing Provider  Ascorbic Acid (VITAMIN C) 1000 MG tablet Take 1,000 mg by mouth daily.   Yes [provider]  atorvastatin (LIPITOR) 20 MG tablet Take 1 tablet by mouth once daily 08/14/22  Yes Erna Brossard, Ines Bloomer, MD  Calcium Carb-Cholecalciferol (CALCIUM 600+D3 PO) Take by mouth daily.   Yes [provider]  cetirizine (ZYRTEC) 10 MG tablet Take 1 tablet (10 mg total) by mouth at bedtime. 07/05/20  Yes Just, Laurita Quint, FNP  Cholecalciferol (VITAMIN D3 PO) Take 1,000 Units by mouth daily.    Yes [provider]  fluticasone (FLONASE) 50 MCG/ACT nasal spray Place 2 sprays into both nostrils daily. 07/05/20  Yes Just, Laurita Quint, FNP  meloxicam (MOBIC) 15 MG tablet Take daily for 2 weeks and then as needed. 11/26/21  Yes Senora Lacson, Ines Bloomer, MD  triamcinolone ointment (KENALOG) 0.1 % Apply topically. 10/29/21  Yes [provider]  lisinopril (ZESTRIL) 20 MG tablet Take 1 tablet (20 mg total) by mouth daily. 09/16/22   Horald Pollen, MD  metFORMIN (GLUCOPHAGE) 500 MG tablet Take 1 tablet (500 mg total) by mouth 2 (two) times daily with a meal. Patient not taking: Reported on 09/16/2022 08/25/21   Horald Pollen, MD    Allergies  Allergen Reactions   Levaquin [Levofloxacin] Rash    Patient Active Problem List   Diagnosis Date Noted   Dyslipidemia associated with type 2 diabetes mellitus ([place]) 11/26/2021   Chronic venous insufficiency 11/26/2021   Hypertension associated with diabetes ([place]) 08/22/2021   Dyslipidemia 08/22/2021   Chronic pain of left knee 03/17/2018   Primary osteoarthritis of left knee 03/17/2018   Osteoarthritis of fingers of hands, bilateral 06/17/2017   Arthralgia 02/23/2017   Vitamin D deficiency 02/11/2017   Fibroid uterus 12/26/2011    Past Medical History:  Diagnosis Date   Arthritis    Chronic kidney disease    right kidney   Fibroid    FIBROID UTERUS   NSVD (normal spontaneous vaginal delivery)    X 2   NSVD (normal spontaneous vaginal delivery)    X 2   SAB (spontaneous abortion)    SAB (spontaneous abortion)    Vitamin D deficiency     No past surgical history on file.  Social History   Socioeconomic History   Marital status: Married    Spouse name: Donnie Aho   Number of children: 2   Years of education: 12   Highest education level: Not on file  Occupational History   Occupation: Educational psychologist: [employer]    Comment: Housekeeping  Tobacco Use   Smoking status: Never   Smokeless tobacco: Never  Vaping Use   Vaping Use: Never used  Substance and Sexual Activity  Alcohol use: No    Comment: OCCASIONALLY   Drug use: No   Sexual activity: Not Currently    Partners: Male    Birth control/protection: Rhythm  Other Topics Concern   Not on file  Social History Narrative   ** Merged History Encounter **       From [place], [place].  Lives here with her husband and 2 children.   Social Determinants of Health   Financial Resource Strain: Not on file  Food Insecurity: Not on file  Transportation Needs: Not on file  Physical Activity: Not on file  Stress: Not on file  Social Connections: Not on file   Intimate Partner Violence: Not on file    No family history on file.   Review of Systems  Constitutional: Negative.  Negative for chills and fever.  HENT: Negative.  Negative for congestion and sore throat.   Respiratory: Negative.  Negative for cough and shortness of breath.   Cardiovascular: Negative.  Negative for chest pain and palpitations.  Gastrointestinal: Negative.  Negative for abdominal pain, diarrhea, nausea and vomiting.  Genitourinary: Negative.  Negative for dysuria and hematuria.  Skin: Negative.  Negative for rash.  Neurological: Negative.  Negative for dizziness and headaches.  All other systems reviewed and are negative.  Today's Vitals   09/16/22 1316  BP: 138/88  Pulse: (!) 57  Temp: 98.1 F (36.7 C)  TempSrc: Oral  SpO2: 96%  Weight: 142 lb 2 oz (64.5 kg)  Height: 4' 11.5" (1.511 m)   Body mass index is 28.23 kg/m.   Physical Exam Vitals reviewed.  Constitutional:      Appearance: Normal appearance.  HENT:     Head: Normocephalic.     Right Ear: Tympanic membrane, ear canal and external ear normal.     Left Ear: Tympanic membrane, ear canal and external ear normal.     Mouth/Throat:     Mouth: Mucous membranes are moist.     Pharynx: Oropharynx is clear.  Eyes:     Extraocular Movements: Extraocular movements intact.     Pupils: Pupils are equal, round, and reactive to light.  Cardiovascular:     Rate and Rhythm: Normal rate and regular rhythm.     Pulses: Normal pulses.     Heart sounds: Normal heart sounds.  Pulmonary:     Effort: Pulmonary effort is normal.     Breath sounds: Normal breath sounds.  Abdominal:     Palpations: Abdomen is soft.     Tenderness: There is no abdominal tenderness.  Musculoskeletal:        General: Normal range of motion.     Cervical back: No tenderness.     Right lower leg: No edema.     Left lower leg: No edema.     Comments: Lower extremities: Right ankle area: Well-healed ulcer medial aspect   Lymphadenopathy:     Cervical: No cervical adenopathy.  Skin:    General: Skin is warm and dry.  Neurological:     General: No focal deficit present.     Mental Status: She is alert and oriented to person, place, and time.  Psychiatric:        Mood and Affect: Mood normal.        Behavior: Behavior normal.      ASSESSMENT & PLAN: Problem List Items Addressed This Visit       Cardiovascular and Mediastinum   Hypertension associated with diabetes ([place])    Well-controlled hypertension. Continue lisinopril 20  mg daily. Diet controlled diabetes.  Not taking metformin. Hemoglobin A1c done today. Cardiovascular risks associated with hypertension and diabetes discussed. Diet and nutrition discussed.      Relevant Medications   lisinopril (ZESTRIL) 20 MG tablet   Other Relevant Orders   CBC with Differential   Comprehensive metabolic panel   Hemoglobin A1c   Chronic venous insufficiency    Stable.  Significantly improved. Sees [place] vein specialist on a regular basis.      Relevant Medications   lisinopril (ZESTRIL) 20 MG tablet     Endocrine   Dyslipidemia associated with type 2 diabetes mellitus ([place])    Stable.  Diet and nutrition discussed. Continue atorvastatin 20 mg daily.      Relevant Medications   lisinopril (ZESTRIL) 20 MG tablet   Other Relevant Orders   CBC with Differential   Hemoglobin A1c   Lipid panel   Other Visit Diagnoses     Routine general medical examination at a health care facility    -  Primary   Relevant Orders   CBC with Differential   Comprehensive metabolic panel   Hemoglobin A1c   Lipid panel   Colon cancer screening       Relevant Orders   Ambulatory referral to Gastroenterology   Essential hypertension       Relevant Medications   lisinopril (ZESTRIL) 20 MG tablet   Screening for deficiency anemia       Screening for lipoid disorders       Screening for endocrine, metabolic and immunity disorder          Modifiable  risk factors discussed with patient. Anticipatory guidance according to age provided. The following topics were also discussed: Social Determinants of Health Smoking.  Non-smoker Diet and nutrition.  Healthy diet Benefits of exercise stays physically active Cancer screening and need for colon cancer screening with colonoscopy Vaccinations reviewed and recommendations Cardiovascular risk assessment The 10-year ASCVD risk score (Arnett DK, et al., 2019) is: 4.9%   Values used to calculate the score:     Age: 80 years     Sex: Female     Is Non-Hispanic African American: No     Diabetic: Yes     Tobacco smoker: No     Systolic Blood Pressure: 294 mmHg     Is BP treated: Yes     HDL Cholesterol: 61.4 mg/dL     Total Cholesterol: 203 mg/dL Review of chronic medical conditions under management Review of all medications Mental health including depression and anxiety Fall and accident prevention  Patient Instructions  Mantenimiento de Technical sales engineer en [place] Maintenance, Female Adoptar un estilo de vida saludable y recibir atencin preventiva son importantes para promover la salud y Musician. Consulte al mdico sobre: El esquema adecuado para hacerse pruebas y exmenes peridicos. Cosas que puede hacer por su cuenta para prevenir enfermedades y [place] sano. Qu debo saber sobre la dieta, el peso y el ejercicio? Consuma una dieta saludable  Consuma una dieta que incluya muchas verduras, frutas, productos lcteos con bajo contenido de [place] y Advertising account planner. No consuma muchos alimentos ricos en grasas slidas, azcares agregados o sodio. Mantenga un peso saludable El ndice de masa muscular [facility]) se [place] para identificar problemas de [place]. Proporciona una estimacin de la grasa corporal basndose en el peso y la altura. Su mdico puede ayudarle a Radiation protection practitioner [place] y a Scientist, forensic o Theatre manager un peso saludable. Haga ejercicio con regularidad Haga ejercicio con regularidad.  Esta es una de las prcticas ms importantes que puede hacer por su salud. La [employer] de los adultos deben seguir estas pautas: Optometrist, al menos, 150 minutos de actividad fsica por semana. El ejercicio debe aumentar la frecuencia cardaca y Nature conservation officer transpirar (ejercicio de intensidad moderada). Hacer ejercicios de fortalecimiento por lo [employer] por semana. Agregue esto a su plan de ejercicio de intensidad moderada. Pase menos tiempo sentada. Incluso la actividad fsica ligera puede ser beneficiosa. Controle sus niveles de colesterol y lpidos en la sangre Comience a realizarse anlisis de lpidos y Research officer, trade union en la sangre a los 53 aos y luego reptalos cada 5 aos. Hgase controlar los niveles de colesterol con mayor frecuencia si: Sus niveles de lpidos y colesterol son altos. Es mayor de 40 aos. Presenta un alto riesgo de padecer enfermedades cardacas. Qu debo saber sobre las pruebas de deteccin del cncer? Segn su historia clnica y sus antecedentes familiares, es posible que deba realizarse pruebas de deteccin del cncer en diferentes edades. Esto puede incluir pruebas de deteccin de lo siguiente: Cncer de mama. Cncer de cuello uterino. Cncer colorrectal. Cncer de piel. Cncer de pulmn. Qu debo saber sobre la enfermedad cardaca, la diabetes y la hipertensin arterial? Presin arterial y enfermedad cardaca La hipertensin arterial causa enfermedades cardacas y [place] el riesgo de accidente cerebrovascular. Es ms probable que esto se manifieste en las personas que tienen lecturas de presin arterial alta o tienen sobrepeso. Hgase controlar la presin arterial: Cada 3 a 5 aos si tiene entre 18 y 67 aos. Todos los aos si es mayor de 40 aos. Diabetes Realcese exmenes de deteccin de la diabetes con regularidad. Este anlisis revisa el nivel de azcar en la sangre en [place]. Hgase las pruebas de deteccin: Cada tres aos despus de los 36 aos de edad si  tiene un peso normal y un bajo riesgo de padecer diabetes. Con ms frecuencia y a partir de [place] edad inferior si tiene sobrepeso o un alto riesgo de padecer diabetes. Qu debo saber sobre la prevencin de infecciones? Hepatitis B Si tiene un riesgo ms alto de contraer hepatitis B, debe someterse a un examen de deteccin de este virus. Hable con el mdico para averiguar si tiene riesgo de contraer la infeccin por hepatitis B. Hepatitis C Se recomienda el anlisis a: [employer] 1945 y 1965. Todas las personas que tengan un riesgo de haber contrado hepatitis C. Enfermedades de transmisin sexual (ETS) Hgase las pruebas de Programme researcher, broadcasting/film/video de ITS, incluidas la gonorrea y la clamidia, si: Es sexualmente activa y es menor de 51 aos. Es mayor de 54 aos, y Investment banker, operational informa que corre riesgo de tener este tipo de infecciones. La actividad sexual ha cambiado desde que le hicieron la ltima prueba de deteccin y tiene un riesgo mayor de Best boy clamidia o Radio broadcast assistant. Pregntele al mdico si usted tiene riesgo. Pregntele al mdico si usted tiene un alto riesgo de Museum/gallery curator VIH. El mdico tambin puede recomendarle un medicamento recetado para ayudar a evitar la infeccin por el VIH. Si elige tomar medicamentos para prevenir el VIH, primero debe [employer] de deteccin del VIH. Luego debe hacerse anlisis cada 3 meses mientras est tomando los medicamentos. Embarazo Si est por dejar de Librarian, academic (fase premenopusica) y usted puede quedar [place], busque asesoramiento antes de [place]. Tome de 400 a 800 microgramos (mcg) de cido [employer] si [place]. Pida mtodos de control de la natalidad (anticonceptivos) si desea evitar  un embarazo no deseado. Osteoporosis y [place] La osteoporosis es una enfermedad en la que los huesos pierden los minerales y la fuerza por el avance de la edad. El resultado pueden ser fracturas en los [place]. Si tiene 31  aos o ms, o si est en riesgo de sufrir osteoporosis y fracturas, pregunte a su mdico si debe: Hacerse pruebas de deteccin de prdida sea. Tomar un suplemento de calcio o de vitamina D para reducir el riesgo de fracturas. Recibir terapia de reemplazo hormonal (TRH) para tratar los sntomas de la menopausia. Siga estas indicaciones en su casa: Consumo de alcohol No beba alcohol si: Su mdico le indica no hacerlo. Est embarazada, puede estar embarazada o est tratando de [place]. Si bebe alcohol: Limite la cantidad que bebe a lo siguiente: De 0 a 1 bebida por da. Sepa cunta cantidad de alcohol hay en las bebidas que toma. En los [place], una medida equivale a una botella de cerveza de 12 oz (355 ml), un vaso de vino de 5 oz (148 ml) o un vaso de una bebida alcohlica de alta graduacin de 1 oz (44 ml). Estilo de vida No consuma ningn producto que contenga nicotina o tabaco. Estos productos incluyen cigarrillos, tabaco para Higher education careers adviser y aparatos de vapeo, como los Psychologist, sport and exercise. Si necesita ayuda para dejar de consumir estos productos, consulte al mdico. No consuma drogas. No comparta agujas. Solicite ayuda a su mdico si necesita apoyo o informacin para abandonar las drogas. Indicaciones generales Realcese los estudios de rutina de la salud, dentales y de Public librarian. [place]. Infrmele a su mdico si: Se siente deprimida con frecuencia. Alguna vez ha sido vctima de [place] o no se siente seguro en su casa. Resumen Adoptar un estilo de vida saludable y recibir atencin preventiva son importantes para promover la salud y Musician. Siga las instrucciones del mdico acerca de una dieta saludable, el ejercicio y la realizacin de pruebas o exmenes para Engineer, building services. Siga las instrucciones del mdico con respecto al control del colesterol y la presin arterial. Esta informacin no tiene Marine scientist el consejo del  mdico. Asegrese de hacerle al mdico cualquier pregunta que tenga. Document Revised: 01/17/2021 Document Reviewed: 01/17/2021 Elsevier Patient Education  [place], MD [facility]

## 2022-09-16 NOTE — Patient Instructions (Signed)

## 2022-09-16 NOTE — Assessment & Plan Note (Signed)
Stable.  Significantly improved. Sees [place] vein specialist on a regular basis.

## 2022-09-16 NOTE — Assessment & Plan Note (Signed)
Well-controlled hypertension. Continue lisinopril 20 mg daily. Diet controlled diabetes.  Not taking metformin. Hemoglobin A1c done today. Cardiovascular risks associated with hypertension and diabetes discussed. Diet and nutrition discussed.

## 2022-09-30 DIAGNOSIS — M7981 Nontraumatic hematoma of soft tissue: Secondary | ICD-10-CM | POA: Diagnosis not present

## 2022-09-30 DIAGNOSIS — I8311 Varicose veins of right lower extremity with inflammation: Secondary | ICD-10-CM | POA: Diagnosis not present

## 2022-10-14 DIAGNOSIS — I10 Essential (primary) hypertension: Secondary | ICD-10-CM | POA: Diagnosis not present

## 2022-10-14 DIAGNOSIS — Z1211 Encounter for screening for malignant neoplasm of colon: Secondary | ICD-10-CM | POA: Diagnosis not present

## 2022-11-03 DIAGNOSIS — K635 Polyp of colon: Secondary | ICD-10-CM | POA: Diagnosis not present

## 2022-11-03 DIAGNOSIS — D125 Benign neoplasm of sigmoid colon: Secondary | ICD-10-CM | POA: Diagnosis not present

## 2022-11-03 DIAGNOSIS — Z1211 Encounter for screening for malignant neoplasm of colon: Secondary | ICD-10-CM | POA: Diagnosis not present

## 2022-11-03 LAB — HM COLONOSCOPY

## 2022-11-11 ENCOUNTER — Other Ambulatory Visit: Payer: Self-pay | Admitting: Emergency Medicine

## 2022-11-11 DIAGNOSIS — E785 Hyperlipidemia, unspecified: Secondary | ICD-10-CM

## 2022-12-11 ENCOUNTER — Other Ambulatory Visit: Payer: Self-pay | Admitting: Emergency Medicine

## 2022-12-11 DIAGNOSIS — Z1231 Encounter for screening mammogram for malignant neoplasm of breast: Secondary | ICD-10-CM

## 2022-12-16 ENCOUNTER — Ambulatory Visit (INDEPENDENT_AMBULATORY_CARE_PROVIDER_SITE_OTHER): Payer: BC Managed Care – PPO | Admitting: Emergency Medicine

## 2022-12-16 ENCOUNTER — Ambulatory Visit
Admission: RE | Admit: 2022-12-16 | Discharge: 2022-12-16 | Disposition: A | Discharge status: Home / Self Care | Payer: BC Managed Care – PPO | Source: Ambulatory Visit | Attending: Emergency Medicine | Admitting: Emergency Medicine

## 2022-12-16 ENCOUNTER — Encounter: Payer: Self-pay | Admitting: Emergency Medicine

## 2022-12-16 ENCOUNTER — Ambulatory Visit (INDEPENDENT_AMBULATORY_CARE_PROVIDER_SITE_OTHER): Payer: BC Managed Care – PPO

## 2022-12-16 VITALS — BP 130/82 | HR 70 | Temp 98.2°F | Ht 59.5 in | Wt 136.0 lb

## 2022-12-16 DIAGNOSIS — F4323 Adjustment disorder with mixed anxiety and depressed mood: Secondary | ICD-10-CM | POA: Insufficient documentation

## 2022-12-16 DIAGNOSIS — R079 Chest pain, unspecified: Secondary | ICD-10-CM | POA: Insufficient documentation

## 2022-12-16 DIAGNOSIS — I152 Hypertension secondary to endocrine disorders: Secondary | ICD-10-CM

## 2022-12-16 DIAGNOSIS — Z1231 Encounter for screening mammogram for malignant neoplasm of breast: Secondary | ICD-10-CM

## 2022-12-16 DIAGNOSIS — I700 Atherosclerosis of aorta: Secondary | ICD-10-CM

## 2022-12-16 DIAGNOSIS — E1159 Type 2 diabetes mellitus with other circulatory complications: Secondary | ICD-10-CM | POA: Diagnosis not present

## 2022-12-16 DIAGNOSIS — E1169 Type 2 diabetes mellitus with other specified complication: Secondary | ICD-10-CM | POA: Diagnosis not present

## 2022-12-16 DIAGNOSIS — E785 Hyperlipidemia, unspecified: Secondary | ICD-10-CM

## 2022-12-16 DIAGNOSIS — K2100 Gastro-esophageal reflux disease with esophagitis, without bleeding: Secondary | ICD-10-CM

## 2022-12-16 MED ORDER — DULOXETINE HCL 30 MG PO CPEP
30.0000 mg | ORAL_CAPSULE | Freq: Every day | ORAL | 3 refills | Status: AC
Start: 2022-12-16 — End: ?

## 2022-12-16 NOTE — Assessment & Plan Note (Signed)
Clinically stable.  No red flag signs or symptoms Normal vital signs Normal EKG.  Unremarkable physical exam Differential diagnosis discussed Chest x-ray done today.  Report reviewed. Most likely secondary to GERD May need cardiology evaluation in the near future.

## 2022-12-16 NOTE — Progress Notes (Signed)
Autumn Haley 57 y.o.   Chief Complaint  Patient presents with   throat pain     Side of throat, comes and goes , x 2 weeks     HISTORY OF PRESENT ILLNESS: This is a 57 y.o. female complaining of occasional right sided throat pain for the past [redacted] weeks along with midsternal burning sensation and occasional sore throat. Symptoms worse at night when she has to get up burping Also dealing with increased emotional stress due to sisters recent diagnosis of terminal breast cancer No other complaints or medical concerns today. Lab Results  Component Value Date   HGBA1C 6.7 (H) 09/16/2022   BP Readings from Last 3 Encounters:  12/16/22 130/82  09/16/22 138/88  11/26/21 130/70   Wt Readings from Last 3 Encounters:  12/16/22 136 lb (61.7 kg)  09/16/22 142 lb 2 oz (64.5 kg)  11/26/21 144 lb 6 oz (65.5 kg)     HPI   Prior to Admission medications   Medication Sig Start Date End Date Taking? Authorizing Provider  atorvastatin (LIPITOR) 20 MG tablet Take 1 tablet by mouth once daily 11/11/22  Yes Jex Strausbaugh, [place], MD  lisinopril (ZESTRIL) 20 MG tablet Take 1 tablet (20 mg total) by mouth daily. 09/16/22  Yes Caroline Matters, Eilleen Kempf, MD  Ascorbic Acid (VITAMIN C) 1000 MG tablet Take 1,000 mg by mouth daily. Patient not taking: Reported on 12/16/2022    [provider]  Calcium Carb-Cholecalciferol (CALCIUM 600+D3 PO) Take by mouth daily. Patient not taking: Reported on 12/16/2022    [provider]  cetirizine (ZYRTEC) 10 MG tablet Take 1 tablet (10 mg total) by mouth at bedtime. Patient not taking: Reported on 12/16/2022 07/05/20   Just, Azalee Course, FNP  Cholecalciferol (VITAMIN D3 PO) Take 1,000 Units by mouth daily.  Patient not taking: Reported on 12/16/2022    [provider]  fluticasone (FLONASE) 50 MCG/ACT nasal spray Place 2 sprays into both nostrils daily. Patient not taking: Reported on 12/16/2022 07/05/20   Just, Azalee Course, FNP  meloxicam (MOBIC)  15 MG tablet Take daily for 2 weeks and then as needed. Patient not taking: Reported on 12/16/2022 11/26/21   Georgina Quint, MD  metFORMIN (GLUCOPHAGE) 500 MG tablet Take 1 tablet (500 mg total) by mouth 2 (two) times daily with a meal. Patient not taking: Reported on 09/16/2022 08/25/21   Georgina Quint, MD  triamcinolone ointment (KENALOG) 0.1 % Apply topically. Patient not taking: Reported on 12/16/2022 10/29/21   [provider]    Allergies  Allergen Reactions   Levaquin [Levofloxacin] Rash    Patient Active Problem List   Diagnosis Date Noted   Dyslipidemia associated with type 2 diabetes mellitus 11/26/2021   Chronic venous insufficiency 11/26/2021   Hypertension associated with diabetes 08/22/2021   Dyslipidemia 08/22/2021   Chronic pain of left knee 03/17/2018   Primary osteoarthritis of left knee 03/17/2018   Osteoarthritis of fingers of hands, bilateral 06/17/2017   Arthralgia 02/23/2017   Vitamin D deficiency 02/11/2017   Fibroid uterus 12/26/2011    Past Medical History:  Diagnosis Date   Arthritis    Chronic kidney disease    right kidney   Fibroid    FIBROID UTERUS   NSVD (normal spontaneous vaginal delivery)    X 2   NSVD (normal spontaneous vaginal delivery)    X 2   SAB (spontaneous abortion)    SAB (spontaneous abortion)    Vitamin D deficiency     No  past surgical history on file.  Social History   Socioeconomic History   Marital status: Married    Spouse name: Emeline Gins   Number of children: 2   Years of education: 12   Highest education level: Not on file  Occupational History   Occupation: Music therapist: [employer]    Comment: Housekeeping  Tobacco Use   Smoking status: Never   Smokeless tobacco: Never  Vaping Use   Vaping Use: Never used  Substance and Sexual Activity   Alcohol use: No    Comment: OCCASIONALLY   Drug use: No   Sexual activity: Not Currently    Partners: Male    Birth  control/protection: Rhythm  Other Topics Concern   Not on file  Social History Narrative   ** Merged History Encounter **       From [place], [place].  Lives here with her husband and 2 children.   Social Determinants of Health   Financial Resource Strain: Not on file  Food Insecurity: Not on file  Transportation Needs: Not on file  Physical Activity: Not on file  Stress: Not on file  Social Connections: Not on file  Intimate Partner Violence: Not on file    No family history on file.   Review of Systems  Constitutional: Negative.  Negative for chills and fever.  HENT:  Positive for sore throat. Negative for congestion.   Respiratory: Negative.  Negative for cough and shortness of breath.   Cardiovascular:  Positive for chest pain. Negative for palpitations and claudication.  Gastrointestinal:  Negative for abdominal pain, diarrhea, nausea and vomiting.  Genitourinary: Negative.  Negative for dysuria and hematuria.  Skin: Negative.  Negative for rash.  Neurological: Negative.  Negative for dizziness and headaches.  All other systems reviewed and are negative.   Vitals:   12/16/22 1049  BP: 130/82  Pulse: 70  Temp: 98.2 F (36.8 C)  SpO2: 95%    Physical Exam Vitals reviewed.  Constitutional:      Appearance: Normal appearance.  HENT:     Head: Normocephalic.     Mouth/Throat:     Mouth: Mucous membranes are moist.     Pharynx: Oropharynx is clear.  Eyes:     Extraocular Movements: Extraocular movements intact.     Conjunctiva/sclera: Conjunctivae normal.     Pupils: Pupils are equal, round, and reactive to light.  Cardiovascular:     Rate and Rhythm: Normal rate and regular rhythm.     Pulses: Normal pulses.     Heart sounds: Normal heart sounds.  Pulmonary:     Effort: Pulmonary effort is normal.     Breath sounds: Normal breath sounds.  Abdominal:     Palpations: Abdomen is soft.     Tenderness: There is no abdominal tenderness.  Musculoskeletal:      Cervical back: No tenderness.  Lymphadenopathy:     Cervical: No cervical adenopathy.  Skin:    General: Skin is warm and dry.     Capillary Refill: Capillary refill takes less than 2 seconds.  Neurological:     General: No focal deficit present.     Mental Status: She is alert and oriented to person, place, and time.  Psychiatric:        Mood and Affect: Mood normal.        Behavior: Behavior normal.   DG Chest 2 View  Result Date: 12/16/2022 CLINICAL DATA:  Provided history: Chest pain. EXAM: CHEST - 2 VIEW COMPARISON:  No pertinent prior exams available for comparison. FINDINGS: Heart size within normal limits. Aortic atherosclerosis. No appreciable airspace consolidation. No evidence of pleural effusion or pneumothorax. No acute osseous abnormality identified. IMPRESSION: 1.  No evidence of acute cardiopulmonary abnormality. 2.  Aortic Atherosclerosis (ICD10-I70.0). Electronically Signed   By: Jackey Loge D.O.   On: 12/16/2022 11:43     EKG: Normal sinus rhythm with ventricular rate of 69.  No acute ischemic changes.  Normal EKG. ASSESSMENT & PLAN: A total of 46 minutes was spent with the patient and counseling/coordination of care regarding preparing for this visit, review of most recent office visit notes, review of most recent blood work results, review of chest x-ray report done today, differential diagnosis of chest pain and possibility of GERD, review of all medications and changes made, prognosis, education on nutrition, documentation and need for follow-up.  Problem List Items Addressed This Visit       Cardiovascular and Mediastinum   Hypertension associated with diabetes    Well-controlled hypertension. Continue lisinopril 20 mg daily Well-controlled diabetes Continue metformin 500 mg twice a day Cardiovascular risks associated with hypertension and diabetes discussed Dietary approaches to stop hypertension discussed      Aortic atherosclerosis    Diet and  nutrition discussed Continue atorvastatin 20 mg daily        Digestive   Gastroesophageal reflux disease with esophagitis without hemorrhage    Recommend to start pantoprazole 40 mg daily for 2 to 3 weeks Diet and nutrition discussed.  Foods to avoid discussed. May need upper endoscopy if unresponsive to PPI treatment        Endocrine   Dyslipidemia associated with type 2 diabetes mellitus    Chronic stable condition. Continues atorvastatin 20 mg daily The 10-year ASCVD risk score (Arnett DK, et al., 2019) is: 2.8%   Values used to calculate the score:     Age: 85 years     Sex: Female     Is Non-Hispanic African American: No     Diabetic: Yes     Tobacco smoker: No     Systolic Blood Pressure: 130 mmHg     Is BP treated: Yes     HDL Cholesterol: 61.9 mg/dL     Total Cholesterol: 135 mg/dL         Other   Situational mixed anxiety and depressive disorder    Provoked by sister's recent diagnosis of terminal breast cancer Sister is presently in [place] Crying spells affecting quality of life Recommend to start Cymbalta 30 mg daily Stress management discussed      Relevant Medications   DULoxetine (CYMBALTA) 30 MG capsule   Nonspecific chest pain - Primary    Clinically stable.  No red flag signs or symptoms Normal vital signs Normal EKG.  Unremarkable physical exam Differential diagnosis discussed Chest x-ray done today.  Report reviewed. Most likely secondary to GERD May need cardiology evaluation in the near future.      Relevant Orders   DG Chest 2 View (Completed)     Patient Instructions  Enfermedad de reflujo gastroesofgico en los adultos Gastroesophageal Reflux Disease, Adult  El reflujo gastroesofgico (RGE) ocurre cuando el cido del estmago sube por el tubo que conecta la boca con el estmago (esfago). Normalmente, la comida baja por el esfago y se mantiene en el [address], donde se la digiere. Cuando una persona tiene RGE, los alimentos y el  cido estomacal suelen volver al esfago. Usted puede tener una enfermedad llamada enfermedad de  reflujo gastroesofgico (ERGE) si el reflujo: Sucede a menudo. Causa sntomas frecuentes o muy intensos. Causa problemas tales como dao en el esfago. Cuando esto ocurre, el esfago duele y se hincha. Con el tiempo, la ERGE puede ocasionar pequeos agujeros (lceras) en el revestimiento del esfago. Cules son las causas? Esta afeccin se debe a un problema en el msculo que se encuentra entre el esfago y [place]. Cuando este msculo est dbil o no es normal, no se cierra correctamente para impedir que los alimentos y el cido regresen del Teaching laboratory technician. El msculo puede debilitarse debido a lo siguiente: El consumo de [place]. [place]. Tener cierto tipo de hernia (hernia de hiato). Consumo de alcohol. Ciertos alimentos y bebidas, como caf, chocolate, cebollas y [place]. Qu incrementa el riesgo? Tener sobrepeso. Tener una enfermedad que afecta el tejido conjuntivo. Tomar antiinflamatorios no esteroideos (AINE), como el ibuprofeno. Cules son los signos o sntomas? Acidez estomacal. Dificultad o dolor al tragar. Sensacin de Warehouse manager un bulto en la garganta. Sabor amargo en la boca. Mal aliento. Tener una gran cantidad de saliva. Estmago inflamado o con Dentist. Eructos. Dolor en el pecho. El dolor de pecho puede deberse a distintas afecciones. Asegrese de Science writer a su mdico si tiene Journalist, newspaper. Dificultad para respirar o sibilancias. Neomia Dear tos a largo plazo o tos nocturna. Desgaste de la superficie de los dientes (esmalte dental). Prdida de peso. Cmo se trata? Realizar cambios en la dieta. Tomar medicamentos. Someterse a [place]. El tratamiento depender de la gravedad de los sntomas. Siga estas instrucciones en su casa: Comida y bebida  Siga una dieta como se lo haya indicado el mdico. Es posible que deba evitar alimentos y bebidas, por ejemplo: Caf y t  negro, con o sin cafena. Bebidas que contengan alcohol. Bebidas energticas y deportivas. Bebidas gaseosas (carbonatadas) y refrescos. Chocolate y cacao. Menta y esencia de [place]. Ajo y cebolla. Rbano picante. Alimentos cidos y condimentados. Estos incluyen todos los tipos de pimientos, [place] en polvo, curry en polvo, vinagre, salsas picantes y [employer]. Ctricos y sus jugos, por ejemplo, naranjas, limones y limas. Alimentos que [employer]. Estos incluyen salsa roja, [place], salsa picante y pizza con salsa de [place]. Alimentos fritos y Lexicographer. Estos incluyen donas, papas fritas, papitas fritas de bolsa y aderezos con alto contenido de [place] Carnes con alto contenido de [place] Estas incluye los perros calientes, chuletas o costillas, embutidos, jamn y tocino. Productos lcteos ricos en grasas, como leche [place], [place] y [place] crema. Consuma pequeas cantidades de comida con ms frecuencia. Evite consumir porciones abundantes. Evite beber grandes cantidades de lquidos con las comidas. Evite comer 2 o 3 horas antes de acostarse. Evite recostarse inmediatamente despus de comer. No haga ejercicios enseguida despus de comer. Estilo de vida  No fume ni consuma ningn producto que contenga nicotina o tabaco. Si necesita ayuda para dejar de consumir estos productos, consulte al mdico. Intente reducir el nivel de estrs. Si necesita ayuda para hacer esto, consulte al mdico. Si tiene sobrepeso, baje una cantidad de peso saludable para usted. Consulte a su mdico para bajar de peso de [employer]. Instrucciones generales Est atento a cualquier cambio en los sntomas. Tome los medicamentos de venta libre y los recetados solamente como se lo haya indicado el mdico. No tome aspirina, ibuprofeno ni otros antiinflamatorios no esteroideos (AINE) a menos que el mdico lo autorice. Use ropa holgada. No use nada apretado alrededor de la cintura. Levante (eleve) la cabecera de la  cama aproximadamente 6  pulgadas (15 cm). Para hacerlo, es posible que tenga que utilizar una cua. Evite inclinarse si al hacerlo empeoran los sntomas. Cumpla con todas las visitas de seguimiento. Comunquese con un mdico si: Aparecen nuevos sntomas. Adelgaza y no sabe por qu. Tiene problemas para tragar o le duele cuando traga. Tiene sibilancias o tos persistente. Tiene la voz ronca. Los sntomas no mejoran con Scientist, research (medical). Solicite ayuda de inmediato si: Electronics engineer repentino [employer], el cuello, la [place], los dientes o la espalda. De repente se siente transpirado, mareado o aturdido. Siente falta de aire o Journalist, newspaper. Vomita y el vmito es de color verde, amarillo o negro, o tiene un aspecto similar a la sangre o a los posos de caf. Se desmaya. Las heces (deposiciones) son rojas, sanguinolentas o negras. No puede tragar, beber o comer. Estos sntomas pueden representar un problema grave que constituye Radio broadcast assistant. No espere a ver si los sntomas desaparecen. Solicite atencin mdica de inmediato. Comunquese con el servicio de emergencias de su localidad (911 en los [place]). No conduzca por sus propios medios [employer]. Resumen Si una persona tiene enfermedad de reflujo gastroesofgico (ERGE), los alimentos y el cido estomacal suben al esfago y causan sntomas o problemas tales como dao en el esfago. El tratamiento depender de la gravedad de los sntomas. Siga una dieta como se lo haya indicado el mdico. Use todos los medicamentos solamente como se lo haya indicado el mdico. Esta informacin no tiene Theme park manager el consejo del mdico. Asegrese de hacerle al mdico cualquier pregunta que tenga. Document Revised: 03/23/2020 Document Reviewed: 03/23/2020 Elsevier Patient Education  2023 Elsevier Inc.    Edwina Barth, MD [facility]

## 2022-12-16 NOTE — Assessment & Plan Note (Signed)
Chronic stable condition. Continues atorvastatin 20 mg daily The 10-year ASCVD risk score (Arnett DK, et al., 2019) is: 2.8%   Values used to calculate the score:     Age: 57 years     Sex: Female     Is Non-Hispanic African American: No     Diabetic: Yes     Tobacco smoker: No     Systolic Blood Pressure: 130 mmHg     Is BP treated: Yes     HDL Cholesterol: 61.9 mg/dL     Total Cholesterol: 135 mg/dL

## 2022-12-16 NOTE — Patient Instructions (Signed)
Enfermedad de reflujo gastroesof?gico en los adultos ?Gastroesophageal Reflux Disease, Adult ? ?El reflujo gastroesof?gico (RGE) ocurre cuando el ?cido del est?mago sube por el tubo que conecta la boca con el est?mago (es?fago). Normalmente, la comida baja por el es?fago y se mantiene en el est?mago, donde se la digiere. Cuando una persona tiene RGE, los alimentos y el ?cido estomacal suelen volver al es?fago. ?Usted puede tener una enfermedad llamada enfermedad de reflujo gastroesof?gico (ERGE) si el reflujo: ?Sucede a menudo. ?Causa s?ntomas frecuentes o muy intensos. ?Causa problemas tales como da?o en el es?fago. ?Cuando esto ocurre, el es?fago duele y se hincha. Con el tiempo, la ERGE puede ocasionar peque?os agujeros (?lceras) en el revestimiento del es?fago. ??Cu?les son las causas? ?Esta afecci?n se debe a un problema en el m?sculo que se encuentra entre el es?fago y el est?mago. Cuando este m?sculo est? d?bil o no es normal, no se cierra correctamente para impedir que los alimentos y el ?cido regresen del est?mago. ?El m?sculo puede debilitarse debido a lo siguiente: ?El consumo de tabaco. ?Embarazo. ?Tener cierto tipo de hernia (hernia de hiato). ?Consumo de alcohol. ?Ciertos alimentos y bebidas, como caf?, chocolate, cebollas y menta. ??Qu? incrementa el riesgo? ?Tener sobrepeso. ?Tener una enfermedad que afecta el tejido conjuntivo. ?Tomar antiinflamatorios no esteroideos (AINE), como el ibuprofeno. ??Cu?les son los signos o s?ntomas? ?Acidez estomacal. ?Dificultad o dolor al tragar. ?Sensaci?n de tener un bulto en la garganta. ?Sabor amargo en la boca. ?Mal aliento. ?Tener una gran cantidad de saliva. ?Est?mago inflamado o con malestar. ?Eructos. ?Dolor en el pecho. El dolor de pecho puede deberse a distintas afecciones. Aseg?rese de consultar a su m?dico si tiene dolor en el pecho. ?Dificultad para respirar o sibilancias. ?Una tos a largo plazo o tos nocturna. ?Desgaste de la superficie de los dientes  (esmalte dental). ?P?rdida de peso. ??C?mo se trata? ?Realizar cambios en la dieta. ?Tomar medicamentos. ?Someterse a una cirug?a. ?El tratamiento depender? de la gravedad de los s?ntomas. ?Siga estas instrucciones en su casa: ?Comida y bebida ? ?Siga una dieta como se lo haya indicado el m?dico. Es posible que deba evitar alimentos y bebidas, por ejemplo: ?Caf? y t? negro, con o sin cafe?na. ?Bebidas que contengan alcohol. ?Bebidas energ?ticas y deportivas. ?Bebidas gaseosas (carbonatadas) y refrescos. ?Chocolate y cacao. ?Menta y esencia de menta. ?Ajo y cebolla. ?R?bano picante. ?Alimentos ?cidos y condimentados. Estos incluyen todos los tipos de pimientos, chile en polvo, curry en polvo, vinagre, salsas picantes y salsa barbacoa. ?C?tricos y sus jugos, por ejemplo, naranjas, limones y limas. ?Alimentos que contengan tomate. Estos incluyen salsa roja, chile, salsa picante y pizza con salsa de tomate. ?Alimentos fritos y grasos. Estos incluyen donas, papas fritas, papitas fritas de bolsa y aderezos con alto contenido de grasa. ?Carnes con alto contenido de grasa. Estas incluye los perros calientes, chuletas o costillas, embutidos, jam?n y tocino. ?Productos l?cteos ricos en grasas, como leche entera, manteca y queso crema. ?Consuma peque?as cantidades de comida con m?s frecuencia. Evite consumir porciones abundantes. ?Evite beber grandes cantidades de l?quidos con las comidas. ?Evite comer 2 o 3 horas antes de acostarse. ?Evite recostarse inmediatamente despu?s de comer. ?No haga ejercicios enseguida despu?s de comer. ?Estilo de vida ? ?No fume ni consuma ning?n producto que contenga nicotina o tabaco. Si necesita ayuda para dejar de consumir estos productos, consulte al m?dico. ?Intente reducir el nivel de estr?s. Si necesita ayuda para hacer esto, consulte al m?dico. ?Si tiene sobrepeso, baje una cantidad de peso saludable para usted. Consulte a   su m?dico para bajar de peso de manera segura. ?Instrucciones  generales ?Est? atento a cualquier cambio en los s?ntomas. ?Tome los medicamentos de venta libre y los recetados solamente como se lo haya indicado el m?dico. ?No tome aspirina, ibuprofeno ni otros antiinflamatorios no esteroideos (AINE) a menos que el m?dico lo autorice. ?Use ropa holgada. No use nada apretado alrededor de la cintura. ?Levante (eleve) la cabecera de la cama aproximadamente 6 pulgadas (15 cm). Para hacerlo, es posible que tenga que utilizar una cu?a. ?Evite inclinarse si al hacerlo empeoran los s?ntomas. ?Cumpla con todas las visitas de seguimiento. ?Comun?quese con un m?dico si: ?Aparecen nuevos s?ntomas. ?Adelgaza y no sabe por qu?. ?Tiene problemas para tragar o le duele cuando traga. ?Tiene sibilancias o tos persistente. ?Tiene la voz ronca. ?Los s?ntomas no mejoran con el tratamiento. ?Solicite ayuda de inmediato si: ?Siente dolor repentino en los brazos, el cuello, la mand?bula, los dientes o la espalda. ?De repente se siente transpirado, mareado o aturdido. ?Siente falta de aire o dolor en el pecho. ?Vomita y el v?mito es de color verde, amarillo o negro, o tiene un aspecto similar a la sangre o a los posos de caf?. ?Se desmaya. ?Las heces (deposiciones) son rojas, sanguinolentas o negras. ?No puede tragar, beber o comer. ?Estos s?ntomas pueden representar un problema grave que constituye una emergencia. No espere a ver si los s?ntomas desaparecen. Solicite atenci?n m?dica de inmediato. Comun?quese con el servicio de emergencias de su localidad (911 en los [place]). No conduzca por sus propios medios hasta el hospital. ?Resumen ?Si una persona tiene enfermedad de reflujo gastroesof?gico (ERGE), los alimentos y el ?cido estomacal suben al es?fago y causan s?ntomas o problemas tales como da?o en el es?fago. ?El tratamiento depender? de la gravedad de los s?ntomas. ?Siga una dieta como se lo haya indicado el m?dico. ?Use todos los medicamentos solamente como se lo haya indicado el  m?dico. ?Esta informaci?n no tiene como fin reemplazar el consejo del m?dico. Aseg?rese de hacerle al m?dico cualquier pregunta que tenga. ?Document Revised: 03/23/2020 Document Reviewed: 03/23/2020 ?Elsevier Patient Education ? 2023 Elsevier Inc. ? ?

## 2022-12-16 NOTE — Assessment & Plan Note (Signed)
Provoked by sister's recent diagnosis of terminal breast cancer Sister is presently in [place] Crying spells affecting quality of life Recommend to start Cymbalta 30 mg daily Stress management discussed

## 2022-12-16 NOTE — Assessment & Plan Note (Signed)
Well-controlled hypertension. Continue lisinopril 20 mg daily Well-controlled diabetes Continue metformin 500 mg twice a day Cardiovascular risks associated with hypertension and diabetes discussed Dietary approaches to stop hypertension discussed

## 2022-12-16 NOTE — Assessment & Plan Note (Signed)
Diet and nutrition discussed.  Continue atorvastatin 20 mg daily. 

## 2022-12-16 NOTE — Assessment & Plan Note (Signed)
Recommend to start pantoprazole 40 mg daily for 2 to 3 weeks Diet and nutrition discussed.  Foods to avoid discussed. May need upper endoscopy if unresponsive to PPI treatment

## 2022-12-18 ENCOUNTER — Other Ambulatory Visit: Payer: Self-pay | Admitting: Emergency Medicine

## 2022-12-18 DIAGNOSIS — R928 Other abnormal and inconclusive findings on diagnostic imaging of breast: Secondary | ICD-10-CM

## 2022-12-22 NOTE — Progress Notes (Signed)
Thank you :)

## 2022-12-29 NOTE — Addendum Note (Signed)
Addended by: Jerrell Belfast on: 12/29/2022 04:17 PM   Modules accepted: Orders

## 2022-12-30 ENCOUNTER — Ambulatory Visit: Payer: BC Managed Care – PPO

## 2022-12-30 ENCOUNTER — Ambulatory Visit
Admission: RE | Admit: 2022-12-30 | Discharge: 2022-12-30 | Disposition: A | Discharge status: Home / Self Care | Payer: BC Managed Care – PPO | Source: Ambulatory Visit | Attending: Emergency Medicine | Admitting: Emergency Medicine

## 2022-12-30 DIAGNOSIS — R928 Other abnormal and inconclusive findings on diagnostic imaging of breast: Secondary | ICD-10-CM

## 2022-12-30 DIAGNOSIS — M7989 Other specified soft tissue disorders: Secondary | ICD-10-CM | POA: Diagnosis not present

## 2022-12-30 DIAGNOSIS — I87393 Chronic venous hypertension (idiopathic) with other complications of bilateral lower extremity: Secondary | ICD-10-CM | POA: Diagnosis not present

## 2022-12-30 DIAGNOSIS — I8312 Varicose veins of left lower extremity with inflammation: Secondary | ICD-10-CM | POA: Diagnosis not present

## 2022-12-30 DIAGNOSIS — R922 Inconclusive mammogram: Secondary | ICD-10-CM | POA: Diagnosis not present

## 2022-12-30 DIAGNOSIS — L299 Pruritus, unspecified: Secondary | ICD-10-CM | POA: Diagnosis not present

## 2022-12-30 DIAGNOSIS — R600 Localized edema: Secondary | ICD-10-CM | POA: Diagnosis not present

## 2022-12-30 DIAGNOSIS — I8311 Varicose veins of right lower extremity with inflammation: Secondary | ICD-10-CM | POA: Diagnosis not present

## 2023-02-17 ENCOUNTER — Other Ambulatory Visit (HOSPITAL_COMMUNITY)
Admission: RE | Admit: 2023-02-17 | Discharge: 2023-02-17 | Disposition: A | Discharge status: Home / Self Care | Payer: BC Managed Care – PPO | Source: Ambulatory Visit | Attending: Obstetrics & Gynecology | Admitting: Obstetrics & Gynecology

## 2023-02-17 ENCOUNTER — Ambulatory Visit (INDEPENDENT_AMBULATORY_CARE_PROVIDER_SITE_OTHER): Payer: BC Managed Care – PPO | Admitting: Obstetrics & Gynecology

## 2023-02-17 ENCOUNTER — Encounter: Payer: Self-pay | Admitting: Obstetrics & Gynecology

## 2023-02-17 VITALS — BP 130/86 | HR 69 | Ht 59.5 in | Wt 134.0 lb

## 2023-02-17 DIAGNOSIS — Z01419 Encounter for gynecological examination (general) (routine) without abnormal findings: Secondary | ICD-10-CM

## 2023-02-17 DIAGNOSIS — Z780 Asymptomatic menopausal state: Secondary | ICD-10-CM

## 2023-02-17 NOTE — Progress Notes (Signed)
Autumn Haley [birth year] [id number]   History:    57 y.o. G3P2A1L2 Separated, husband back in [place].  Son is 28 yo.  Daughter is 12 yo, Dx of skin cancer.   RP:  Established patient presenting for annual gyn exam    HPI: Postmenopause since age 27 yo, well on no HRT.  No postmenopausal bleeding.  No pelvic pain.  Currently abstinent. Pap Neg in 07/2019. Pap reflex today. Urine and bowel movements normal. Breasts normal. Mammo Rt Neg 54/2024, Lt Dx Mammo Neg 12/2022.  Body mass index 26.61.  Health labs with Fam MD.  Alen Bleacher 10/2022.   Past medical history,surgical history, family history and social history were all reviewed and documented in the EPIC chart.  Gynecologic History Patient's last menstrual period was 02/19/2017.  Obstetric History OB History  Gravida Para Term Preterm AB Living  3 2 2  0 1 2  SAB IAB Ectopic Multiple Live Births  1 0 0   2    # Outcome Date GA Lbr Len/2nd Weight Sex Delivery Anes PTL Lv  3 SAB           2 Term     F Vag-Spont  N LIV  1 Term     M Vag-Spont  N LIV     ROS: A ROS was performed and pertinent positives and negatives are included in the history. GENERAL: No fevers or chills. HEENT: No change in vision, no earache, sore throat or sinus congestion. NECK: No pain or stiffness. CARDIOVASCULAR: No chest pain or pressure. No palpitations. PULMONARY: No shortness of breath, cough or wheeze. GASTROINTESTINAL: No abdominal pain, nausea, vomiting or diarrhea, melena or bright red blood per rectum. GENITOURINARY: No urinary frequency, urgency, hesitancy or dysuria. MUSCULOSKELETAL: No joint or muscle pain, no back pain, no recent trauma. DERMATOLOGIC: No rash, no itching, no lesions. ENDOCRINE: No polyuria, polydipsia, no heat or cold intolerance. No recent change in weight. HEMATOLOGICAL: No anemia or easy bruising or bleeding. NEUROLOGIC: No headache, seizures, numbness, tingling or weakness. PSYCHIATRIC: No depression, no loss of interest in normal  activity or change in sleep pattern.     Exam:   BP 130/86 (BP Location: Left Arm, Patient Position: Sitting, Cuff Size: Normal)   Pulse 69   Ht 4' 11.5" (1.511 m)   Wt 134 lb (60.8 kg)   LMP 02/19/2017   SpO2 94%   BMI 26.61 kg/m   Body mass index is 26.61 kg/m.  General appearance : Well developed well nourished female. No acute distress HEENT: Eyes: no retinal hemorrhage or exudates,  Neck supple, trachea midline, no carotid bruits, no thyroidmegaly Lungs: Clear to auscultation, no rhonchi or wheezes, or rib retractions  Heart: Regular rate and rhythm, no murmurs or gallops Breast:Examined in sitting and supine position were symmetrical in appearance, no palpable masses or tenderness,  no skin retraction, no nipple inversion, no nipple discharge, no skin discoloration, no axillary or supraclavicular lymphadenopathy Abdomen: no palpable masses or tenderness, no rebound or guarding Extremities: no edema or skin discoloration or tenderness  Pelvic: Vulva: Normal             Vagina: No gross lesions or discharge  Cervix: No gross lesions or discharge.  Pap reflex done.  Uterus  AV, normal size, shape and consistency, non-tender and mobile  Adnexa  Without masses or tenderness  Anus: Normal   Assessment/Plan:  57 y.o. female for annual exam   1. Encounter for routine gynecological examination with Papanicolaou smear of cervix  Postmenopause since age 31 yo, well on no HRT.  No postmenopausal bleeding.  No pelvic pain.  Currently abstinent. Pap Neg in 07/2019. Pap reflex today. Urine and bowel movements normal. Breasts normal. Mammo Rt Neg 54/2024, Lt Dx Mammo Neg 12/2022.  Body mass index 26.61.  Health labs with Fam MD.  Alen Bleacher 10/2022. - Cytology - PAP( [facility])  2. Postmenopause  Postmenopause since age 41 yo, well on no HRT.  No postmenopausal bleeding.  No pelvic pain.  Currently abstinent.   Genia Del MD, 2:11 PM

## 2023-02-20 LAB — CYTOLOGY - PAP
Adequacy: ABSENT
Diagnosis: NEGATIVE

## 2023-02-22 IMAGING — MG MM DIGITAL SCREENING BILAT W/ TOMO AND CAD
8 series · 9 of 24 positions shown · non-contrast
Comparison: Previous exam(s).

CLINICAL DATA: Screening.

EXAM:
DIGITAL SCREENING BILATERAL MAMMOGRAM WITH TOMOSYNTHESIS AND CAD
TECHNIQUE: Bilateral screening digital craniocaudal and mediolateral oblique
mammograms were obtained. Bilateral screening digital breast
tomosynthesis was performed. The images were evaluated with
computer-aided detection.

[L CC synth-2D]
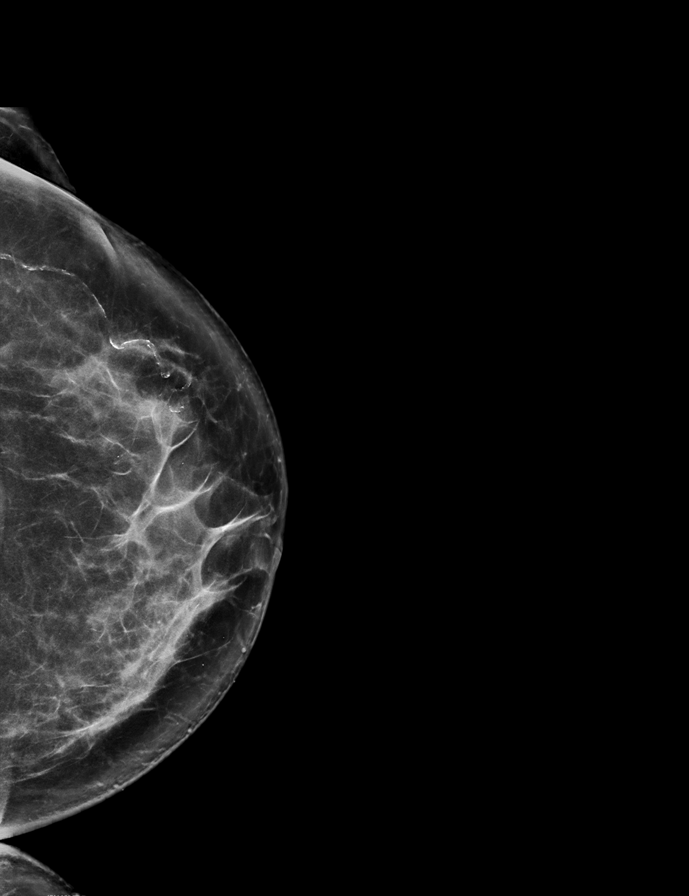

[L MLO synth-2D]
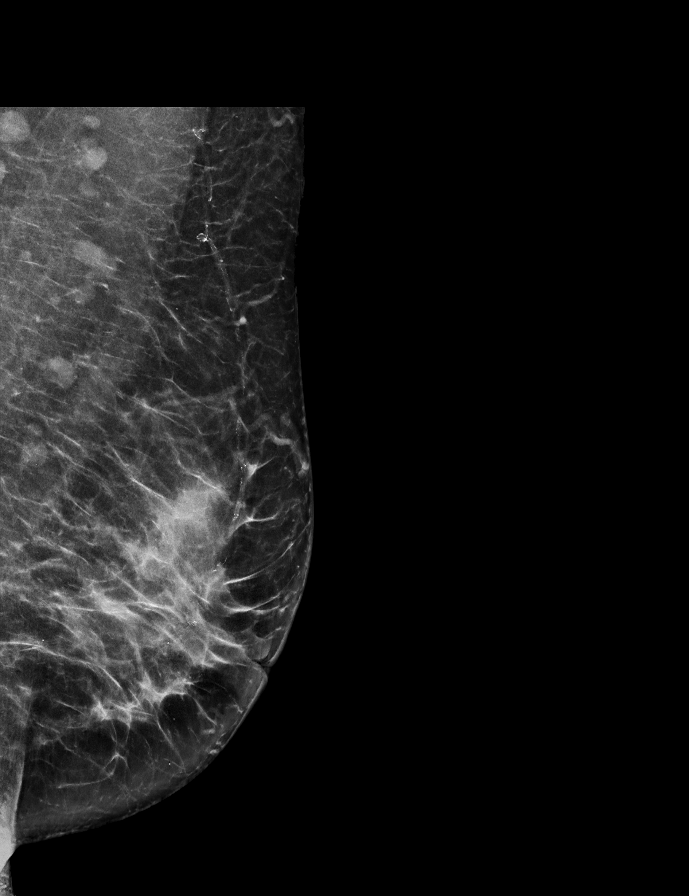

[R MLO synth-2D]
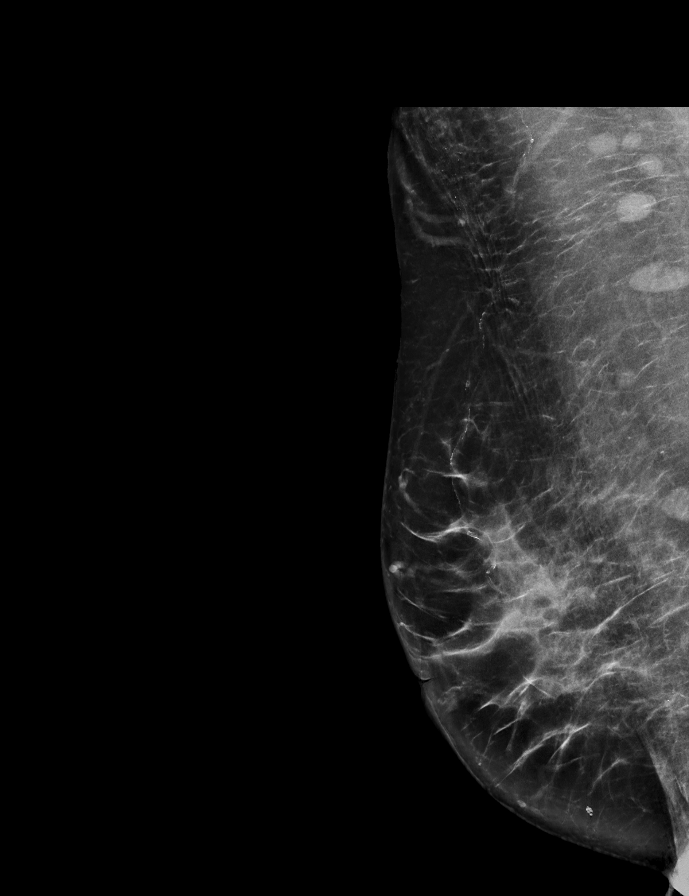

[R CC synth-2D]
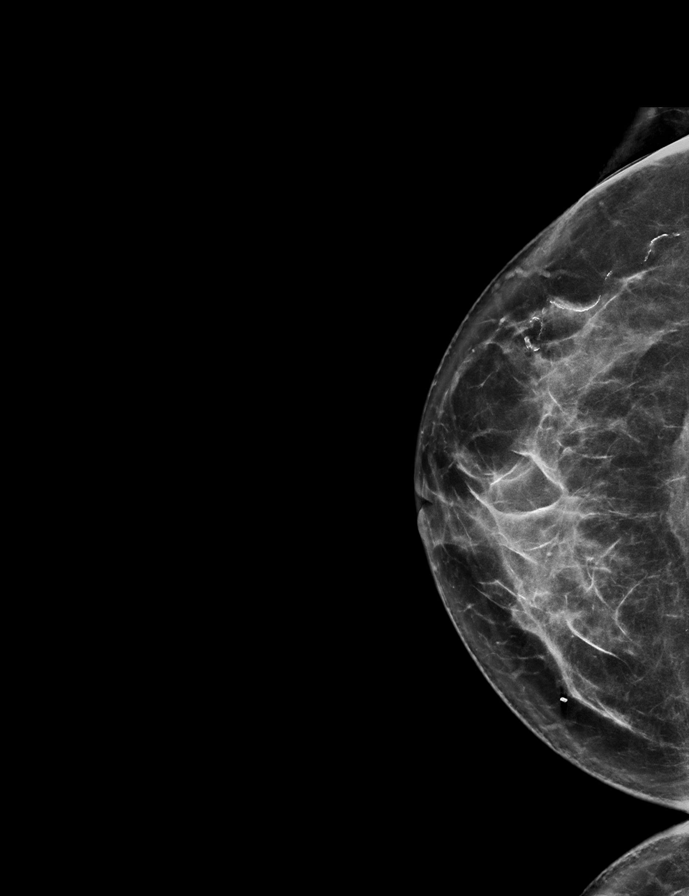

[R CC tomo · 2 of 71 frames shown]
[frame 23/71]
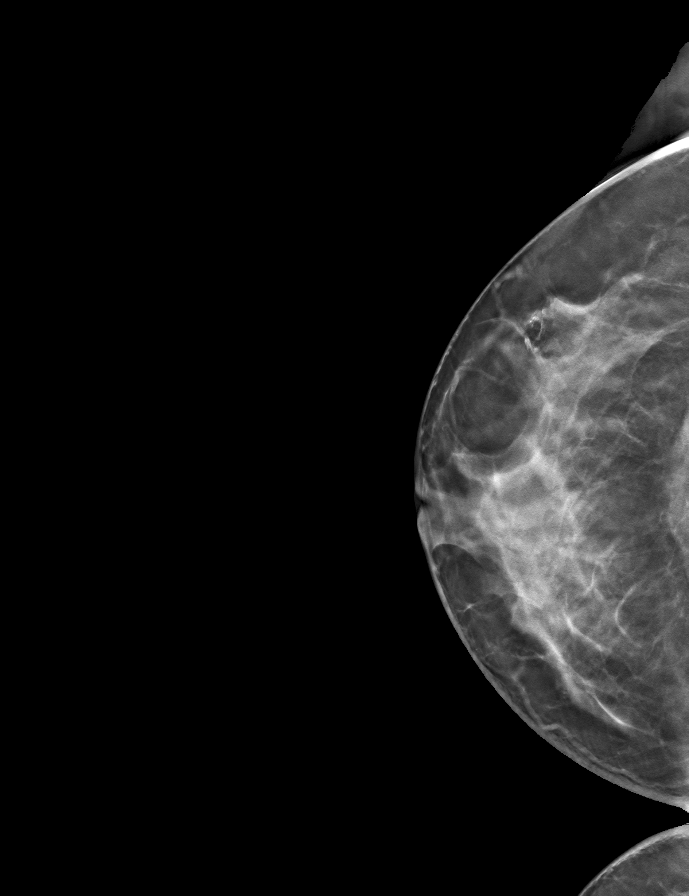
[frame 36/71]
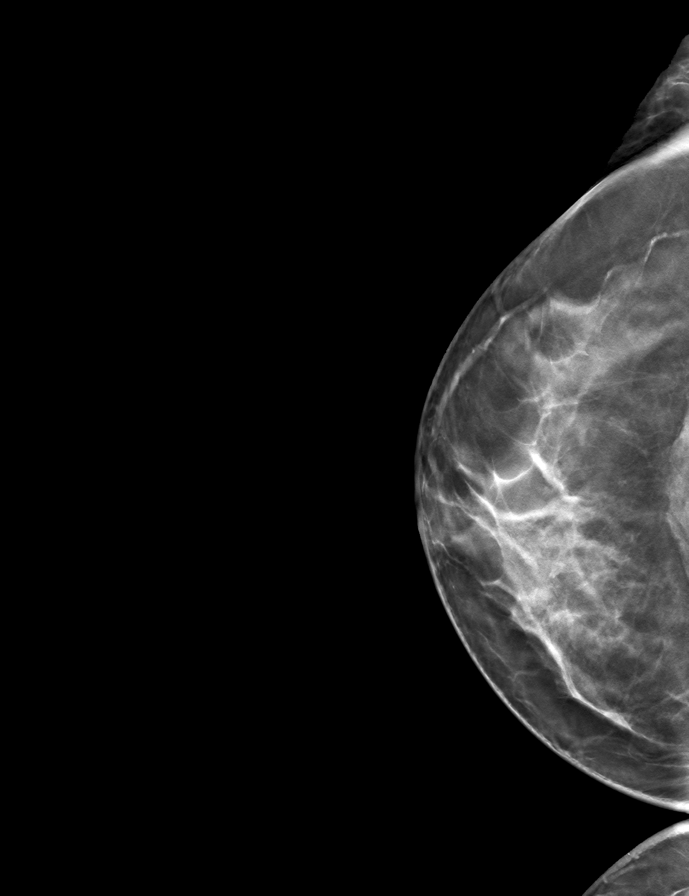

[L CC tomo · tomo slice 37/74.0]
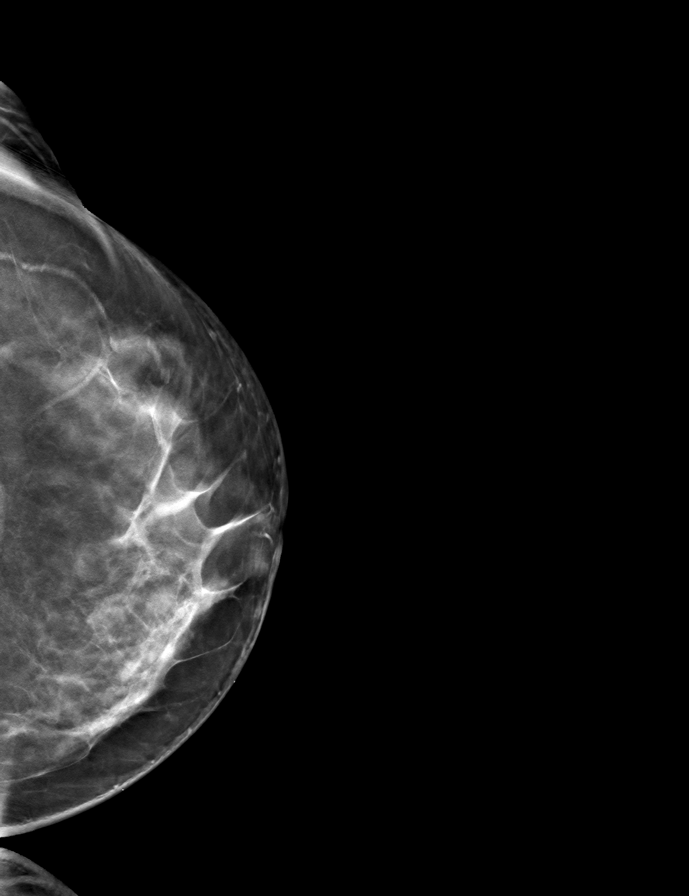

[L MLO tomo · tomo slice 35/70.0]
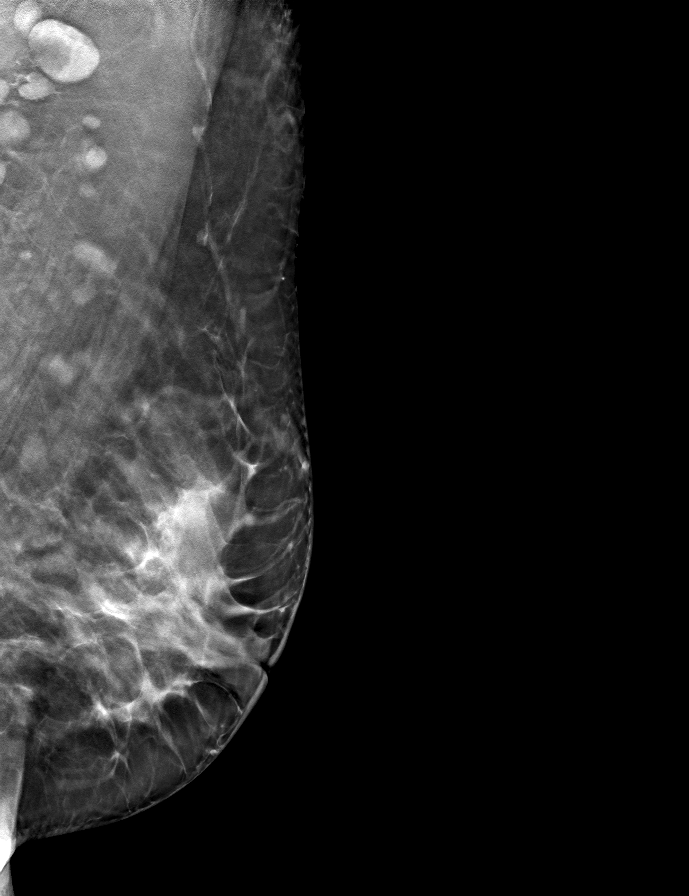

[R MLO tomo · tomo slice 40/79.0]
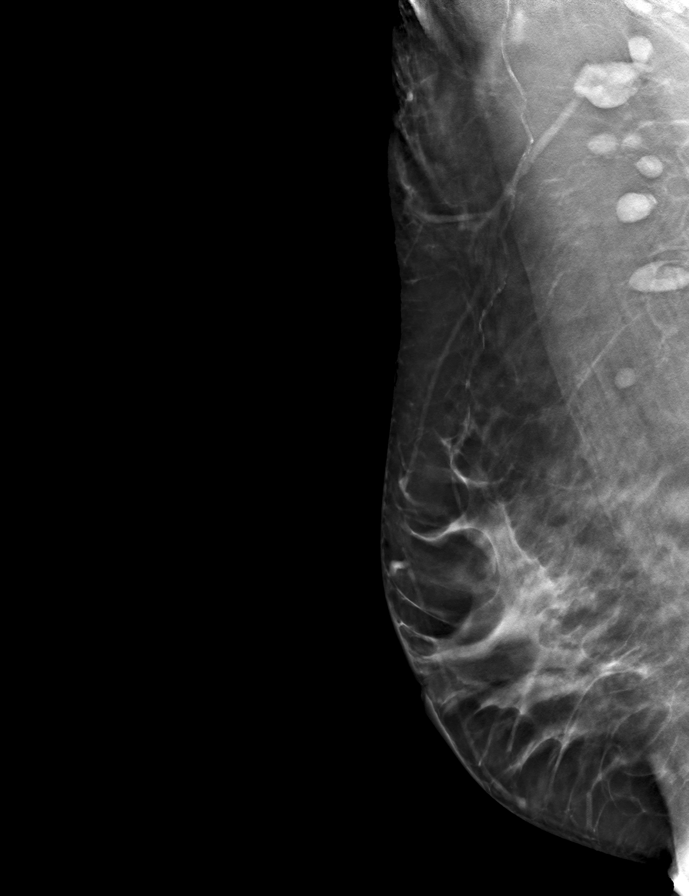

[9 of 24 positions shown; findings below may reference images not displayed]

ACR Breast Density Category c: The breast tissue is heterogeneously
dense, which may obscure small masses.
FINDINGS: There are no findings suspicious for malignancy.
IMPRESSION: No mammographic evidence of malignancy. A result letter of this
screening mammogram will be mailed directly to the patient.

RECOMMENDATION:
Screening mammogram in one year. (Code:Q3-[id number])

BI-RADS CATEGORY  1: Negative.

## 2023-05-30 ENCOUNTER — Other Ambulatory Visit: Payer: Self-pay | Admitting: Emergency Medicine

## 2023-05-30 DIAGNOSIS — E785 Hyperlipidemia, unspecified: Secondary | ICD-10-CM

## 2023-07-14 DIAGNOSIS — I87393 Chronic venous hypertension (idiopathic) with other complications of bilateral lower extremity: Secondary | ICD-10-CM | POA: Diagnosis not present

## 2023-07-14 DIAGNOSIS — I83893 Varicose veins of bilateral lower extremities with other complications: Secondary | ICD-10-CM | POA: Diagnosis not present

## 2023-07-14 DIAGNOSIS — R600 Localized edema: Secondary | ICD-10-CM | POA: Diagnosis not present

## 2023-07-14 DIAGNOSIS — I8312 Varicose veins of left lower extremity with inflammation: Secondary | ICD-10-CM | POA: Diagnosis not present

## 2023-07-14 DIAGNOSIS — I8311 Varicose veins of right lower extremity with inflammation: Secondary | ICD-10-CM | POA: Diagnosis not present

## 2023-08-13 DIAGNOSIS — I83891 Varicose veins of right lower extremities with other complications: Secondary | ICD-10-CM | POA: Diagnosis not present

## 2023-09-15 DIAGNOSIS — I83892 Varicose veins of left lower extremities with other complications: Secondary | ICD-10-CM | POA: Diagnosis not present

## 2023-09-17 ENCOUNTER — Encounter: Payer: Self-pay | Admitting: Emergency Medicine

## 2023-09-17 ENCOUNTER — Ambulatory Visit: Payer: BC Managed Care – PPO | Admitting: Emergency Medicine

## 2023-09-17 VITALS — BP 140/80 | HR 67 | Temp 98.3°F | Ht 59.0 in | Wt 144.0 lb

## 2023-09-17 DIAGNOSIS — I700 Atherosclerosis of aorta: Secondary | ICD-10-CM | POA: Diagnosis not present

## 2023-09-17 DIAGNOSIS — E1169 Type 2 diabetes mellitus with other specified complication: Secondary | ICD-10-CM | POA: Diagnosis not present

## 2023-09-17 DIAGNOSIS — I152 Hypertension secondary to endocrine disorders: Secondary | ICD-10-CM | POA: Diagnosis not present

## 2023-09-17 DIAGNOSIS — E785 Hyperlipidemia, unspecified: Secondary | ICD-10-CM

## 2023-09-17 DIAGNOSIS — B9789 Other viral agents as the cause of diseases classified elsewhere: Secondary | ICD-10-CM

## 2023-09-17 DIAGNOSIS — J028 Acute pharyngitis due to other specified organisms: Secondary | ICD-10-CM

## 2023-09-17 DIAGNOSIS — E1159 Type 2 diabetes mellitus with other circulatory complications: Secondary | ICD-10-CM | POA: Diagnosis not present

## 2023-09-17 LAB — POCT GLYCOSYLATED HEMOGLOBIN (HGB A1C): Hemoglobin A1C: 6.4 % — AB (ref 4.0–5.6)

## 2023-09-17 NOTE — Progress Notes (Signed)
Autumn Haley 58 y.o.   Chief Complaint  Patient presents with   Sore Throat    Been feeling like this for 3 days, hurts to swallow. Left ear pain.no fever no body aches     HISTORY OF PRESENT ILLNESS: This is a 58 y.o. female complaining of sore throat that started 3 days ago Mild left ear pain.  No other associated symptoms. Also has a history of diabetes and hypertension.  Here for follow-up of those as well. No longer taking metformin.  Only taking lisinopril and atorvastatin. Eating well.  Better nutrition. No other complaints or medical concerns today.  Sore Throat  Associated symptoms include congestion, coughing and ear pain. Pertinent negatives include no abdominal pain, diarrhea, headaches or vomiting.     Prior to Admission medications   Medication Sig Start Date End Date Taking? Authorizing Provider  atorvastatin (LIPITOR) 20 MG tablet Take 1 tablet by mouth once daily 05/30/23  Yes Chanc Kervin, Eilleen Kempf, MD  DULoxetine (CYMBALTA) 30 MG capsule Take 1 capsule (30 mg total) by mouth daily. 12/16/22  Yes Joee Iovine, Eilleen Kempf, MD  lisinopril (ZESTRIL) 20 MG tablet Take 1 tablet (20 mg total) by mouth daily. 09/16/22  Yes Georgina Quint, MD    Allergies  Allergen Reactions   Levaquin [Levofloxacin] Rash    Patient Active Problem List   Diagnosis Date Noted   Gastroesophageal reflux disease with esophagitis without hemorrhage 12/16/2022   Situational mixed anxiety and depressive disorder 12/16/2022   Nonspecific chest pain 12/16/2022   Aortic atherosclerosis (HCC) 12/16/2022   Dyslipidemia associated with type 2 diabetes mellitus (HCC) 11/26/2021   Chronic venous insufficiency 11/26/2021   Hypertension associated with diabetes (HCC) 08/22/2021   Dyslipidemia 08/22/2021   Chronic pain of left knee 03/17/2018   Primary osteoarthritis of left knee 03/17/2018   Osteoarthritis of fingers of hands, bilateral 06/17/2017   Arthralgia 02/23/2017   Vitamin D  deficiency 02/11/2017   Fibroid uterus 12/26/2011    Past Medical History:  Diagnosis Date   Arthritis    Chronic kidney disease    right kidney   Fibroid    FIBROID UTERUS   NSVD (normal spontaneous vaginal delivery)    X 2   NSVD (normal spontaneous vaginal delivery)    X 2   SAB (spontaneous abortion)    SAB (spontaneous abortion)    Vitamin D deficiency     No past surgical history on file.  Social History   Socioeconomic History   Marital status: Married    Spouse name: Emeline Gins   Number of children: 2   Years of education: 12   Highest education level: Not on file  Occupational History   Occupation: Music therapist: [employer]    Comment: Housekeeping  Tobacco Use   Smoking status: Never   Smokeless tobacco: Never  Vaping Use   Vaping status: Never Used  Substance and Sexual Activity   Alcohol use: No    Comment: OCCASIONALLY   Drug use: No   Sexual activity: Not Currently    Partners: Male    Birth control/protection: Rhythm  Other Topics Concern   Not on file  Social History Narrative   ** Merged History Encounter **       From [place], [place].  Lives here with her husband and 2 children.   Social Drivers of Corporate investment banker Strain: Not on file  Food Insecurity: Not on file  Transportation Needs: Not on file  Physical  Activity: Not on file  Stress: Not on file  Social Connections: Not on file  Intimate Partner Violence: Not on file    No family history on file.   Review of Systems  Constitutional:  Negative for chills and fever.  HENT:  Positive for congestion, ear pain and sore throat.   Respiratory:  Positive for cough.   Cardiovascular: Negative.  Negative for chest pain.  Gastrointestinal:  Negative for abdominal pain, diarrhea, nausea and vomiting.  Genitourinary: Negative.  Negative for dysuria and hematuria.  Skin: Negative.  Negative for rash.  Neurological: Negative.  Negative for dizziness and  headaches.  All other systems reviewed and are negative.   Vitals:   09/17/23 1039  BP: (!) 140/80  Pulse: 67  Temp: 98.3 F (36.8 C)  SpO2: 98%    Physical Exam Vitals reviewed.  Constitutional:      Appearance: She is well-developed.  HENT:     Head: Normocephalic.     Right Ear: Tympanic membrane, ear canal and external ear normal.     Left Ear: Tympanic membrane, ear canal and external ear normal.     Mouth/Throat:     Mouth: Mucous membranes are moist.     Pharynx: Oropharynx is clear. Posterior oropharyngeal erythema present. No oropharyngeal exudate.  Eyes:     Extraocular Movements: Extraocular movements intact.     Conjunctiva/sclera: Conjunctivae normal.     Pupils: Pupils are equal, round, and reactive to light.  Cardiovascular:     Rate and Rhythm: Normal rate and regular rhythm.     Pulses: Normal pulses.     Heart sounds: Normal heart sounds.  Musculoskeletal:     Cervical back: No tenderness.  Lymphadenopathy:     Cervical: No cervical adenopathy.  Skin:    General: Skin is warm and dry.     Capillary Refill: Capillary refill takes less than 2 seconds.  Neurological:     Mental Status: She is alert and oriented to person, place, and time.  Psychiatric:        Mood and Affect: Mood normal.        Behavior: Behavior normal.    Results for orders placed or performed in visit on 09/17/23 (from the past 24 hours)  POCT glycosylated hemoglobin (Hb A1C)     Status: Abnormal   Collection Time: 09/17/23 11:04 AM  Result Value Ref Range   Hemoglobin A1C 6.4 (A) 4.0 - 5.6 %   HbA1c POC (<> result, manual entry)     HbA1c, POC (prediabetic range)     HbA1c, POC (controlled diabetic range)       ASSESSMENT & PLAN: A total of 42 minutes was spent with the patient and counseling/coordination of care regarding preparing for this visit, review of most recent office visit notes, review of multiple chronic medical conditions and their management, diagnosis of  viral pharyngitis and management, treatment of sore throat, review of all medications, review of most recent bloodwork results including interpretation of today's hemoglobin A1c, review of health maintenance items, education on nutrition, prognosis, documentation, and need for follow up.   Problem List Items Addressed This Visit       Cardiovascular and Mediastinum   Hypertension associated with diabetes (HCC) - Primary   Well-controlled hypertension. Continue lisinopril 20 mg daily Well-controlled diabetes with hemoglobin A1c of 6.4 Not taking metformin anymore Cardiovascular risks associated with hypertension and diabetes discussed Dietary approaches to stop hypertension discussed      Relevant Orders  POCT glycosylated hemoglobin (Hb A1C) (Completed)   Aortic atherosclerosis (HCC)   Diet and nutrition discussed Continue atorvastatin 20 mg daily        Respiratory   Sore throat due to virus   Clinically stable.  Running its course without complications No red flag signs or symptoms No findings of strep throat Symptom management discussed Pain management discussed Advised to contact the office if no better or worse during the next several days        Endocrine   Dyslipidemia associated with type 2 diabetes mellitus (HCC)   Chronic stable condition. Continues atorvastatin 20 mg daily      Relevant Orders   POCT glycosylated hemoglobin (Hb A1C) (Completed)   Patient Instructions  Faringitis Pharyngitis  La faringitis es un dolor de garganta (faringe). Se produce cuando la garganta presenta enrojecimiento, dolor e hinchazn. La mayora de las veces, esta afeccin mejora por s sola. En algunos casos, podra requerir la administracin de medicamentos. Cules son las causas? Infeccin por un virus. Infeccin por bacterias. Alergias. Qu incrementa el riesgo? Tener entre 5 y [address]. Estar en ambientes con mucha gente. Estos incluyen: Guarderas  infantiles. Escuelas. Residencias estudiantiles. Vivir en un lugar con temperaturas fras al [employer]. Tener debilitado el sistema que combate las enfermedades (inmunitario). Cules son los signos o sntomas? Los sntomas pueden variar segn la causa. Los sntomas frecuentes son: Dolor de Advertising copywriter. Cansancio (fatiga). Fiebre no muy alta. Congestin nasal. Tos. Dolor de [place]. Otros sntomas pueden incluir lo siguiente: Ganglios en el cuello (ganglios linfticos) que estn hinchados. Erupciones cutneas. Pelcula en la garganta o amgdalas. Esto tambin puede ser causado por una infeccin bacteriana. Vmitos. Enrojecimiento y [employer]. Prdida del apetito. Dolores musculares y en las articulaciones. Amgdalas que estn temporalmente ms grandes de lo habitual (agrandadas). Cmo se trata? Muchas veces el tratamiento no es necesario. Generalmente, esta afeccin mejora en el trmino de 3 o 4 das sin [place]. Si la infeccin es causada por bacterias, es posible que deba tomar antibiticos. Siga estas instrucciones en su casa: Medicamentos Use los medicamentos de venta libre y los recetados solamente como se lo haya indicado el mdico. Si le recetaron un antibitico, tmelo como se lo haya indicado el mdico. No deje de tomar el antibitico, aunque comience a Actor. Use pastillas o aerosoles para Engineer, materials de garganta como se lo indique el mdico. Los nios pueden contraer faringitis. No le d aspirina al nio. Control del dolor Para ayudar a Engineer, materials, intente lo siguiente: Neal Dy a sorbos lquidos calientes, por ejemplo: Caldos. T de hierbas. Agua tibia. Tambin puede comer o beber lquidos fros o congelados, tales como paletas de hielo congelado. Enjuagarse la boca Arts administrator) con Burlene Arnt de agua con sal 3 o 4 veces al da, o cuando sea necesario. Para preparar agua con sal, disuelva de  a 1 cucharadita (de 3 a 6 g) de sal en 1  taza (237 ml) de agua tibia. No trague esta mezcla. Chupe caramelos duros o pastillas para la garganta. Ponga un humidificador de vapor fro en la habitacin por la noche para humedecer el aire. Tambin puede abrir el agua caliente de la ducha y sentarse en el bao con la puerta cerrada durante 5 a 10 minutos.  Instrucciones generales  No fume ni consuma ningn producto que contenga nicotina o tabaco. Si necesita ayuda para dejar de fumar, consulte al mdico. Haga reposo como se lo haya indicado el mdico. [place]  suficiente lquido para Radio producer pis (la orina) de color amarillo plido. Cmo se evita? Lvese las manos frecuentemente con agua y jabn durante al menos 20 segundos. Use desinfectante para manos si no dispone de [place] y [place]. No se toque los ojos, la nariz o la boca sin antes [employer]. Lvese las manos despus de tocar estas zonas. No comparta vasos ni utensilios para comer. Evite el contacto cercano con personas que estn enfermas. Comunquese con un mdico si: Tiene bultos grandes y dolorosos en el cuello. Tiene una erupcin cutnea. Cuando tose elimina una expectoracin verde, amarilla amarronada o con [place]. Solicite ayuda de inmediato si: Tiene rigidez en el cuello. Babea o no puede tragar lquidos. No puede beber ni tomar medicamentos sin vomitar. Siente un dolor intenso que no se alivia con medicamentos. Tiene problemas para respirar que no se deben a la congestin nasal. Tiene dolor e hinchazn en las rodillas, los tobillos, las [place] o los codos que antes no tena. Estos sntomas pueden Customer service manager. Solicite ayuda de inmediato. Comunquese con el servicio de emergencias de su localidad (911 en los [place]). No espere a ver si los sntomas desaparecen. No conduzca por sus propios medios [employer]. Resumen La faringitis es un dolor de garganta (faringe). Se produce cuando la garganta presenta enrojecimiento, dolor e  hinchazn. La mayora de las veces, la faringitis mejora por s sola. A veces, puede requerir la administracin de medicamentos. Si le recetaron un antibitico, tmelo como se lo haya indicado el mdico. No deje de tomar el antibitico, aunque comience a sentirse mejor. Esta informacin no tiene Theme park manager el consejo del mdico. Asegrese de hacerle al mdico cualquier pregunta que tenga. Document Revised: 12/01/2020 Document Reviewed: 12/01/2020 Elsevier Patient Education  2024 Elsevier Inc.     Edwina Barth, MD  Primary Care at [facility]

## 2023-09-17 NOTE — Assessment & Plan Note (Signed)
Clinically stable.  Running its course without complications No red flag signs or symptoms No findings of strep throat Symptom management discussed Pain management discussed Advised to contact the office if no better or worse during the next several days

## 2023-09-17 NOTE — Assessment & Plan Note (Addendum)
Well-controlled hypertension. Continue lisinopril 20 mg daily Well-controlled diabetes with hemoglobin A1c of 6.4 Not taking metformin anymore Cardiovascular risks associated with hypertension and diabetes discussed Dietary approaches to stop hypertension discussed

## 2023-09-17 NOTE — Assessment & Plan Note (Signed)
Chronic stable condition. Continues atorvastatin 20 mg daily

## 2023-09-17 NOTE — Assessment & Plan Note (Signed)
Diet and nutrition discussed.  Continue atorvastatin 20 mg daily. 

## 2023-09-17 NOTE — Patient Instructions (Signed)
 Faringitis Pharyngitis  La faringitis es un dolor de garganta (faringe). Se produce cuando la garganta presenta enrojecimiento, dolor e hinchazn. La mayora de las veces, esta afeccin mejora por s sola. En algunos casos, podra requerir la administracin de medicamentos. Cules son las causas? Infeccin por un virus. Infeccin por bacterias. Alergias. Qu incrementa el riesgo? Tener entre 5 y [address]. Estar en ambientes con mucha gente. Estos incluyen: Guarderas infantiles. Escuelas. Residencias estudiantiles. Vivir en un lugar con temperaturas fras al [employer]. Tener debilitado el sistema que combate las enfermedades (inmunitario). Cules son los signos o sntomas? Los sntomas pueden variar segn la causa. Los sntomas frecuentes son: Dolor de Advertising copywriter. Cansancio (fatiga). Fiebre no muy alta. Congestin nasal. Tos. Dolor de [place]. Otros sntomas pueden incluir lo siguiente: Ganglios en el cuello (ganglios linfticos) que estn hinchados. Erupciones cutneas. Pelcula en la garganta o amgdalas. Esto tambin puede ser causado por una infeccin bacteriana. Vmitos. Enrojecimiento y [employer]. Prdida del apetito. Dolores musculares y en las articulaciones. Amgdalas que estn temporalmente ms grandes de lo habitual (agrandadas). Cmo se trata? Muchas veces el tratamiento no es necesario. Generalmente, esta afeccin mejora en el trmino de 3 o 4 das sin [place]. Si la infeccin es causada por bacterias, es posible que deba tomar antibiticos. Siga estas instrucciones en su casa: Medicamentos Use los medicamentos de venta libre y los recetados solamente como se lo haya indicado el mdico. Si le recetaron un antibitico, tmelo como se lo haya indicado el mdico. No deje de tomar el antibitico, aunque comience a Actor. Use pastillas o aerosoles para Engineer, materials de garganta como se lo indique el mdico. Los nios pueden contraer  faringitis. No le d aspirina al nio. Control del dolor Para ayudar a Engineer, materials, intente lo siguiente: Neal Dy a sorbos lquidos calientes, por ejemplo: Caldos. T de hierbas. Agua tibia. Tambin puede comer o beber lquidos fros o congelados, tales como paletas de hielo congelado. Enjuagarse la boca Arts administrator) con Burlene Arnt de agua con sal 3 o 4 veces al da, o cuando sea necesario. Para preparar agua con sal, disuelva de  a 1 cucharadita (de 3 a 6 g) de sal en 1 taza (237 ml) de agua tibia. No trague esta mezcla. Chupe caramelos duros o pastillas para la garganta. Ponga un humidificador de vapor fro en la habitacin por la noche para humedecer el aire. Tambin puede abrir el agua caliente de la ducha y sentarse en el bao con la puerta cerrada durante 5 a 10 minutos.  Instrucciones generales  No fume ni consuma ningn producto que contenga nicotina o tabaco. Si necesita ayuda para dejar de fumar, consulte al mdico. Haga reposo como se lo haya indicado el mdico. [place] suficiente lquido para mantener el pis (la orina) de color amarillo plido. Cmo se evita? Lvese las manos frecuentemente con agua y jabn durante al menos 20 segundos. Use desinfectante para manos si no dispone de [place] y [place]. No se toque los ojos, la nariz o la boca sin antes [employer]. Lvese las manos despus de tocar estas zonas. No comparta vasos ni utensilios para comer. Evite el contacto cercano con personas que estn enfermas. Comunquese con un mdico si: Tiene bultos grandes y dolorosos en el cuello. Tiene una erupcin cutnea. Cuando tose elimina una expectoracin verde, amarilla amarronada o con [place]. Solicite ayuda de inmediato si: Tiene rigidez en el cuello. Babea o no puede tragar lquidos. No puede beber ni tomar medicamentos  sin vomitar. Siente un dolor intenso que no se alivia con medicamentos. Tiene problemas para respirar que no se deben a la congestin nasal. Tiene  dolor e hinchazn en las rodillas, los tobillos, las [place] o los codos que antes no tena. Estos sntomas pueden Customer service manager. Solicite ayuda de inmediato. Comunquese con el servicio de emergencias de su localidad (911 en los [place]). No espere a ver si los sntomas desaparecen. No conduzca por sus propios medios [employer]. Resumen La faringitis es un dolor de garganta (faringe). Se produce cuando la garganta presenta enrojecimiento, dolor e hinchazn. La mayora de las veces, la faringitis mejora por s sola. A veces, puede requerir la administracin de medicamentos. Si le recetaron un antibitico, tmelo como se lo haya indicado el mdico. No deje de tomar el antibitico, aunque comience a sentirse mejor. Esta informacin no tiene Theme park manager el consejo del mdico. Asegrese de hacerle al mdico cualquier pregunta que tenga. Document Revised: 12/01/2020 Document Reviewed: 12/01/2020 Elsevier Patient Education  2024 [employer].

## 2023-09-23 ENCOUNTER — Other Ambulatory Visit: Payer: Self-pay | Admitting: Emergency Medicine

## 2023-09-23 DIAGNOSIS — I10 Essential (primary) hypertension: Secondary | ICD-10-CM

## 2023-09-23 DIAGNOSIS — I83891 Varicose veins of right lower extremities with other complications: Secondary | ICD-10-CM | POA: Diagnosis not present

## 2023-09-30 DIAGNOSIS — I83892 Varicose veins of left lower extremities with other complications: Secondary | ICD-10-CM | POA: Diagnosis not present

## 2023-10-07 DIAGNOSIS — I83891 Varicose veins of right lower extremities with other complications: Secondary | ICD-10-CM | POA: Diagnosis not present

## 2023-10-14 DIAGNOSIS — I83892 Varicose veins of left lower extremities with other complications: Secondary | ICD-10-CM | POA: Diagnosis not present

## 2023-10-30 ENCOUNTER — Other Ambulatory Visit: Payer: Self-pay | Admitting: Emergency Medicine

## 2023-10-30 DIAGNOSIS — E785 Hyperlipidemia, unspecified: Secondary | ICD-10-CM

## 2023-12-15 ENCOUNTER — Ambulatory Visit: Admitting: Emergency Medicine

## 2023-12-15 ENCOUNTER — Encounter: Payer: Self-pay | Admitting: Emergency Medicine

## 2023-12-15 VITALS — BP 132/80 | HR 59 | Temp 98.6°F | Ht 59.0 in | Wt 143.0 lb

## 2023-12-15 DIAGNOSIS — I152 Hypertension secondary to endocrine disorders: Secondary | ICD-10-CM

## 2023-12-15 DIAGNOSIS — E785 Hyperlipidemia, unspecified: Secondary | ICD-10-CM | POA: Diagnosis not present

## 2023-12-15 DIAGNOSIS — E1159 Type 2 diabetes mellitus with other circulatory complications: Secondary | ICD-10-CM | POA: Diagnosis not present

## 2023-12-15 DIAGNOSIS — E1169 Type 2 diabetes mellitus with other specified complication: Secondary | ICD-10-CM | POA: Diagnosis not present

## 2023-12-15 LAB — MICROALBUMIN / CREATININE URINE RATIO
Creatinine,U: 77.3 mg/dL
Microalb Creat Ratio: UNDETERMINED mg/g (ref 0.0–30.0)
Microalb, Ur: <0.7 mg/dL

## 2023-12-15 LAB — CBC WITH DIFFERENTIAL/PLATELET
Basophils Absolute: 0.1 10*3/uL (ref 0.0–0.1)
Basophils Relative: 0.7 % (ref 0.0–3.0)
Eosinophils Absolute: 0.1 10*3/uL (ref 0.0–0.7)
Eosinophils Relative: 1.4 % (ref 0.0–5.0)
HCT: 40.5 % (ref 36.0–46.0)
Hemoglobin: 13.6 g/dL (ref 12.0–15.0)
Lymphocytes Relative: 33 % (ref 12.0–46.0)
Lymphs Abs: 2.8 10*3/uL (ref 0.7–4.0)
MCHC: 33.5 g/dL (ref 30.0–36.0)
MCV: 88.2 fl (ref 78.0–100.0)
Monocytes Absolute: 0.7 10*3/uL (ref 0.1–1.0)
Monocytes Relative: 8 % (ref 3.0–12.0)
Neutro Abs: 4.8 10*3/uL (ref 1.4–7.7)
Neutrophils Relative %: 56.9 % (ref 43.0–77.0)
Platelets: 264 10*3/uL (ref 150.0–400.0)
RBC: 4.59 Mil/uL (ref 3.87–5.11)
RDW: 13.5 % (ref 11.5–15.5)
WBC: 8.5 10*3/uL (ref 4.0–10.5)

## 2023-12-15 LAB — LIPID PANEL
Cholesterol: 127 mg/dL (ref 0–200)
HDL: 56.3 mg/dL (ref >39.00)
LDL Cholesterol: 57 mg/dL (ref 0–99)
NonHDL: 70.87
Total CHOL/HDL Ratio: 2
Triglycerides: 69 mg/dL (ref 0.0–149.0)
VLDL: 13.8 mg/dL (ref 0.0–40.0)

## 2023-12-15 LAB — COMPREHENSIVE METABOLIC PANEL WITH GFR
ALT: 18 U/L (ref 0–35)
AST: 21 U/L (ref 0–37)
Albumin: 4.2 g/dL (ref 3.5–5.2)
Alkaline Phosphatase: 56 U/L (ref 39–117)
BUN: 14 mg/dL (ref 6–23)
CO2: 29 meq/L (ref 19–32)
Calcium: 8.8 mg/dL (ref 8.4–10.5)
Chloride: 103 meq/L (ref 96–112)
Creatinine, Ser: 0.58 mg/dL (ref 0.40–1.20)
GFR: 101.64 mL/min (ref >60.00)
Glucose, Bld: 97 mg/dL (ref 70–99)
Potassium: 4.5 meq/L (ref 3.5–5.1)
Sodium: 137 meq/L (ref 135–145)
Total Bilirubin: 0.5 mg/dL (ref 0.2–1.2)
Total Protein: 7.2 g/dL (ref 6.0–8.3)

## 2023-12-15 LAB — HEMOGLOBIN A1C: Hgb A1c MFr Bld: 6.6 % — ABNORMAL HIGH (ref 4.6–6.5)

## 2023-12-15 MED ORDER — LISINOPRIL 40 MG PO TABS
40.0000 mg | ORAL_TABLET | Freq: Every day | ORAL | 3 refills | Status: AC
Start: 2023-12-15 — End: ?

## 2023-12-15 NOTE — Progress Notes (Signed)
 Autumn Haley 58 y.o.   Chief Complaint  Patient presents with   Hypertension    Patient states its been high, the highest being 161/101. She does have some headaches and some blurred vision. She states may need to up dose in her bp medication, she has been taking it everyday.     HISTORY OF PRESENT ILLNESS: This is a 58 y.o. female A1A complaining of elevated blood pressure readings in multiple locations at home over the past couple of weeks Has intermittent headaches and intermittent blurry vision Taking lisinopril  20 mg daily No other complaints or medical concerns today.  Hypertension Pertinent negatives include no chest pain, headaches, palpitations or shortness of breath.     Prior to Admission medications   Medication Sig Start Date End Date Taking? Authorizing Provider  atorvastatin  (LIPITOR) 20 MG tablet Take 1 tablet by mouth once daily 10/30/23  Yes Parisa Pinela, Isidro Margo, MD  DULoxetine  (CYMBALTA ) 30 MG capsule Take 1 capsule (30 mg total) by mouth daily. 12/16/22  Yes Elvira Hammersmith, MD  lisinopril  (ZESTRIL ) 20 MG tablet Take 1 tablet by mouth once daily 09/23/23  Yes Cuca Benassi, Isidro Margo, MD    Allergies  Allergen Reactions   Levaquin  [Levofloxacin ] Rash    Patient Active Problem List   Diagnosis Date Noted   Sore throat due to virus 09/17/2023   Gastroesophageal reflux disease with esophagitis without hemorrhage 12/16/2022   Aortic atherosclerosis (HCC) 12/16/2022   Dyslipidemia associated with type 2 diabetes mellitus (HCC) 11/26/2021   Chronic venous insufficiency 11/26/2021   Hypertension associated with diabetes (HCC) 08/22/2021   Dyslipidemia 08/22/2021   Chronic pain of left knee 03/17/2018   Primary osteoarthritis of left knee 03/17/2018   Osteoarthritis of fingers of hands, bilateral 06/17/2017   Arthralgia 02/23/2017   Vitamin D  deficiency 02/11/2017   Fibroid uterus 12/26/2011    Past Medical History:  Diagnosis Date   Arthritis     Chronic kidney disease    right kidney   Fibroid    FIBROID UTERUS   NSVD (normal spontaneous vaginal delivery)    X 2   NSVD (normal spontaneous vaginal delivery)    X 2   SAB (spontaneous abortion)    SAB (spontaneous abortion)    Vitamin D  deficiency     No past surgical history on file.  Social History   Socioeconomic History   Marital status: Married    Spouse name: Monette Angus   Number of children: 2   Years of education: 12   Highest education level: Not on file  Occupational History   Occupation: Music therapist: [employer]    Comment: Housekeeping  Tobacco Use   Smoking status: Never   Smokeless tobacco: Never  Vaping Use   Vaping status: Never Used  Substance and Sexual Activity   Alcohol use: No    Comment: OCCASIONALLY   Drug use: No   Sexual activity: Not Currently    Partners: Male    Birth control/protection: Rhythm  Other Topics Concern   Not on file  Social History Narrative   ** Merged History Encounter **       From [place], [place].  Lives here with her husband and 2 children.   Social Drivers of Corporate investment banker Strain: Not on file  Food Insecurity: Not on file  Transportation Needs: Not on file  Physical Activity: Not on file  Stress: Not on file  Social Connections: Not on file  Intimate Partner Violence:  Not on file    No family history on file.   Review of Systems  Constitutional: Negative.  Negative for chills and fever.  HENT: Negative.  Negative for congestion and sore throat.   Respiratory: Negative.  Negative for cough and shortness of breath.   Cardiovascular: Negative.  Negative for chest pain and palpitations.  Gastrointestinal:  Negative for abdominal pain, diarrhea, nausea and vomiting.  Genitourinary: Negative.  Negative for dysuria and hematuria.  Skin: Negative.  Negative for rash.  Neurological: Negative.  Negative for dizziness and headaches.  All other systems reviewed and are  negative.   Vitals:   12/15/23 1345  BP: 132/80  Pulse: (!) 59  Temp: 98.6 F (37 C)  SpO2: 95%    Physical Exam Vitals reviewed.  Constitutional:      Appearance: Normal appearance.  HENT:     Head: Normocephalic.     Mouth/Throat:     Mouth: Mucous membranes are moist.     Pharynx: Oropharynx is clear.  Eyes:     Extraocular Movements: Extraocular movements intact.     Pupils: Pupils are equal, round, and reactive to light.  Cardiovascular:     Rate and Rhythm: Normal rate and regular rhythm.     Pulses: Normal pulses.     Heart sounds: Normal heart sounds.  Pulmonary:     Effort: Pulmonary effort is normal.     Breath sounds: Normal breath sounds.  Musculoskeletal:     Cervical back: No tenderness.     Right lower leg: No edema.     Left lower leg: No edema.  Lymphadenopathy:     Cervical: No cervical adenopathy.  Skin:    General: Skin is warm and dry.     Capillary Refill: Capillary refill takes less than 2 seconds.  Neurological:     General: No focal deficit present.     Mental Status: She is alert and oriented to person, place, and time.  Psychiatric:        Mood and Affect: Mood normal.        Behavior: Behavior normal.      ASSESSMENT & PLAN: A total of 42 minutes was spent with the patient and counseling/coordination of care regarding preparing for this visit, review of most recent office visit notes, review of multiple chronic medical conditions and their management, cardiovascular risks associated with uncontrolled hypertension, review of all medications and changes made, review of most recent bloodwork results, review of health maintenance items, education on nutrition, prognosis, documentation, and need for follow up.   Problem List Items Addressed This Visit       Cardiovascular and Mediastinum   Hypertension associated with diabetes (HCC) - Primary   Uncontrolled hypertension Recommend to increase lisinopril  to 40 mg daily Well-controlled  diabetes off metformin  Recommend blood work today Cardiovascular risks associated with hypertension and diabetes discussed Diet and nutrition discussed      Relevant Medications   lisinopril  (ZESTRIL ) 40 MG tablet   Other Relevant Orders   Comprehensive metabolic panel with GFR   CBC with Differential/Platelet   Hemoglobin A1c   Lipid panel   Microalbumin / creatinine urine ratio     Endocrine   Dyslipidemia associated with type 2 diabetes mellitus (HCC)   Chronic stable condition. Continues atorvastatin  20 mg daily Lipid profile done today. Diet and nutrition discussed The 10-year ASCVD risk score (Arnett DK, et al., 2019) is: 3.2%   Values used to calculate the score:     Age:  57 years     Sex: Female     Is Non-Hispanic African American: No     Diabetic: Yes     Tobacco smoker: No     Systolic Blood Pressure: 132 mmHg     Is BP treated: Yes     HDL Cholesterol: 61.9 mg/dL     Total Cholesterol: 135 mg/dL       Relevant Medications   lisinopril  (ZESTRIL ) 40 MG tablet   Other Relevant Orders   Comprehensive metabolic panel with GFR   CBC with Differential/Platelet   Hemoglobin A1c   Lipid panel   Microalbumin / creatinine urine ratio   Patient Instructions  Hipertensin en los adultos Hypertension, Adult El trmino hipertensin es otra forma de denominar a la presin arterial elevada. La presin arterial elevada fuerza al corazn a trabajar ms para bombear la sangre. Esto puede causar problemas con el paso del [place]. Una lectura de presin arterial est compuesta por 2 nmeros. Hay un nmero superior (sistlico) sobre un nmero inferior (diastlico). Lo ideal es tener la presin arterial por debajo de 120/80. Cules son las causas? Se desconoce la causa de esta afeccin. Algunas otras afecciones pueden provocar presin arterial elevada. Qu incrementa el riesgo? Algunos factores del estilo de vida pueden hacer que tenga ms probabilidades de desarrollar  presin arterial elevada: Fumar. No hacer la cantidad suficiente de actividad fsica o ejercicio. Tener sobrepeso. Consumir mucha grasa, azcar, caloras o sal (sodio) en su dieta. Beber alcohol en exceso. Otros factores de riesgo son los siguientes: Tener alguna de estas afecciones: Enfermedad cardaca. Diabetes. Colesterol alto. Enfermedad renal. Apnea obstructiva del sueo. Tener antecedentes familiares de presin arterial elevada y colesterol elevado. Edad. El riesgo aumenta con la edad. Estrs. Cules son los signos o sntomas? Es posible que la presin arterial alta no cause sntomas. La presin arterial muy alta (crisis hipertensiva) puede provocar: Dolor de cabeza. Latidos cardacos acelerados o irregulares (palpitaciones). Falta de aire. Hemorragia nasal. Vomitar o sentir ganas de vomitar (nuseas). Cambios en la forma de ver. Dolor muy intenso en el pecho. Sensacin de mareo. Convulsiones. Cmo se trata? Esta afeccin se trata haciendo cambios saludables en el estilo de vida, por ejemplo: Consumir alimentos saludables. Hacer ms ejercicio. Beber menos alcohol. El mdico puede recetarle medicamentos si los cambios en el estilo de vida no son lo suficientemente eficaces y si: El nmero de arriba est por encima de 130. El nmero de abajo est por encima de 80. Su presin arterial personal ideal puede variar. Siga estas indicaciones en su casa: Comida y bebida  Si se lo dicen, siga el plan de alimentacin de DASH (Dietary Approaches to Stop Hypertension, Maneras de alimentarse para detener la hipertensin). Para seguir este plan: Llene la mitad del plato de cada comida con frutas y verduras. Llene un cuarto del plato de cada comida con cereales integrales. Los cereales integrales incluyen pasta integral, arroz integral y pan integral. Coma y beba productos lcteos con bajo contenido de grasa, como leche descremada o yogur bajo en grasas. Llene un cuarto del plato  de cada comida con protenas bajas en grasa (magras). Las protenas bajas en grasa incluyen pescado, pollo sin piel, huevos, frijoles y tofu. Evite consumir carne grasa, carne curada y procesada, o pollo con piel. Evite consumir alimentos prehechos o procesados. Limite la cantidad de sal en su dieta a menos de 1500 mg por da. No beba alcohol si: El mdico le indica que no lo haga. Est embarazada, puede estar embarazada o  est tratando de quedar embarazada. Si bebe alcohol: Limite la cantidad que bebe a lo siguiente: De 0 a 1 medida por da para las mujeres. De 0 a 2 medidas por da para los hombres. Sepa cunta cantidad de alcohol hay en las bebidas que toma. En los [address], una medida equivale a una botella de cerveza de 12 oz (355 ml), un vaso de vino de 5 oz (148 ml) o un vaso de una bebida alcohlica de alta graduacin de 1 oz (44 ml). Estilo de vida  Trabaje con su mdico para mantenerse en un peso saludable o para perder peso. Pregntele a su mdico cul es el peso recomendable para usted. Realice al menos 30 minutos de ejercicio que haga que se acelere su corazn (ejercicio Magazine features editor) la [employer] de la [employer]. Estos pueden incluir caminar, nadar o andar en bicicleta. Realice al menos 30 minutos de ejercicio que fortalezca sus msculos (ejercicios de resistencia) al menos 3 das a la [place]. Estos pueden incluir levantar pesas o hacer Pilates. No fume ni consuma ningn producto que contenga nicotina o tabaco. Si necesita ayuda para dejar de consumir estos productos, consulte al mdico. Controle su presin arterial en su casa tal como le indic el mdico. Concurra a todas las visitas de seguimiento. Medicamentos Use los medicamentos de venta libre y los recetados solamente como se lo haya indicado el mdico. Siga cuidadosamente las indicaciones. No omita las dosis de medicamentos para la presin arterial. Los medicamentos pierden eficacia si omite dosis. El hecho de  omitir las dosis tambin [place] el riesgo de otros problemas. Pregntele a su mdico a qu efectos secundarios o reacciones a los medicamentos debe prestar atencin. Comunquese con un mdico si: Piensa que tiene [place] reaccin a los medicamentos que est tomando. Tiene dolores de cabeza frecuentes. Siente mareos. Tiene hinchazn en los tobillos. Tiene problemas de visin. Solicite ayuda de inmediato si: Siente un dolor de cabeza muy intenso. Empieza a sentirse desorientado (confundido). Se siente dbil o adormecido. Siente que va a desmayarse. Tiene un dolor muy intenso en: Pecho. Vientre (abdomen). Vomita ms de una vez. Tiene dificultad para respirar. Estos sntomas pueden Customer service manager. Solicite ayuda de inmediato. Llame al 911. No espere a ver si los sntomas desaparecen. No conduzca por sus propios medios [employer]. Resumen El trmino hipertensin es otra forma de denominar a la presin arterial elevada. La presin arterial elevada fuerza al corazn a trabajar ms para bombear la sangre. Para la [employer], una presin arterial normal es menor que 120/80. Las decisiones saludables pueden ayudarle a disminuir su presin arterial. Si no puede bajar su presin arterial mediante decisiones saludables, es posible que deba tomar medicamentos. Esta informacin no tiene Theme park manager el consejo del mdico. Asegrese de hacerle al mdico cualquier pregunta que tenga. Document Revised: 06/20/2021 Document Reviewed: 06/20/2021 Elsevier Patient Education  2024 Elsevier Inc.    Maryagnes Small, MD [facility]

## 2023-12-15 NOTE — Assessment & Plan Note (Signed)
 Uncontrolled hypertension Recommend to increase lisinopril  to 40 mg daily Well-controlled diabetes off metformin  Recommend blood work today Cardiovascular risks associated with hypertension and diabetes discussed Diet and nutrition discussed

## 2023-12-15 NOTE — Assessment & Plan Note (Signed)
 Chronic stable condition. Continues atorvastatin  20 mg daily Lipid profile done today. Diet and nutrition discussed The 10-year ASCVD risk score (Arnett DK, et al., 2019) is: 3.2%   Values used to calculate the score:     Age: 58 years     Sex: Female     Is Non-Hispanic African American: No     Diabetic: Yes     Tobacco smoker: No     Systolic Blood Pressure: 132 mmHg     Is BP treated: Yes     HDL Cholesterol: 61.9 mg/dL     Total Cholesterol: 135 mg/dL

## 2023-12-15 NOTE — Patient Instructions (Signed)
Hipertensin en los adultos Hypertension, Adult El trmino hipertensin es otra forma de denominar a la presin arterial elevada. La presin arterial elevada fuerza al corazn a trabajar ms para bombear la sangre. Esto puede causar problemas con el paso del tiempo. Una lectura de presin arterial est compuesta por 2 nmeros. Hay un nmero superior (sistlico) sobre un nmero inferior (diastlico). Lo ideal es tener la presin arterial por debajo de 120/80. Cules son las causas? Se desconoce la causa de esta afeccin. Algunas otras afecciones pueden provocar presin arterial elevada. Qu incrementa el riesgo? Algunos factores del estilo de vida pueden hacer que tenga ms probabilidades de desarrollar presin arterial elevada: Fumar. No hacer la cantidad suficiente de actividad fsica o ejercicio. Tener sobrepeso. Consumir mucha grasa, azcar, caloras o sal (sodio) en su dieta. Beber alcohol en exceso. Otros factores de riesgo son los siguientes: Tener alguna de estas afecciones: Enfermedad cardaca. Diabetes. Colesterol alto. Enfermedad renal. Apnea obstructiva del sueo. Tener antecedentes familiares de presin arterial elevada y colesterol elevado. Edad. El riesgo aumenta con la edad. Estrs. Cules son los signos o sntomas? Es posible que la presin arterial alta no cause sntomas. La presin arterial muy alta (crisis hipertensiva) puede provocar: Dolor de cabeza. Latidos cardacos acelerados o irregulares (palpitaciones). Falta de aire. Hemorragia nasal. Vomitar o sentir ganas de vomitar (nuseas). Cambios en la forma de ver. Dolor muy intenso en el pecho. Sensacin de mareo. Convulsiones. Cmo se trata? Esta afeccin se trata haciendo cambios saludables en el estilo de vida, por ejemplo: Consumir alimentos saludables. Hacer ms ejercicio. Beber menos alcohol. El mdico puede recetarle medicamentos si los cambios en el estilo de vida no son lo suficientemente  eficaces y si: El nmero de arriba est por encima de 130. El nmero de abajo est por encima de 80. Su presin arterial personal ideal puede variar. Siga estas indicaciones en su casa: Comida y bebida  Si se lo dicen, siga el plan de alimentacin de DASH (Dietary Approaches to Stop Hypertension, Maneras de alimentarse para detener la hipertensin). Para seguir este plan: Llene la mitad del plato de cada comida con frutas y verduras. Llene un cuarto del plato de cada comida con cereales integrales. Los cereales integrales incluyen pasta integral, arroz integral y pan integral. Coma y beba productos lcteos con bajo contenido de grasa, como leche descremada o yogur bajo en grasas. Llene un cuarto del plato de cada comida con protenas bajas en grasa (magras). Las protenas bajas en grasa incluyen pescado, pollo sin piel, huevos, frijoles y tofu. Evite consumir carne grasa, carne curada y procesada, o pollo con piel. Evite consumir alimentos prehechos o procesados. Limite la cantidad de sal en su dieta a menos de 1500 mg por da. No beba alcohol si: El mdico le indica que no lo haga. Est embarazada, puede estar embarazada o est tratando de quedar embarazada. Si bebe alcohol: Limite la cantidad que bebe a lo siguiente: De 0 a 1 medida por da para las mujeres. De 0 a 2 medidas por da para los hombres. Sepa cunta cantidad de alcohol hay en las bebidas que toma. En los [place], una medida equivale a una botella de cerveza de 12 oz (355 ml), un vaso de vino de 5 oz (148 ml) o un vaso de una bebida alcohlica de alta graduacin de 1 oz (44 ml). Estilo de vida  Trabaje con su mdico para mantenerse en un peso saludable o para perder peso. Pregntele a su mdico cul es el peso recomendable para   usted. Realice al menos 30 minutos de ejercicio que haga que se acelere su corazn (ejercicio aerbico) la mayora de los das de la semana. Estos pueden incluir caminar, nadar o andar en  bicicleta. Realice al menos 30 minutos de ejercicio que fortalezca sus msculos (ejercicios de resistencia) al menos 3 das a la semana. Estos pueden incluir levantar pesas o hacer Pilates. No fume ni consuma ningn producto que contenga nicotina o tabaco. Si necesita ayuda para dejar de consumir estos productos, consulte al mdico. Controle su presin arterial en su casa tal como le indic el mdico. Concurra a todas las visitas de seguimiento. Medicamentos Use los medicamentos de venta libre y los recetados solamente como se lo haya indicado el mdico. Siga cuidadosamente las indicaciones. No omita las dosis de medicamentos para la presin arterial. Los medicamentos pierden eficacia si omite dosis. El hecho de omitir las dosis tambin aumenta el riesgo de otros problemas. Pregntele a su mdico a qu efectos secundarios o reacciones a los medicamentos debe prestar atencin. Comunquese con un mdico si: Piensa que tiene una reaccin a los medicamentos que est tomando. Tiene dolores de cabeza frecuentes. Siente mareos. Tiene hinchazn en los tobillos. Tiene problemas de visin. Solicite ayuda de inmediato si: Siente un dolor de cabeza muy intenso. Empieza a sentirse desorientado (confundido). Se siente dbil o adormecido. Siente que va a desmayarse. Tiene un dolor muy intenso en: Pecho. Vientre (abdomen). Vomita ms de una vez. Tiene dificultad para respirar. Estos sntomas pueden indicar una emergencia. Solicite ayuda de inmediato. Llame al 911. No espere a ver si los sntomas desaparecen. No conduzca por sus propios medios hasta el hospital. Resumen El trmino hipertensin es otra forma de denominar a la presin arterial elevada. La presin arterial elevada fuerza al corazn a trabajar ms para bombear la sangre. Para la mayora de las personas, una presin arterial normal es menor que 120/80. Las decisiones saludables pueden ayudarle a disminuir su presin arterial. Si no puede  bajar su presin arterial mediante decisiones saludables, es posible que deba tomar medicamentos. Esta informacin no tiene como fin reemplazar el consejo del mdico. Asegrese de hacerle al mdico cualquier pregunta que tenga. Document Revised: 06/20/2021 Document Reviewed: 06/20/2021 Elsevier Patient Education  2024 Elsevier Inc.  

## 2024-01-20 DIAGNOSIS — M7989 Other specified soft tissue disorders: Secondary | ICD-10-CM | POA: Diagnosis not present

## 2024-01-20 DIAGNOSIS — I87393 Chronic venous hypertension (idiopathic) with other complications of bilateral lower extremity: Secondary | ICD-10-CM | POA: Diagnosis not present

## 2024-01-20 DIAGNOSIS — L299 Pruritus, unspecified: Secondary | ICD-10-CM | POA: Diagnosis not present

## 2024-01-20 DIAGNOSIS — I8311 Varicose veins of right lower extremity with inflammation: Secondary | ICD-10-CM | POA: Diagnosis not present

## 2024-01-20 DIAGNOSIS — R600 Localized edema: Secondary | ICD-10-CM | POA: Diagnosis not present

## 2024-01-20 DIAGNOSIS — I8312 Varicose veins of left lower extremity with inflammation: Secondary | ICD-10-CM | POA: Diagnosis not present

## 2024-01-26 ENCOUNTER — Other Ambulatory Visit: Payer: Self-pay | Admitting: Emergency Medicine

## 2024-01-26 DIAGNOSIS — E785 Hyperlipidemia, unspecified: Secondary | ICD-10-CM

## 2024-02-23 ENCOUNTER — Encounter: Admitting: Emergency Medicine

## 2024-04-26 ENCOUNTER — Other Ambulatory Visit: Payer: Self-pay | Admitting: Emergency Medicine

## 2024-04-26 DIAGNOSIS — E785 Hyperlipidemia, unspecified: Secondary | ICD-10-CM

## 2024-07-26 ENCOUNTER — Other Ambulatory Visit: Payer: Self-pay | Admitting: Emergency Medicine

## 2024-07-26 DIAGNOSIS — R600 Localized edema: Secondary | ICD-10-CM | POA: Diagnosis not present

## 2024-07-26 DIAGNOSIS — I872 Venous insufficiency (chronic) (peripheral): Secondary | ICD-10-CM | POA: Diagnosis not present

## 2024-07-26 DIAGNOSIS — I8311 Varicose veins of right lower extremity with inflammation: Secondary | ICD-10-CM | POA: Diagnosis not present

## 2024-07-26 DIAGNOSIS — I8312 Varicose veins of left lower extremity with inflammation: Secondary | ICD-10-CM | POA: Diagnosis not present

## 2024-07-26 DIAGNOSIS — E785 Hyperlipidemia, unspecified: Secondary | ICD-10-CM

## 2024-07-26 DIAGNOSIS — I83892 Varicose veins of left lower extremities with other complications: Secondary | ICD-10-CM | POA: Diagnosis not present

## 2024-08-23 DIAGNOSIS — I83892 Varicose veins of left lower extremities with other complications: Secondary | ICD-10-CM | POA: Diagnosis not present
